# Patient Record
Sex: Female | Born: 1960 | Race: Black or African American | Hispanic: No | Marital: Single | Smoking: Former smoker
Health system: Southern US, Community
[De-identification: ages and names within clinical notes are randomized; demographics above are authoritative.]

## PROBLEM LIST (undated history)

## (undated) DIAGNOSIS — M545 Low back pain, unspecified: Secondary | ICD-10-CM

## (undated) DIAGNOSIS — J45909 Unspecified asthma, uncomplicated: Secondary | ICD-10-CM

## (undated) DIAGNOSIS — K759 Inflammatory liver disease, unspecified: Secondary | ICD-10-CM

## (undated) DIAGNOSIS — I428 Other cardiomyopathies: Secondary | ICD-10-CM

## (undated) DIAGNOSIS — G8929 Other chronic pain: Secondary | ICD-10-CM

## (undated) DIAGNOSIS — B1920 Unspecified viral hepatitis C without hepatic coma: Secondary | ICD-10-CM

## (undated) DIAGNOSIS — E538 Deficiency of other specified B group vitamins: Secondary | ICD-10-CM

## (undated) DIAGNOSIS — R7303 Prediabetes: Secondary | ICD-10-CM

## (undated) DIAGNOSIS — Z6835 Body mass index (BMI) 35.0-35.9, adult: Secondary | ICD-10-CM

## (undated) DIAGNOSIS — K219 Gastro-esophageal reflux disease without esophagitis: Secondary | ICD-10-CM

## (undated) DIAGNOSIS — R634 Abnormal weight loss: Secondary | ICD-10-CM

## (undated) DIAGNOSIS — E559 Vitamin D deficiency, unspecified: Secondary | ICD-10-CM

## (undated) DIAGNOSIS — S300XXA Contusion of lower back and pelvis, initial encounter: Secondary | ICD-10-CM

## (undated) DIAGNOSIS — F419 Anxiety disorder, unspecified: Secondary | ICD-10-CM

## (undated) DIAGNOSIS — I509 Heart failure, unspecified: Secondary | ICD-10-CM

## (undated) DIAGNOSIS — F1911 Other psychoactive substance abuse, in remission: Secondary | ICD-10-CM

## (undated) DIAGNOSIS — I10 Essential (primary) hypertension: Secondary | ICD-10-CM

## (undated) DIAGNOSIS — F99 Mental disorder, not otherwise specified: Secondary | ICD-10-CM

## (undated) DIAGNOSIS — R7989 Other specified abnormal findings of blood chemistry: Secondary | ICD-10-CM

## (undated) DIAGNOSIS — R5381 Other malaise: Secondary | ICD-10-CM

## (undated) DIAGNOSIS — R296 Repeated falls: Secondary | ICD-10-CM

## (undated) DIAGNOSIS — F209 Schizophrenia, unspecified: Secondary | ICD-10-CM

## (undated) DIAGNOSIS — J449 Chronic obstructive pulmonary disease, unspecified: Secondary | ICD-10-CM

## (undated) HISTORY — PX: TUBAL LIGATION: SHX77

## (undated) HISTORY — PX: OTHER SURGICAL HISTORY: SHX169

## (undated) HISTORY — PX: COLONOSCOPY: SHX174

---

## 2013-10-30 ENCOUNTER — Inpatient Hospital Stay: Payer: Self-pay | Admitting: Psychiatry

## 2013-10-30 LAB — URINALYSIS, COMPLETE
Bilirubin,UR: NEGATIVE
Glucose,UR: NEGATIVE mg/dL (ref 0–75)
Ketone: NEGATIVE
Nitrite: NEGATIVE
Ph: 7 (ref 4.5–8.0)
Protein: NEGATIVE
RBC,UR: 1 /HPF (ref 0–5)
Specific Gravity: 1.006 (ref 1.003–1.030)
Squamous Epithelial: 6
WBC UR: 7 /HPF (ref 0–5)

## 2013-10-30 LAB — COMPREHENSIVE METABOLIC PANEL
Albumin: 4.2 g/dL (ref 3.4–5.0)
Alkaline Phosphatase: 80 U/L
Anion Gap: 5 — ABNORMAL LOW (ref 7–16)
BUN: 10 mg/dL (ref 7–18)
Bilirubin,Total: 0.5 mg/dL (ref 0.2–1.0)
Calcium, Total: 9.9 mg/dL (ref 8.5–10.1)
Chloride: 106 mmol/L (ref 98–107)
Co2: 26 mmol/L (ref 21–32)
Creatinine: 0.66 mg/dL (ref 0.60–1.30)
EGFR (African American): >60
EGFR (Non-African Amer.): >60
Glucose: 105 mg/dL — ABNORMAL HIGH (ref 65–99)
Osmolality: 273 (ref 275–301)
Potassium: 3.9 mmol/L (ref 3.5–5.1)
SGOT(AST): 31 U/L (ref 15–37)
SGPT (ALT): 32 U/L (ref 12–78)
Sodium: 137 mmol/L (ref 136–145)
Total Protein: 8 g/dL (ref 6.4–8.2)

## 2013-10-30 LAB — DRUG SCREEN, URINE
Barbiturates, Ur Screen: NEGATIVE (ref ?–200)
MDMA (Ecstasy)Ur Screen: NEGATIVE (ref ?–500)
Methadone, Ur Screen: NEGATIVE (ref ?–300)
Opiate, Ur Screen: NEGATIVE (ref ?–300)
Phencyclidine (PCP) Ur S: NEGATIVE (ref ?–25)
Tricyclic, Ur Screen: NEGATIVE (ref ?–1000)

## 2013-10-30 LAB — CBC
HCT: 42.3 % (ref 35.0–47.0)
HGB: 13.8 g/dL (ref 12.0–16.0)
MCH: 28.7 pg (ref 26.0–34.0)
MCHC: 32.6 g/dL (ref 32.0–36.0)
MCV: 88 fL (ref 80–100)
Platelet: 274 10*3/uL (ref 150–440)
RBC: 4.82 10*6/uL (ref 3.80–5.20)
RDW: 14.4 % (ref 11.5–14.5)
WBC: 12.6 10*3/uL — ABNORMAL HIGH (ref 3.6–11.0)

## 2013-10-30 LAB — SALICYLATE LEVEL: Salicylates, Serum: 3.4 mg/dL — ABNORMAL HIGH

## 2013-10-30 LAB — ETHANOL: Ethanol: <3 mg/dL

## 2013-10-30 LAB — ACETAMINOPHEN LEVEL: Acetaminophen: <2 ug/mL

## 2015-02-28 NOTE — Consult Note (Signed)
Brief Consult Note: Diagnosis: schizophrenia.   Patient was seen by consultant.   Consult note dictated.   Orders entered.   Comments: Psychiatry: PAtient seen chart reviewed and full note dictated. Admitted to [facility] for symptom stabilization after recent stress produced SI and HI. Orders done.  Electronic Signatures: Octavia Velador, Madie Reno (MD)  (Signed 24-Dec-14 15:54)  Authored: Brief Consult Note   Last Updated: 24-Dec-14 15:54 by Gonzella Lex (MD)

## 2015-02-28 NOTE — Discharge Summary (Signed)
PATIENT NAME:  Gloria Perez, Gloria Perez MR#:  [id number] DATE OF BIRTH:  1961-10-30  DATE OF ADMISSION:  10/30/2013 DATE OF DISCHARGE:  11/05/2013  HOSPITAL COURSE: See dictated history and physical for details of admission. This 54 year old woman with a history of schizophrenia came into the hospital reporting depressed mood and suicidal ideation and worsening hallucinations, all of which seemed to be related to the stress of her boyfriend breaking up with her. In the hospital, she initially was very withdrawn, did not go to groups much, did not interact much, cried a lot, complained of hallucinations a lot. She has been continued on her outpatient psychiatric medicines. Supportive and educational therapy have been done. The patient has shown gradual improvement and, for the last couple of days now, she denies any suicidal ideation and reports that her hallucinations are better. She is agreeable to going back to her group home. We really did not have to change anything about her medication significantly. Blood pressure has been stable. The patient will be followed up by the [place] team still back in the community.   MENTAL STATUS EXAM AT DISCHARGE: Somewhat disheveled but better than when she came in woman who looks her stated age or younger. Eye contact normal. Psychomotor activity improved. Speech decreased in total amount but still better than it was a couple of days ago. Affect is smiling and upbeat. Mood is stated as okay. Thoughts are slow and slightly disorganized but not grossly bizarre. No obvious delusions. Denies suicidal or homicidal ideation. Shows improved insight and judgment. Intelligence probably chronically impaired. Alert and oriented x 4, however.   DISCHARGE MEDICATIONS: Lisinopril 20 mg once a day, promethazine 25 mg every 4 to 6 hours as needed for nausea, benztropine 1 mg twice a day, fluphenazine 10 mg once a day at night and Toprol 40 mg once a day.   LABORATORY RESULTS: Blood  glucoses have been monitored throughout her hospital stay and have largely been in the low to mid 100s. She had reported to me very clearly that she was no longer supposed to be taking her diabetes medicine. Salicylates elevated slightly at 3.4 on admission. Alcohol negative. Chemistry panel elevated glucose 105. CBC elevated white count at 12.6. Urinalysis likely urinary tract infection which was treated during her hospital stay. Drug screen negative.   DISPOSITION: Discharged back to her group home. Follow up with the [place] team.   DIAGNOSIS, PRINCIPAL AND PRIMARY:  AXIS I: Schizophrenia, undifferentiated.   SECONDARY DIAGNOSES: AXIS I: No further diagnosis.  AXIS II: Deferred.  AXIS III: High blood pressure, history of diabetes, currently off medication; obesity, urinary tract infection, improved; gastric reflux symptoms.  AXIS IV: Severe from chronic illness and recent breakup.  AXIS V: Functioning at time of discharge 83.    ____________________________ Gonzella Lex, MD jtc:cs D: 11/05/2013 14:35:59 ET T: 11/05/2013 15:25:05 ET JOB#: [id number]  cc: Gonzella Lex, MD, <Dictator> Gonzella Lex MD ELECTRONICALLY SIGNED 11/06/2013 14:12
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
## 2017-08-17 ENCOUNTER — Encounter (INDEPENDENT_AMBULATORY_CARE_PROVIDER_SITE_OTHER): Payer: Self-pay | Admitting: *Deleted

## 2017-08-17 ENCOUNTER — Other Ambulatory Visit (HOSPITAL_COMMUNITY)
Admission: RE | Admit: 2017-08-17 | Discharge: 2017-08-17 | Disposition: A | Discharge status: Home / Self Care | Payer: Medicare (Managed Care) | Source: Ambulatory Visit | Attending: Internal Medicine | Admitting: Internal Medicine

## 2017-08-17 DIAGNOSIS — F319 Bipolar disorder, unspecified: Secondary | ICD-10-CM | POA: Insufficient documentation

## 2017-08-17 DIAGNOSIS — E1121 Type 2 diabetes mellitus with diabetic nephropathy: Secondary | ICD-10-CM | POA: Insufficient documentation

## 2017-08-17 DIAGNOSIS — E782 Mixed hyperlipidemia: Secondary | ICD-10-CM | POA: Insufficient documentation

## 2017-08-17 LAB — CBC WITH DIFFERENTIAL/PLATELET
Basophils Absolute: 0 10*3/uL (ref 0.0–0.1)
Basophils Relative: 0 %
Eosinophils Absolute: 0.3 10*3/uL (ref 0.0–0.7)
Eosinophils Relative: 2 %
HEMATOCRIT: 42.7 % (ref 36.0–46.0)
HEMOGLOBIN: 14.7 g/dL (ref 12.0–15.0)
LYMPHS ABS: 3.3 10*3/uL (ref 0.7–4.0)
Lymphocytes Relative: 28 %
MCH: 30.9 pg (ref 26.0–34.0)
MCHC: 34.4 g/dL (ref 30.0–36.0)
MCV: 89.9 fL (ref 78.0–100.0)
MONOS PCT: 7 %
Monocytes Absolute: 0.9 10*3/uL (ref 0.1–1.0)
NEUTROS ABS: 7.4 10*3/uL (ref 1.7–7.7)
Neutrophils Relative %: 63 %
Platelets: 268 10*3/uL (ref 150–400)
RBC: 4.75 MIL/uL (ref 3.87–5.11)
RDW: 14.3 % (ref 11.5–15.5)
WBC: 11.8 10*3/uL — ABNORMAL HIGH (ref 4.0–10.5)

## 2017-08-17 LAB — LIPID PANEL
Cholesterol: 121 mg/dL (ref 0–200)
HDL: 48 mg/dL (ref >40)
LDL CALC: 58 mg/dL (ref 0–99)
Total CHOL/HDL Ratio: 2.5 RATIO
Triglycerides: 74 mg/dL (ref <150)
VLDL: 15 mg/dL (ref 0–40)

## 2017-08-17 LAB — COMPREHENSIVE METABOLIC PANEL
ALBUMIN: 4.7 g/dL (ref 3.5–5.0)
ALT: 39 U/L (ref 14–54)
ANION GAP: 15 (ref 5–15)
AST: 45 U/L — ABNORMAL HIGH (ref 15–41)
Alkaline Phosphatase: 76 U/L (ref 38–126)
BUN: 14 mg/dL (ref 6–20)
CHLORIDE: 95 mmol/L — AB (ref 101–111)
CO2: 26 mmol/L (ref 22–32)
Calcium: 9.6 mg/dL (ref 8.9–10.3)
Creatinine, Ser: 0.87 mg/dL (ref 0.44–1.00)
GFR calc non Af Amer: >60 mL/min (ref >60)
GLUCOSE: 125 mg/dL — AB (ref 65–99)
Potassium: 3.3 mmol/L — ABNORMAL LOW (ref 3.5–5.1)
SODIUM: 136 mmol/L (ref 135–145)
Total Bilirubin: 1 mg/dL (ref 0.3–1.2)
Total Protein: 8.7 g/dL — ABNORMAL HIGH (ref 6.5–8.1)

## 2017-08-18 LAB — VITAMIN D 25 HYDROXY (VIT D DEFICIENCY, FRACTURES): Vit D, 25-Hydroxy: 64.4 ng/mL (ref 30.0–100.0)

## 2017-09-01 ENCOUNTER — Ambulatory Visit (HOSPITAL_COMMUNITY): Payer: Medicare (Managed Care)

## 2017-09-01 ENCOUNTER — Other Ambulatory Visit (HOSPITAL_COMMUNITY): Payer: Self-pay | Admitting: Family Medicine

## 2017-09-01 DIAGNOSIS — Z1231 Encounter for screening mammogram for malignant neoplasm of breast: Secondary | ICD-10-CM

## 2017-09-19 ENCOUNTER — Ambulatory Visit (HOSPITAL_COMMUNITY): Payer: Medicare (Managed Care)

## 2017-09-22 ENCOUNTER — Ambulatory Visit (HOSPITAL_COMMUNITY): Payer: Medicare (Managed Care)

## 2017-12-29 ENCOUNTER — Other Ambulatory Visit (HOSPITAL_COMMUNITY): Payer: Self-pay | Admitting: Family Medicine

## 2017-12-29 DIAGNOSIS — Z1239 Encounter for other screening for malignant neoplasm of breast: Secondary | ICD-10-CM

## 2018-02-14 ENCOUNTER — Other Ambulatory Visit (HOSPITAL_COMMUNITY): Payer: Self-pay | Admitting: Family Medicine

## 2018-02-14 DIAGNOSIS — B1920 Unspecified viral hepatitis C without hepatic coma: Secondary | ICD-10-CM

## 2018-02-21 ENCOUNTER — Encounter: Payer: Self-pay | Admitting: Emergency Medicine

## 2018-02-22 ENCOUNTER — Ambulatory Visit
Admission: RE | Admit: 2018-02-22 | Discharge: 2018-02-22 | Disposition: A | Discharge status: Home / Self Care | Payer: Medicare (Managed Care) | Source: Ambulatory Visit | Attending: Internal Medicine | Admitting: Internal Medicine

## 2018-02-22 ENCOUNTER — Encounter: Payer: Self-pay | Admitting: *Deleted

## 2018-02-22 ENCOUNTER — Ambulatory Visit: Payer: Medicare (Managed Care) | Admitting: Anesthesiology

## 2018-02-22 ENCOUNTER — Encounter
Admission: RE | Disposition: A | Discharge status: Home / Self Care | Payer: Self-pay | Source: Ambulatory Visit | Attending: Internal Medicine

## 2018-02-22 DIAGNOSIS — Z7951 Long term (current) use of inhaled steroids: Secondary | ICD-10-CM | POA: Diagnosis not present

## 2018-02-22 DIAGNOSIS — I5030 Unspecified diastolic (congestive) heart failure: Secondary | ICD-10-CM | POA: Diagnosis not present

## 2018-02-22 DIAGNOSIS — Z791 Long term (current) use of non-steroidal anti-inflammatories (NSAID): Secondary | ICD-10-CM | POA: Diagnosis not present

## 2018-02-22 DIAGNOSIS — Z1211 Encounter for screening for malignant neoplasm of colon: Secondary | ICD-10-CM | POA: Insufficient documentation

## 2018-02-22 DIAGNOSIS — Z538 Procedure and treatment not carried out for other reasons: Secondary | ICD-10-CM | POA: Insufficient documentation

## 2018-02-22 DIAGNOSIS — J449 Chronic obstructive pulmonary disease, unspecified: Secondary | ICD-10-CM | POA: Diagnosis not present

## 2018-02-22 DIAGNOSIS — I110 Hypertensive heart disease with heart failure: Secondary | ICD-10-CM | POA: Insufficient documentation

## 2018-02-22 DIAGNOSIS — F17200 Nicotine dependence, unspecified, uncomplicated: Secondary | ICD-10-CM | POA: Insufficient documentation

## 2018-02-22 DIAGNOSIS — K219 Gastro-esophageal reflux disease without esophagitis: Secondary | ICD-10-CM | POA: Diagnosis not present

## 2018-02-22 DIAGNOSIS — Z79899 Other long term (current) drug therapy: Secondary | ICD-10-CM | POA: Diagnosis not present

## 2018-02-22 HISTORY — DX: Prediabetes: R73.03

## 2018-02-22 HISTORY — DX: Other cardiomyopathies: I42.8

## 2018-02-22 HISTORY — DX: Unspecified asthma, uncomplicated: J45.909

## 2018-02-22 HISTORY — DX: Schizophrenia, unspecified: F20.9

## 2018-02-22 HISTORY — DX: Vitamin D deficiency, unspecified: E55.9

## 2018-02-22 HISTORY — DX: Repeated falls: R29.6

## 2018-02-22 HISTORY — DX: Other malaise: R53.81

## 2018-02-22 HISTORY — DX: Morbid (severe) obesity due to excess calories: E66.01

## 2018-02-22 HISTORY — DX: Deficiency of other specified B group vitamins: E53.8

## 2018-02-22 HISTORY — DX: Contusion of lower back and pelvis, initial encounter: S30.0XXA

## 2018-02-22 HISTORY — DX: Gastro-esophageal reflux disease without esophagitis: K21.9

## 2018-02-22 HISTORY — DX: Low back pain, unspecified: M54.50

## 2018-02-22 HISTORY — DX: Other chronic pain: G89.29

## 2018-02-22 HISTORY — DX: Low back pain: M54.5

## 2018-02-22 HISTORY — DX: Heart failure, unspecified: I50.9

## 2018-02-22 HISTORY — PX: COLONOSCOPY WITH PROPOFOL: SHX5780

## 2018-02-22 HISTORY — DX: Other specified abnormal findings of blood chemistry: R79.89

## 2018-02-22 HISTORY — DX: Inflammatory liver disease, unspecified: K75.9

## 2018-02-22 HISTORY — DX: Chronic obstructive pulmonary disease, unspecified: J44.9

## 2018-02-22 HISTORY — DX: Essential (primary) hypertension: I10

## 2018-02-22 HISTORY — DX: Body mass index (BMI) 35.0-35.9, adult: Z68.35

## 2018-02-22 HISTORY — DX: Abnormal weight loss: R63.4

## 2018-02-22 SURGERY — COLONOSCOPY WITH PROPOFOL
Anesthesia: General

## 2018-02-22 MED ORDER — LIDOCAINE HCL (PF) 2 % IJ SOLN
INTRAMUSCULAR | Status: AC
  Filled 2018-02-22: qty 10

## 2018-02-22 MED ORDER — SODIUM CHLORIDE 0.9 % IV SOLN
INTRAVENOUS | Status: DC
  Administered 2018-02-22: 1000 mL via INTRAVENOUS

## 2018-02-22 MED ORDER — PROPOFOL 500 MG/50ML IV EMUL
INTRAVENOUS | Status: AC
  Filled 2018-02-22: qty 50

## 2018-02-22 NOTE — Anesthesia Postprocedure Evaluation (Signed)
Anesthesia Post Note  Patient: Gloria Perez  Procedure(s) Performed: COLONOSCOPY WITH PROPOFOL (N/A )  Patient location during evaluation: Endoscopy Anesthesia Type: General Level of consciousness: awake and alert and oriented Pain management: pain level controlled Vital Signs Assessment: post-procedure vital signs reviewed and stable Respiratory status: spontaneous breathing, nonlabored ventilation and respiratory function stable Cardiovascular status: blood pressure returned to baseline and stable Postop Assessment: no signs of nausea or vomiting Anesthetic complications: no     Last Vitals:  Vitals:   02/22/18 1236 02/22/18 1327  BP: 96/81 122/68  Pulse: 99 90  Resp: 18 16  Temp: (!) 36.2 C   SpO2: 100% 100%    Last Pain:  Vitals:   02/22/18 1236  TempSrc: Tympanic  PainSc: 0-No pain                 Shanora Christensen

## 2018-02-22 NOTE — Anesthesia Preprocedure Evaluation (Addendum)
Anesthesia Evaluation  Patient identified by MRN, date of birth, ID band Patient awake    Reviewed: Allergy & Precautions, NPO status , Patient's Chart, lab work & pertinent test results  History of Anesthesia Complications Negative for: history of anesthetic complications  Airway Mallampati: II  TM Distance: >3 FB Neck ROM: Full    Dental  (+) Edentulous Upper, Edentulous Lower   Pulmonary asthma , COPD, Current Smoker,    breath sounds clear to auscultation- rhonchi (-) wheezing      Cardiovascular hypertension, Pt. on medications +CHF  (-) CAD, (-) Past MI, (-) Cardiac Stents and (-) CABG  Rhythm:Regular Rate:Normal - Systolic murmurs and - Diastolic murmurs Echo 32/35/57: MILD LV DYSFUNCTION (EF 50%) WITH MILD LVH NORMAL LA PRESSURES WITH DIASTOLIC DYSFUNCTION NORMAL RIGHT VENTRICULAR SYSTOLIC FUNCTION VALVULAR REGURGITATION: MILD AR, MILD MR, MILD PR, TRIVIAL TR NO VALVULAR STENOSIS   Neuro/Psych PSYCHIATRIC DISORDERS Schizophrenia negative neurological ROS     GI/Hepatic Neg liver ROS, GERD  ,  Endo/Other  negative endocrine ROSneg diabetes  Renal/GU negative Renal ROS     Musculoskeletal negative musculoskeletal ROS (+)   Abdominal (+) - obese,   Peds  Hematology negative hematology ROS (+)   Anesthesia Other Findings Past Medical History: No date: Abnormal thyroid blood test No date: Acute left-sided low back pain without sciatica No date: Asthma No date: B12 deficiency No date: CHF (congestive heart failure) (HCC) No date: Chronic pain No date: Contusion of lower back No date: COPD (chronic obstructive pulmonary disease) (HCC) No date: Falling episodes No date: GERD (gastroesophageal reflux disease) No date: Hepatitis No date: Hypertension No date: Nonischemic cardiomyopathy (HCC) No date: Physical debility No date: Pre-diabetes No date: Schizophrenia ([place]) No date: Severe obesity  (BMI 35.0-35.9 with comorbidity) ([place]) No date: Vitamin D deficiency No date: Weight loss   Reproductive/Obstetrics                            Anesthesia Physical Anesthesia Plan  ASA: III  Anesthesia Plan: General   Post-op Pain Management:    Induction: Intravenous  PONV Risk Score and Plan: 1 and Propofol infusion  Airway Management Planned: Natural Airway  Additional Equipment:   Intra-op Plan:   Post-operative Plan:   Informed Consent: I have reviewed the patients History and Physical, chart, labs and discussed the procedure including the risks, benefits and alternatives for the proposed anesthesia with the patient or authorized representative who has indicated his/her understanding and acceptance.   Dental advisory given  Plan Discussed with: CRNA and Anesthesiologist  Anesthesia Plan Comments:         Anesthesia Quick Evaluation

## 2018-02-22 NOTE — Anesthesia Post-op Follow-up Note (Signed)
Anesthesia QCDR form completed.        

## 2018-02-22 NOTE — Interval H&P Note (Signed)
History and Physical Interval Note:  02/22/2018 12:59 PM  Gloria Perez  has presented today for surgery, with the diagnosis of CCA SCREEN  The various methods of treatment have been discussed with the patient and family. After consideration of risks, benefits and other options for treatment, the patient has consented to  Procedure(s): COLONOSCOPY WITH PROPOFOL (N/A) as a surgical intervention .  The patient's history has been reviewed, patient examined, no change in status, stable for surgery.  I have reviewed the patient's chart and labs.  Questions were answered to the patient's satisfaction.     [place], [place]

## 2018-02-22 NOTE — Progress Notes (Addendum)
No recovery needed for Colonoscopy due to no sedation or anesthesia. Patient not cleaned out for colonoscopy. Spoke with Clarene Critchley on phone and she will come to pick her up.

## 2018-02-22 NOTE — H&P (Signed)
Outpatient short stay form Pre-procedure 02/22/2018 12:58 PM Divit Stipp K. Alice Reichert, M.D.  Primary Physician: Curtis Sites, M.D.  Reason for visit:  Colon cancer screening.  History of present illness:  Patient presents for colonoscopy for screening. The patient denies complaints of abdominal pain, significant change in bowel habits, or rectal bleeding.     Current Facility-Administered Medications:  .  0.9 %  sodium chloride infusion, , Intravenous, Continuous, Charlissa Petros, Benay Pike, MD  Medications Prior to Admission  Medication Sig Dispense Refill Last Dose  . atorvastatin (LIPITOR) 40 MG tablet Take 40 mg by mouth daily.     . benztropine (COGENTIN) 0.5 MG tablet Take 0.5 mg by mouth 2 (two) times daily.   02/22/2018 at Unknown time  . buPROPion (ZYBAN) 150 MG 12 hr tablet Take 150 mg by mouth 2 (two) times daily.   02/22/2018 at Unknown time  . cholecalciferol (VITAMIN D) 400 units TABS tablet Take 1,000 Units by mouth daily.   02/22/2018 at Unknown time  . dicyclomine (BENTYL) 20 MG tablet Take 20 mg by mouth every 6 (six) hours.     . DULoxetine (CYMBALTA) 30 MG capsule Take 30 mg by mouth daily.     Marland Kitchen FLUoxetine (PROZAC) 10 MG capsule Take 10 mg by mouth daily.   02/22/2018 at Unknown time  . fluPHENAZine decanoate (PROLIXIN) 25 MG/ML injection Inject 25 mg into the muscle every 21 ( twenty-one) days.     . fluticasone (FLOVENT HFA) 110 MCG/ACT inhaler Inhale 1 puff into the lungs 2 (two) times daily.     . furosemide (LASIX) 40 MG tablet Take 40 mg by mouth daily.     Marland Kitchen gabapentin (NEURONTIN) 300 MG capsule Take 300 mg by mouth 3 (three) times daily.     . hydrOXYzine (ATARAX/VISTARIL) 25 MG tablet Take 25 mg by mouth 3 (three) times daily as needed.     . lamoTRIgine (LAMICTAL) 25 MG tablet Take 25 mg by mouth 2 (two) times daily.   02/22/2018 at Unknown time  . lisinopril (PRINIVIL,ZESTRIL) 5 MG tablet Take 5 mg by mouth daily.   02/22/2018 at Unknown time  . naproxen (NAPROSYN) 500  MG tablet Take 500 mg by mouth 2 (two) times daily with a meal.     . omeprazole (PRILOSEC) 20 MG capsule Take 20 mg by mouth daily.   02/22/2018 at Unknown time  . ondansetron (ZOFRAN) 4 MG tablet Take 4 mg by mouth every 8 (eight) hours as needed for nausea or vomiting.     . polyethylene glycol (MIRALAX / GLYCOLAX) packet Take 17 g by mouth daily.     Marland Kitchen spironolactone (ALDACTONE) 50 MG tablet Take 50 mg by mouth daily.     Marland Kitchen tiotropium (SPIRIVA) 18 MCG inhalation capsule Place 18 mcg into inhaler and inhale daily.   02/22/2018 at Unknown time  . traZODone (DESYREL) 50 MG tablet Take 50 mg by mouth at bedtime.   02/22/2018 at Unknown time     Allergies  Allergen Reactions  . Lisinopril   . Metformin And Related      Past Medical History:  Diagnosis Date  . Abnormal thyroid blood test   . Acute left-sided low back pain without sciatica   . Asthma   . B12 deficiency   . CHF (congestive heart failure) ([place])   . Chronic pain   . Contusion of lower back   . COPD (chronic obstructive pulmonary disease) ([place])   . Falling episodes   . GERD (gastroesophageal reflux  disease)   . Hepatitis   . Hypertension   . Nonischemic cardiomyopathy ([place])   . Physical debility   . Pre-diabetes   . Schizophrenia ([place])   . Severe obesity (BMI 35.0-35.9 with comorbidity) ([place])   . Vitamin D deficiency   . Weight loss     Review of systems:   Otherwise negative.    Physical Exam  Gen: Alert, oriented. Appears stated age.  HEENT: [place]/AT. PERRLA. Lungs: CTA, no wheezes. CV: RR nl S1, S2. Abd: soft, benign, no masses. BS+ Ext: No edema. Pulses 2+    Planned procedures: Colonoscopy. The patient understands the nature of the planned procedure, indications, risks, alternatives and potential complications including but not limited to bleeding, infection, perforation, damage to internal organs and possible oversedation/side effects from anesthesia. The patient agrees and gives consent to proceed.   Please refer to procedure notes for findings, recommendations and patient disposition/instructions.    Dora Clauss K. Alice Reichert, M.D. Gastroenterology 02/22/2018  12:58 PM

## 2018-02-22 NOTE — Transfer of Care (Signed)
Immediate Anesthesia Transfer of Care Note  Patient: Gloria Perez  Procedure(s) Performed: COLONOSCOPY WITH PROPOFOL (N/A )  Patient Location: PACU  Anesthesia Type:General  Level of Consciousness: awake, alert , oriented and patient cooperative  Airway & Oxygen Therapy: Patient Spontanous Breathing  Post-op Assessment: Report given to RN, Post -op Vital signs reviewed and stable and Patient moving all extremities  Post vital signs: Reviewed and stable  Last Vitals:  Vitals Value Taken Time  BP    Temp    Pulse    Resp    SpO2      Last Pain:  Vitals:   02/22/18 1236  TempSrc: Tympanic  PainSc: 0-No pain         Complications: No apparent anesthesia complications and Procedure cancelled d/t poor prep

## 2018-03-16 ENCOUNTER — Other Ambulatory Visit: Payer: Self-pay | Admitting: Family Medicine

## 2018-03-16 DIAGNOSIS — B1920 Unspecified viral hepatitis C without hepatic coma: Secondary | ICD-10-CM

## 2018-03-20 ENCOUNTER — Encounter: Payer: Self-pay | Admitting: Radiology

## 2018-03-20 ENCOUNTER — Ambulatory Visit
Admission: RE | Admit: 2018-03-20 | Discharge: 2018-03-20 | Disposition: A | Discharge status: Home / Self Care | Payer: PRIVATE HEALTH INSURANCE | Source: Ambulatory Visit | Attending: Family Medicine | Admitting: Family Medicine

## 2018-03-20 DIAGNOSIS — Z1231 Encounter for screening mammogram for malignant neoplasm of breast: Secondary | ICD-10-CM | POA: Diagnosis present

## 2018-03-20 DIAGNOSIS — Z1239 Encounter for other screening for malignant neoplasm of breast: Secondary | ICD-10-CM

## 2018-03-20 DIAGNOSIS — B1920 Unspecified viral hepatitis C without hepatic coma: Secondary | ICD-10-CM

## 2018-03-21 ENCOUNTER — Ambulatory Visit
Admission: RE | Admit: 2018-03-21 | Discharge: 2018-03-21 | Disposition: A | Discharge status: Home / Self Care | Payer: Medicare (Managed Care) | Source: Ambulatory Visit | Attending: Family Medicine | Admitting: Family Medicine

## 2018-03-21 DIAGNOSIS — N2889 Other specified disorders of kidney and ureter: Secondary | ICD-10-CM | POA: Insufficient documentation

## 2018-03-21 DIAGNOSIS — B1920 Unspecified viral hepatitis C without hepatic coma: Secondary | ICD-10-CM | POA: Diagnosis not present

## 2018-03-28 ENCOUNTER — Other Ambulatory Visit: Payer: Self-pay | Admitting: Family Medicine

## 2018-03-28 ENCOUNTER — Ambulatory Visit
Admission: RE | Admit: 2018-03-28 | Payer: Medicare (Managed Care) | Source: Ambulatory Visit | Admitting: Internal Medicine

## 2018-03-28 ENCOUNTER — Encounter: Admission: RE | Payer: Self-pay | Source: Ambulatory Visit

## 2018-03-28 DIAGNOSIS — N2889 Other specified disorders of kidney and ureter: Secondary | ICD-10-CM

## 2018-03-28 SURGERY — COLONOSCOPY WITH PROPOFOL
Anesthesia: General

## 2018-04-05 ENCOUNTER — Ambulatory Visit
Admission: RE | Admit: 2018-04-05 | Discharge: 2018-04-05 | Disposition: A | Discharge status: Home / Self Care | Payer: Medicare (Managed Care) | Source: Ambulatory Visit | Attending: Family Medicine | Admitting: Family Medicine

## 2018-04-05 ENCOUNTER — Other Ambulatory Visit: Payer: Self-pay

## 2018-04-05 ENCOUNTER — Emergency Department
Admission: EM | Admit: 2018-04-05 | Discharge: 2018-04-05 | Disposition: A | Discharge status: Home / Self Care | Payer: Medicare (Managed Care) | Attending: Emergency Medicine | Admitting: Emergency Medicine

## 2018-04-05 DIAGNOSIS — F203 Undifferentiated schizophrenia: Secondary | ICD-10-CM

## 2018-04-05 DIAGNOSIS — I509 Heart failure, unspecified: Secondary | ICD-10-CM | POA: Diagnosis not present

## 2018-04-05 DIAGNOSIS — Z7984 Long term (current) use of oral hypoglycemic drugs: Secondary | ICD-10-CM | POA: Diagnosis not present

## 2018-04-05 DIAGNOSIS — F209 Schizophrenia, unspecified: Secondary | ICD-10-CM

## 2018-04-05 DIAGNOSIS — N2889 Other specified disorders of kidney and ureter: Secondary | ICD-10-CM | POA: Insufficient documentation

## 2018-04-05 DIAGNOSIS — D259 Leiomyoma of uterus, unspecified: Secondary | ICD-10-CM | POA: Insufficient documentation

## 2018-04-05 DIAGNOSIS — I110 Hypertensive heart disease with heart failure: Secondary | ICD-10-CM | POA: Diagnosis not present

## 2018-04-05 DIAGNOSIS — Z79899 Other long term (current) drug therapy: Secondary | ICD-10-CM | POA: Insufficient documentation

## 2018-04-05 DIAGNOSIS — J449 Chronic obstructive pulmonary disease, unspecified: Secondary | ICD-10-CM | POA: Insufficient documentation

## 2018-04-05 DIAGNOSIS — F17210 Nicotine dependence, cigarettes, uncomplicated: Secondary | ICD-10-CM | POA: Insufficient documentation

## 2018-04-05 DIAGNOSIS — Z046 Encounter for general psychiatric examination, requested by authority: Secondary | ICD-10-CM | POA: Diagnosis present

## 2018-04-05 LAB — CBC
HCT: 35.7 % (ref 35.0–47.0)
Hemoglobin: 11.9 g/dL — ABNORMAL LOW (ref 12.0–16.0)
MCH: 29.6 pg (ref 26.0–34.0)
MCHC: 33.2 g/dL (ref 32.0–36.0)
MCV: 89.1 fL (ref 80.0–100.0)
PLATELETS: 310 10*3/uL (ref 150–440)
RBC: 4.01 MIL/uL (ref 3.80–5.20)
RDW: 15.3 % — AB (ref 11.5–14.5)
WBC: 7.5 10*3/uL (ref 3.6–11.0)

## 2018-04-05 LAB — COMPREHENSIVE METABOLIC PANEL
ALBUMIN: 4 g/dL (ref 3.5–5.0)
ALT: 15 U/L (ref 14–54)
AST: 26 U/L (ref 15–41)
Alkaline Phosphatase: 66 U/L (ref 38–126)
Anion gap: 9 (ref 5–15)
BILIRUBIN TOTAL: 0.7 mg/dL (ref 0.3–1.2)
BUN: 11 mg/dL (ref 6–20)
CO2: 26 mmol/L (ref 22–32)
Calcium: 9.3 mg/dL (ref 8.9–10.3)
Chloride: 104 mmol/L (ref 101–111)
Creatinine, Ser: 0.85 mg/dL (ref 0.44–1.00)
GFR calc Af Amer: >60 mL/min (ref >60)
GFR calc non Af Amer: >60 mL/min (ref >60)
GLUCOSE: 95 mg/dL (ref 65–99)
POTASSIUM: 3.4 mmol/L — AB (ref 3.5–5.1)
Sodium: 139 mmol/L (ref 135–145)
TOTAL PROTEIN: 7.3 g/dL (ref 6.5–8.1)

## 2018-04-05 LAB — URINE DRUG SCREEN, QUALITATIVE (ARMC ONLY)
AMPHETAMINES, UR SCREEN: NOT DETECTED
BARBITURATES, UR SCREEN: NOT DETECTED
BENZODIAZEPINE, UR SCRN: NOT DETECTED
Cannabinoid 50 Ng, Ur ~~LOC~~: NOT DETECTED
Cocaine Metabolite,Ur ~~LOC~~: NOT DETECTED
MDMA (Ecstasy)Ur Screen: NOT DETECTED
METHADONE SCREEN, URINE: NOT DETECTED
Opiate, Ur Screen: NOT DETECTED
Phencyclidine (PCP) Ur S: NOT DETECTED
TRICYCLIC, UR SCREEN: NOT DETECTED

## 2018-04-05 LAB — SALICYLATE LEVEL

## 2018-04-05 LAB — ETHANOL

## 2018-04-05 LAB — ACETAMINOPHEN LEVEL: Acetaminophen (Tylenol), Serum: <10 ug/mL — ABNORMAL LOW (ref 10–30)

## 2018-04-05 MED ORDER — GADOBENATE DIMEGLUMINE 529 MG/ML IV SOLN
15.0000 mL | Freq: Once | INTRAVENOUS | Status: AC | PRN
  Administered 2018-04-05: 15 mL via INTRAVENOUS
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
## 2018-04-05 NOTE — ED Notes (Signed)
BEHAVIORAL HEALTH ROUNDING Patient sleeping: No. Patient alert and oriented: yes Behavior appropriate: Yes.  ; If no, describe:  Nutrition and fluids offered: yes Toileting and hygiene offered: Yes  Sitter present: q15 minute observations and security monitoring Law enforcement present: Yes    

## 2018-04-05 NOTE — ED Notes (Signed)
Departure condition and pain entered in error at [place] correct dispo time

## 2018-04-05 NOTE — ED Triage Notes (Addendum)
Pt here with [facility] [phone]) home owner, Marcelo Baldy, [phone] (home), [phone] (cell). States ACT team told pt that they were going to take her to jail if she wasn't evaluated. Pt denies SI or HI. Denies legal guardian. Alert, ambulatory. Pt states she was in medical mall earlier today for a scan. Cooperative in triage. Not making eye contact.

## 2018-04-05 NOTE — ED Provider Notes (Signed)
[facility] Emergency Department Provider Note  ____________________________________________   First MD Initiated Contact with Patient 04/05/18 1745     (approximate)  I have reviewed the triage vital signs and the nursing notes.   HISTORY  Chief Complaint Psychiatric Evaluation  Level 5 caveat:  history/ROS limited by active psychosis / mental illness / altered mental status   HPI Gloria Perez is a 57 y.o. female with a known history of schizophrenia was brought to the emergency department for psychiatric evaluation.  The reasons are somewhat unclear, but apparently he was making some aggressive threatening comments to group home staff.  She is well-known by Dr. Weber Cooks.  She tells me that nothing is bothering her and nothing is wrong, but agrees that she had a disagreement at the group home.  She denies SI and HI.  She says that she has a mild headache, otherwise she has no complaints at this time including denying chest pain, shortness of breath, nausea, vomiting, and abdominal pain.  No additional history is  Obtainable from her due to her chronic psychiatric illness.  Past Medical History:  Diagnosis Date  . Abnormal thyroid blood test   . Acute left-sided low back pain without sciatica   . Asthma   . B12 deficiency   . CHF (congestive heart failure) ([place])   . Chronic pain   . Contusion of lower back   . COPD (chronic obstructive pulmonary disease) ([place])   . Falling episodes   . GERD (gastroesophageal reflux disease)   . Hepatitis   . Hypertension   . Nonischemic cardiomyopathy ([place])   . Physical debility   . Pre-diabetes   . Schizophrenia ([place])   . Severe obesity (BMI 35.0-35.9 with comorbidity) ([place])   . Vitamin D deficiency   . Weight loss     Patient Active Problem List   Diagnosis Date Noted  . Schizophrenia ([place]) 04/05/2018    Past Surgical History:  Procedure Laterality Date  . CESAREAN SECTION    . COLONOSCOPY    .  COLONOSCOPY WITH PROPOFOL N/A 02/22/2018   Procedure: COLONOSCOPY WITH PROPOFOL;  Surgeon: Toledo, Benay Pike, MD;  Location: [facility] ENDOSCOPY;  Service: Gastroenterology;  Laterality: N/A;  . Right arm surgery    . TUBAL LIGATION      Prior to Admission medications   Medication Sig Start Date End Date Taking? Authorizing Provider  atorvastatin (LIPITOR) 40 MG tablet Take 40 mg by mouth daily.    [provider]  benztropine (COGENTIN) 0.5 MG tablet Take 0.5 mg by mouth 2 (two) times daily.    [provider]  buPROPion (ZYBAN) 150 MG 12 hr tablet Take 150 mg by mouth 2 (two) times daily.    [provider]  cholecalciferol (VITAMIN D) 400 units TABS tablet Take 1,000 Units by mouth daily.    [provider]  clotrimazole-betamethasone (LOTRISONE) cream Apply 1 application topically 2 (two) times daily.    [provider]  dicyclomine (BENTYL) 20 MG tablet Take 20 mg by mouth every 6 (six) hours.    [provider]  DULoxetine (CYMBALTA) 30 MG capsule Take 30 mg by mouth daily.    [provider]  FLUoxetine (PROZAC) 10 MG capsule Take 10 mg by mouth daily.    [provider]  fluPHENAZine decanoate (PROLIXIN) 25 MG/ML injection Inject 25 mg into the muscle every 21 ( twenty-one) days.    [provider]  fluticasone (FLOVENT HFA) 110 MCG/ACT inhaler Inhale 1  puff into the lungs 2 (two) times daily.    [provider]  furosemide (LASIX) 40 MG tablet Take 40 mg by mouth daily.    [provider]  gabapentin (NEURONTIN) 300 MG capsule Take 300 mg by mouth 3 (three) times daily.    [provider]  hydrOXYzine (ATARAX/VISTARIL) 25 MG tablet Take 25 mg by mouth 3 (three) times daily as needed.    [provider]  lamoTRIgine (LAMICTAL) 25 MG tablet Take 25 mg by mouth 2 (two) times daily.    [provider]  lisinopril (PRINIVIL,ZESTRIL) 5 MG tablet Take 5 mg by mouth daily.     [provider]  metFORMIN (GLUCOPHAGE) 500 MG tablet Take by mouth 2 (two) times daily with a meal.    [provider]  naproxen (NAPROSYN) 500 MG tablet Take 500 mg by mouth 2 (two) times daily with a meal.    [provider]  omeprazole (PRILOSEC) 20 MG capsule Take 20 mg by mouth daily.    [provider]  ondansetron (ZOFRAN) 4 MG tablet Take 4 mg by mouth every 8 (eight) hours as needed for nausea or vomiting.    [provider]  polyethylene glycol (MIRALAX / GLYCOLAX) packet Take 17 g by mouth daily.    [provider]  spironolactone (ALDACTONE) 50 MG tablet Take 50 mg by mouth daily.    [provider]  tiotropium (SPIRIVA) 18 MCG inhalation capsule Place 18 mcg into inhaler and inhale daily.    [provider]  traZODone (DESYREL) 50 MG tablet Take 50 mg by mouth at bedtime.    [provider]  vitamin B-12 (CYANOCOBALAMIN) 1000 MCG tablet Take 1,000 mcg by mouth daily.    [provider]    Allergies Lisinopril and Metformin and related  Family History  Problem Relation Age of Onset  . Diabetes Mother     Social History Social History   Tobacco Use  . Smoking status: Current Every Day Smoker    Packs/day: 0.50    Years: 35.00    Pack years: 17.50    Types: Cigarettes  . Smokeless tobacco: Never Used  Substance Use Topics  . Alcohol use: Not Currently  . Drug use: Not Currently    Review of Systems Level 5 caveat:  history/ROS limited by active psychosis / mental illness / altered mental status  ____________________________________________   PHYSICAL EXAM:  VITAL SIGNS: ED Triage Vitals [04/05/18 1627]  Enc Vitals Group     BP 139/80     Pulse Rate (!) 103     Resp 18     Temp 98.6 F (37 C)     Temp Source Oral     SpO2 97 %     Weight 64.9 kg (143 lb)     Height 1.524 m (5')     Head Circumference      Peak Flow      Pain Score 0     Pain Loc      Pain Edu?       Excl. in [place]?     Constitutional: Alert and oriented. Well appearing and in no acute distress. Eyes: Conjunctivae are normal.  Head: Atraumatic. Nose: No congestion/rhinnorhea. Mouth/Throat: Mucous membranes are moist.  Edentulous. Neck: No stridor.  No meningeal signs.   Cardiovascular: Triage tachycardia resolved once she was in her room. Regular rhythm. Good peripheral circulation. Grossly normal heart sounds. Respiratory: Normal respiratory effort.  No retractions. Lungs CTAB.  Gastrointestinal: Soft and nontender. No distention.  Musculoskeletal: No lower extremity tenderness nor edema. No gross deformities of extremities. Neurologic:  Normal speech and language. No gross focal neurologic deficits are appreciated.  Skin:  Skin is warm, dry and intact. No rash noted. Psychiatric: Speech is somewhat scattered but she has no concerning thought content, no evidence of hallucinations, no aggressive behavior.  Denies SI/HI.  ____________________________________________   LABS (all labs ordered are listed, but only abnormal results are displayed)  Labs Reviewed  COMPREHENSIVE METABOLIC PANEL - Abnormal; Notable for the following components:      Result Value   Potassium 3.4 (*)    All other components within normal limits  ACETAMINOPHEN LEVEL - Abnormal; Notable for the following components:   Acetaminophen (Tylenol), Serum <10 (*)    All other components within normal limits  CBC - Abnormal; Notable for the following components:   Hemoglobin 11.9 (*)    RDW 15.3 (*)    All other components within normal limits  ETHANOL  SALICYLATE LEVEL  URINE DRUG SCREEN, QUALITATIVE ([facility] ONLY)  PREGNANCY, URINE   ____________________________________________  EKG  None - EKG not ordered by ED physician ____________________________________________  RADIOLOGY   ED MD interpretation: No imaging ordered in emergency department, MRI obtained as an outpatient earlier today was  reassuring with no acute or emergent abnormalities  Official radiology report(s):  (Obtained as an outpatient earlier today, not relevant to current complaint Mr Abdomen W Wo Contrast  Result Date: 04/05/2018 CLINICAL DATA:  Indeterminate renal lesions on abdominal ultrasound. History of hepatitis-C. EXAM: MRI ABDOMEN WITHOUT AND WITH CONTRAST TECHNIQUE: Multiplanar multisequence MR imaging of the abdomen was performed both before and after the administration of intravenous contrast. CONTRAST:  29mL MULTIHANCE GADOBENATE DIMEGLUMINE 529 MG/ML IV SOLN COMPARISON:  Ultrasound 03/21/2018. FINDINGS: Despite efforts by the technologist and patient, mild motion artifact is present on today's exam and could not be eliminated. This reduces exam sensitivity and specificity. Motion is greatest on the post-contrast images. Lower chest:  The visualized lower chest appears unremarkable. Hepatobiliary: The liver demonstrates normal signal and morphology. No focal lesion or abnormal enhancement seen. No evidence of gallstones, gallbladder wall thickening or biliary dilatation. Pancreas: Unremarkable. No pancreatic ductal dilatation or surrounding inflammatory changes. Spleen: Normal in size without focal abnormality. Adrenals/Urinary Tract: Both adrenal glands appear normal. There is a 10 mm cyst in the upper interpolar region of the right kidney which demonstrates low T1 and high T2 signal. Medially in the interpolar region of the left kidney is an 8 mm cyst with similar imaging characteristics. Additional tiny cysts are present bilaterally. No evidence of enhancing renal mass or hydronephrosis. Stomach/Bowel: No evidence of bowel wall thickening, distention or surrounding inflammatory change.Prominent stool noted throughout the colon. Vascular/Lymphatic: There are no enlarged abdominal lymph nodes. No significant vascular findings. Other: Uterine fibroids are partially imaged on the coronal images. Musculoskeletal: No  acute or significant osseous findings. IMPRESSION: 1. The renal lesions seen on ultrasound are cysts and required no specific follow-up. No evidence of enhancing renal mass. 2. No acute abdominal findings. 3. No morphologic changes of cirrhosis or focal hepatic lesions. 4. Uterine fibroids. Electronically Signed   By: Richardean Sale M.D.   On: 04/05/2018 10:31    ____________________________________________   PROCEDURES  Critical Care performed: No   Procedure(s) performed:   Procedures   ____________________________________________   INITIAL IMPRESSION / ASSESSMENT AND PLAN / ED COURSE  As part of my medical decision making, I reviewed the following  data within the [place] notes reviewed and incorporated, Labs reviewed , Old chart reviewed and A consult was requested and obtained from this/these consultant(s) Psychiatry    Differential diagnosis includes, but is not limited to, chronic schizophrenia, mood disorder, adjustment disorder.  The patient has no SI or HI.  She is calm and cooperative at this time and has been at her group home for an extended period time Dr. Weber Cooks knows the patient well and evaluated the patient in the emergency department.  He recommended no adjustment to medications and follow-up with her regular outpatient psychiatry team.  I will discharge her back to her group home.  As per both my assessment to Dr. Weber Cooks assessment, she does not meet any criteria for inpatient behavioral medicine treatment or involuntary commitment.     ____________________________________________  FINAL CLINICAL IMPRESSION(S) / ED DIAGNOSES  Final diagnoses:  Schizophrenia, unspecified type ([place])     MEDICATIONS GIVEN DURING THIS VISIT:  Medications - No data to display   ED Discharge Orders    None       Note:  This document was prepared using Dragon voice recognition software and may include unintentional dictation errors.      Hinda Kehr, MD 04/05/18 [phone]

## 2018-04-05 NOTE — ED Notes (Signed)

## 2018-04-05 NOTE — ED Notes (Signed)
Pt ambulatory to treatment room 20 with [place] EDT

## 2018-04-05 NOTE — ED Notes (Signed)
Gloria Perez, owner of [facility] states pt was threatening to kill others and self yesterday but pt denies SI or HI in triage.

## 2018-04-05 NOTE — ED Notes (Addendum)
Pt changed into behavorial clothing by this tech and RN Anda Kraft), belongings labeled with pt label and put at quad [employer].    Black pair of boots, black wig, pair of white socks, grey and black stretchy pants, black long sleeve shirt, white bra, straw hat, white pair of underwear, black pocket book.

## 2018-04-05 NOTE — Discharge Instructions (Signed)
You have been seen in the Emergency Department (ED) today for a psychiatric complaint.  You have been evaluated by psychiatry (Dr. Weber Cooks) and we believe you are safe to be discharged from the hospital.    Please return to the ED immediately if you have ANY thoughts of hurting yourself or anyone else, so that we may help you.  Please avoid alcohol and drug use.  Follow up with your doctor and/or therapist as soon as possible regarding today's ED visit.   Please follow up any other recommendations and clinic appointments provided by the psychiatry team that saw you in the Emergency Department.

## 2018-04-05 NOTE — Consult Note (Signed)
[facility] Face-to-Face Psychiatry Consult   Reason for Consult: Consult for this 56 year old woman with schizophrenia who came voluntarily to the emergency room Referring Physician: [name] Patient Identification: Gloria Perez MRN:  [id number] Principal Diagnosis: Schizophrenia [facility]) Diagnosis:   Patient Active Problem List   Diagnosis Date Noted  . Schizophrenia ([place]) [F20.9] 04/05/2018    Total Time spent with patient: 1 hour  Subjective:   Gloria Perez is a 57 y.o. female patient admitted with "[employer] set I would go to jail if I did not come here".  HPI: Patient seen chart reviewed.  57 year old woman with a long history of schizophrenia.  Brought here from her group home today.  Notes from triage indicates that there was some talk of her making threatening statements.  To my interview the patient denies any knowledge at all of why she was brought here.  She tells me that [employer] told her that she would go to jail if she did not come to the hospital.  She cannot tell me any reason why they might of threatened her with jail.  Patient says she was having some disagreements with people at her group home today and feels like they talk behind her back but denies that she hit anyone or threatened anyone.  Denies that she talked about doing anything to hurt anyone and denies any thoughts of hurting herself.  Patient says her mood is been fine.  Denies sleep problems.  Denies health problems.  Denies any hallucinations.  Says she is compliant with her medicine.  Does not report any recent stresses.  Medical history: History of thyroid disease B12 deficiency CHF chronic pain  Social history: Has a guardian.  Lives in a group home.  She has been at the same group home for many years.  Substance abuse history: Patient has said in the past that she used to have a substance abuse problem but has not been using any drugs or alcohol any time recently probably not in many years.  Past Psychiatric  History: Patient has a history of schizophrenia.  The last time we saw her in the hospital for psychiatric admission was in 2014.  It looks like she has been through the emergency room at [facility] for a psych complaint a couple years ago but otherwise seems to have been stable outpatient.  There is a very distant history of suicide attempts.  When she gets psychotic she has occasionally gotten hospital but I do not know of any major violence.  Usually maintained on typical antipsychotics.  Risk to Self:   Risk to Others:   Prior Inpatient Therapy:   Prior Outpatient Therapy:    Past Medical History:  Past Medical History:  Diagnosis Date  . Abnormal thyroid blood test   . Acute left-sided low back pain without sciatica   . Asthma   . B12 deficiency   . CHF (congestive heart failure) ([place])   . Chronic pain   . Contusion of lower back   . COPD (chronic obstructive pulmonary disease) ([place])   . Falling episodes   . GERD (gastroesophageal reflux disease)   . Hepatitis   . Hypertension   . Nonischemic cardiomyopathy ([place])   . Physical debility   . Pre-diabetes   . Schizophrenia ([place])   . Severe obesity (BMI 35.0-35.9 with comorbidity) ([place])   . Vitamin D deficiency   . Weight loss     Past Surgical History:  Procedure Laterality Date  . CESAREAN SECTION    .  COLONOSCOPY    . COLONOSCOPY WITH PROPOFOL N/A 02/22/2018   Procedure: COLONOSCOPY WITH PROPOFOL;  Surgeon: Toledo, Benay Pike, MD;  Location: [facility] ENDOSCOPY;  Service: Gastroenterology;  Laterality: N/A;  . Right arm surgery    . TUBAL LIGATION     Family History:  Family History  Problem Relation Age of Onset  . Diabetes Mother    Family Psychiatric  History: None known Social History:  Social History   Substance and Sexual Activity  Alcohol Use Not Currently     Social History   Substance and Sexual Activity  Drug Use Not Currently    Social History   Socioeconomic History  . Marital status: Widowed     Spouse name: Not on file  . Number of children: Not on file  . Years of education: Not on file  . Highest education level: Not on file  Occupational History  . Not on file  Social Needs  . Financial resource strain: Not on file  . Food insecurity:    Worry: Not on file    Inability: Not on file  . Transportation needs:    Medical: Not on file    Non-medical: Not on file  Tobacco Use  . Smoking status: Current Every Day Smoker    Packs/day: 0.50    Years: 35.00    Pack years: 17.50    Types: Cigarettes  . Smokeless tobacco: Never Used  Substance and Sexual Activity  . Alcohol use: Not Currently  . Drug use: Not Currently  . Sexual activity: Not on file  Lifestyle  . Physical activity:    Days per week: Not on file    Minutes per session: Not on file  . Stress: Not on file  Relationships  . Social connections:    Talks on phone: Not on file    Gets together: Not on file    Attends religious service: Not on file    Active member of club or organization: Not on file    Attends meetings of clubs or organizations: Not on file    Relationship status: Not on file  Other Topics Concern  . Not on file  Social History Narrative  . Not on file   Additional Social History:    Allergies:   Allergies  Allergen Reactions  . Lisinopril   . Metformin And Related     Labs:  Results for orders placed or performed during the hospital encounter of 04/05/18 (from the past 48 hour(s))  Comprehensive metabolic panel     Status: Abnormal   Collection Time: 04/05/18  4:29 PM  Result Value Ref Range   Sodium 139 135 - 145 mmol/L   Potassium 3.4 (L) 3.5 - 5.1 mmol/L   Chloride 104 101 - 111 mmol/L   CO2 26 22 - 32 mmol/L   Glucose, Bld 95 65 - 99 mg/dL   BUN 11 6 - 20 mg/dL   Creatinine, Ser 0.85 0.44 - 1.00 mg/dL   Calcium 9.3 8.9 - 10.3 mg/dL   Total Protein 7.3 6.5 - 8.1 g/dL   Albumin 4.0 3.5 - 5.0 g/dL   AST 26 15 - 41 U/L   ALT 15 14 - 54 U/L   Alkaline Phosphatase 66  38 - 126 U/L   Total Bilirubin 0.7 0.3 - 1.2 mg/dL   GFR calc non Af Amer >60 >60 mL/min   GFR calc Af Amer >60 >60 mL/min    Comment: (NOTE) The eGFR has been calculated using the  CKD EPI equation. This calculation has not been validated in all clinical situations. eGFR's persistently <60 mL/min signify possible Chronic Kidney Disease.    Anion gap 9 5 - 15    Comment: Performed at [facility], [address] [place], [place] [address]  Ethanol     Status: None   Collection Time: 04/05/18  4:29 PM  Result Value Ref Range   Alcohol, Ethyl (B) <10 <10 mg/dL    Comment: (NOTE) Lowest detectable limit for serum alcohol is 10 mg/dL. For medical purposes only. Performed at [facility], [address] [place], [place] [address]   Salicylate level     Status: None   Collection Time: 04/05/18  4:29 PM  Result Value Ref Range   Salicylate Lvl <2.1 2.8 - 30.0 mg/dL    Comment: Performed at [facility], [address] [place], [place] [address]  Acetaminophen level     Status: Abnormal   Collection Time: 04/05/18  4:29 PM  Result Value Ref Range   Acetaminophen (Tylenol), Serum <10 (L) 10 - 30 ug/mL    Comment: (NOTE) Therapeutic concentrations vary significantly. A range of 10-30 ug/mL  may be an effective concentration for many patients. However, some  are best treated at concentrations outside of this range. Acetaminophen concentrations >150 ug/mL at 4 hours after ingestion  and >50 ug/mL at 12 hours after ingestion are often associated with  toxic reactions. Performed at [facility], [address] [place], [place] [address]   cbc     Status: Abnormal   Collection Time: 04/05/18  4:29 PM  Result Value Ref Range   WBC 7.5 3.6 - 11.0 K/uL   RBC 4.01 3.80 - 5.20 MIL/uL   Hemoglobin 11.9 (L) 12.0 - 16.0 g/dL   HCT 35.7 35.0 - 47.0 %   MCV 89.1 80.0 - 100.0 fL   MCH 29.6 26.0 - 34.0 pg   MCHC 33.2 32.0 - 36.0 g/dL   RDW 15.3 (H)  11.5 - 14.5 %   Platelets 310 150 - 440 K/uL    Comment: Performed at [facility], [address], [place], [place] [address]  Urine Drug Screen, Qualitative     Status: None   Collection Time: 04/05/18  4:29 PM  Result Value Ref Range   Tricyclic, Ur Screen NONE DETECTED NONE DETECTED   Amphetamines, Ur Screen NONE DETECTED NONE DETECTED   MDMA (Ecstasy)Ur Screen NONE DETECTED NONE DETECTED   Cocaine Metabolite,Ur [place] NONE DETECTED NONE DETECTED   Opiate, Ur Screen NONE DETECTED NONE DETECTED   Phencyclidine (PCP) Ur S NONE DETECTED NONE DETECTED   Cannabinoid 50 Ng, Ur [place] NONE DETECTED NONE DETECTED   Barbiturates, Ur Screen NONE DETECTED NONE DETECTED   Benzodiazepine, Ur Scrn NONE DETECTED NONE DETECTED   Methadone Scn, Ur NONE DETECTED NONE DETECTED    Comment: (NOTE) Tricyclics + metabolites, urine    Cutoff 1000 ng/mL Amphetamines + metabolites, urine  Cutoff 1000 ng/mL MDMA (Ecstasy), urine              Cutoff 500 ng/mL Cocaine Metabolite, urine          Cutoff 300 ng/mL Opiate + metabolites, urine        Cutoff 300 ng/mL Phencyclidine (PCP), urine         Cutoff 25 ng/mL Cannabinoid, urine                 Cutoff 50 ng/mL Barbiturates + metabolites, urine  Cutoff 200 ng/mL Benzodiazepine, urine              Cutoff 200 ng/mL Methadone, urine                   Cutoff 300 ng/mL The urine drug screen provides only a preliminary, unconfirmed analytical test result and should not be used for non-medical purposes. Clinical consideration and professional judgment should be applied to any positive drug screen result due to possible interfering substances. A more specific alternate chemical method must be used in order to obtain a confirmed analytical result. Gas chromatography / mass spectrometry (GC/MS) is the preferred confirmat ory method. Performed at [facility], [address] [place], [place] [address]     No current facility-administered  medications for this encounter.    Current Outpatient Medications  Medication Sig Dispense Refill  . atorvastatin (LIPITOR) 40 MG tablet Take 40 mg by mouth daily.    . benztropine (COGENTIN) 0.5 MG tablet Take 0.5 mg by mouth 2 (two) times daily.    Marland Kitchen buPROPion (ZYBAN) 150 MG 12 hr tablet Take 150 mg by mouth 2 (two) times daily.    . cholecalciferol (VITAMIN D) 400 units TABS tablet Take 1,000 Units by mouth daily.    . clotrimazole-betamethasone (LOTRISONE) cream Apply 1 application topically 2 (two) times daily.    Marland Kitchen dicyclomine (BENTYL) 20 MG tablet Take 20 mg by mouth every 6 (six) hours.    . DULoxetine (CYMBALTA) 30 MG capsule Take 30 mg by mouth daily.    Marland Kitchen FLUoxetine (PROZAC) 10 MG capsule Take 10 mg by mouth daily.    . fluPHENAZine decanoate (PROLIXIN) 25 MG/ML injection Inject 25 mg into the muscle every 21 ( twenty-one) days.    . fluticasone (FLOVENT HFA) 110 MCG/ACT inhaler Inhale 1 puff into the lungs 2 (two) times daily.    . furosemide (LASIX) 40 MG tablet Take 40 mg by mouth daily.    Marland Kitchen gabapentin (NEURONTIN) 300 MG capsule Take 300 mg by mouth 3 (three) times daily.    . hydrOXYzine (ATARAX/VISTARIL) 25 MG tablet Take 25 mg by mouth 3 (three) times daily as needed.    . lamoTRIgine (LAMICTAL) 25 MG tablet Take 25 mg by mouth 2 (two) times daily.    Marland Kitchen lisinopril (PRINIVIL,ZESTRIL) 5 MG tablet Take 5 mg by mouth daily.    . metFORMIN (GLUCOPHAGE) 500 MG tablet Take by mouth 2 (two) times daily with a meal.    . naproxen (NAPROSYN) 500 MG tablet Take 500 mg by mouth 2 (two) times daily with a meal.    . omeprazole (PRILOSEC) 20 MG capsule Take 20 mg by mouth daily.    . ondansetron (ZOFRAN) 4 MG tablet Take 4 mg by mouth every 8 (eight) hours as needed for nausea or vomiting.    . polyethylene glycol (MIRALAX / GLYCOLAX) packet Take 17 g by mouth daily.    Marland Kitchen spironolactone (ALDACTONE) 50 MG tablet Take 50 mg by mouth daily.    Marland Kitchen tiotropium (SPIRIVA) 18 MCG inhalation  capsule Place 18 mcg into inhaler and inhale daily.    . traZODone (DESYREL) 50 MG tablet Take 50 mg by mouth at bedtime.    . vitamin B-12 (CYANOCOBALAMIN) 1000 MCG tablet Take 1,000 mcg by mouth daily.      Musculoskeletal: Strength & Muscle Tone: within normal limits Gait & Station: normal Patient leans: N/A  Psychiatric Specialty Exam: Physical Exam  Nursing note and vitals reviewed. Constitutional: She  appears well-developed and well-nourished.  HENT:  Head: Normocephalic and atraumatic.  Eyes: Pupils are equal, round, and reactive to light. Conjunctivae are normal.  Neck: Normal range of motion.  Cardiovascular: Regular rhythm and normal heart sounds.  Respiratory: Effort normal. No respiratory distress.  GI: Soft.  Musculoskeletal: Normal range of motion.  Neurological: She is alert.  Skin: Skin is warm and dry.  Psychiatric: Her speech is normal. Her affect is blunt. She is agitated. She is not aggressive. Thought content is not paranoid. Cognition and memory are impaired. She expresses impulsivity. She expresses no homicidal and no suicidal ideation.    Review of Systems  Constitutional: Negative.   HENT: Negative.   Eyes: Negative.   Respiratory: Negative.   Cardiovascular: Negative.   Gastrointestinal: Negative.   Musculoskeletal: Negative.   Skin: Negative.   Neurological: Negative.   Psychiatric/Behavioral: Negative for depression, hallucinations, memory loss, substance abuse and suicidal ideas. The patient is not nervous/anxious and does not have insomnia.     Blood pressure 139/80, pulse (!) 103, temperature 98.6 F (37 C), temperature source Oral, resp. rate 18, height 5' (1.524 m), weight 64.9 kg (143 lb), SpO2 97 %.Body mass index is 27.93 kg/m.  General Appearance: Casual  Eye Contact:  Minimal  Speech:  Clear and Coherent  Volume:  Decreased  Mood:  Anxious  Affect:  Congruent  Thought Process:  Goal Directed  Orientation:  Full (Time, Place, and  Person)  Thought Content:  Rumination and Tangential  Suicidal Thoughts:  No  Homicidal Thoughts:  No  Memory:  Immediate;   Fair Recent;   Fair Remote;   Fair  Judgement:  Fair  Insight:  Fair  Psychomotor Activity:  Decreased  Concentration:  Concentration: Poor  Recall:  [employer] of Knowledge:  Fair  Language:  Fair  Akathisia:  No  Handed:  Right  AIMS (if indicated):     Assets:  Financial Resources/Insurance Housing  ADL's:  Intact  Cognition:  Impaired,  Mild  Sleep:        Treatment Plan Summary: Plan 57 year old woman with schizophrenia.  Currently presents as being completely asymptomatic.  Patient has been calm and cooperative throughout her time in the emergency room.  Not aggressive not threatening.  Denies suicidal thoughts.  States that she just wants to go back to the group home and go to her room and take a nap.  Patient's thoughts are a little bit loose but she did not make any frankly bizarre statements or obviously paranoid statements.  There is no clear indication for any change to medication and certainly not for psychiatric hospitalization or involuntary commitment.  Case reviewed with emergency room physician.  Recommend that the patient can be discharged back to her group home and continue usual outpatient care with her act team.  Disposition: No evidence of imminent risk to self or others at present.   Patient does not meet criteria for psychiatric inpatient admission. Supportive therapy provided about ongoing stressors.  Alethia Berthold, MD 04/05/2018 6:02 PM

## 2018-05-02 ENCOUNTER — Inpatient Hospital Stay (HOSPITAL_COMMUNITY)
Admission: AD | Admit: 2018-05-02 | Discharge: 2018-05-14 | DRG: 885 | Disposition: A | Discharge status: Home / Self Care | Payer: Medicare (Managed Care) | Attending: Psychiatry | Admitting: Psychiatry

## 2018-05-02 ENCOUNTER — Other Ambulatory Visit: Payer: Self-pay

## 2018-05-02 ENCOUNTER — Emergency Department
Admission: EM | Admit: 2018-05-02 | Discharge: 2018-05-02 | Disposition: A | Discharge status: Other Acute Inpatient Hospital | Payer: Medicare (Managed Care) | Attending: Emergency Medicine | Admitting: Emergency Medicine

## 2018-05-02 DIAGNOSIS — F17210 Nicotine dependence, cigarettes, uncomplicated: Secondary | ICD-10-CM | POA: Insufficient documentation

## 2018-05-02 DIAGNOSIS — I509 Heart failure, unspecified: Secondary | ICD-10-CM | POA: Insufficient documentation

## 2018-05-02 DIAGNOSIS — R4182 Altered mental status, unspecified: Secondary | ICD-10-CM | POA: Diagnosis present

## 2018-05-02 DIAGNOSIS — Z79899 Other long term (current) drug therapy: Secondary | ICD-10-CM | POA: Diagnosis not present

## 2018-05-02 DIAGNOSIS — J45909 Unspecified asthma, uncomplicated: Secondary | ICD-10-CM | POA: Insufficient documentation

## 2018-05-02 DIAGNOSIS — R42 Dizziness and giddiness: Secondary | ICD-10-CM | POA: Diagnosis present

## 2018-05-02 DIAGNOSIS — F203 Undifferentiated schizophrenia: Secondary | ICD-10-CM

## 2018-05-02 DIAGNOSIS — F99 Mental disorder, not otherwise specified: Secondary | ICD-10-CM | POA: Diagnosis present

## 2018-05-02 DIAGNOSIS — F329 Major depressive disorder, single episode, unspecified: Secondary | ICD-10-CM | POA: Diagnosis present

## 2018-05-02 DIAGNOSIS — Z915 Personal history of self-harm: Secondary | ICD-10-CM

## 2018-05-02 DIAGNOSIS — I110 Hypertensive heart disease with heart failure: Secondary | ICD-10-CM | POA: Insufficient documentation

## 2018-05-02 DIAGNOSIS — R7303 Prediabetes: Secondary | ICD-10-CM | POA: Diagnosis present

## 2018-05-02 DIAGNOSIS — Z833 Family history of diabetes mellitus: Secondary | ICD-10-CM | POA: Diagnosis not present

## 2018-05-02 DIAGNOSIS — R45851 Suicidal ideations: Secondary | ICD-10-CM | POA: Diagnosis present

## 2018-05-02 DIAGNOSIS — J449 Chronic obstructive pulmonary disease, unspecified: Secondary | ICD-10-CM | POA: Diagnosis present

## 2018-05-02 DIAGNOSIS — K219 Gastro-esophageal reflux disease without esophagitis: Secondary | ICD-10-CM | POA: Diagnosis present

## 2018-05-02 DIAGNOSIS — R296 Repeated falls: Secondary | ICD-10-CM | POA: Diagnosis present

## 2018-05-02 DIAGNOSIS — Z7951 Long term (current) use of inhaled steroids: Secondary | ICD-10-CM

## 2018-05-02 DIAGNOSIS — G8929 Other chronic pain: Secondary | ICD-10-CM | POA: Diagnosis present

## 2018-05-02 DIAGNOSIS — E559 Vitamin D deficiency, unspecified: Secondary | ICD-10-CM | POA: Diagnosis present

## 2018-05-02 DIAGNOSIS — Z6835 Body mass index (BMI) 35.0-35.9, adult: Secondary | ICD-10-CM

## 2018-05-02 DIAGNOSIS — F209 Schizophrenia, unspecified: Secondary | ICD-10-CM | POA: Diagnosis not present

## 2018-05-02 DIAGNOSIS — R5383 Other fatigue: Secondary | ICD-10-CM

## 2018-05-02 DIAGNOSIS — I10 Essential (primary) hypertension: Secondary | ICD-10-CM | POA: Diagnosis not present

## 2018-05-02 DIAGNOSIS — E162 Hypoglycemia, unspecified: Secondary | ICD-10-CM | POA: Diagnosis not present

## 2018-05-02 DIAGNOSIS — F17200 Nicotine dependence, unspecified, uncomplicated: Secondary | ICD-10-CM | POA: Diagnosis present

## 2018-05-02 LAB — SALICYLATE LEVEL: Salicylate Lvl: <7 mg/dL (ref 2.8–30.0)

## 2018-05-02 LAB — URINE DRUG SCREEN, QUALITATIVE (ARMC ONLY)
AMPHETAMINES, UR SCREEN: NOT DETECTED
BENZODIAZEPINE, UR SCRN: NOT DETECTED
Cannabinoid 50 Ng, Ur ~~LOC~~: NOT DETECTED
Cocaine Metabolite,Ur ~~LOC~~: NOT DETECTED
MDMA (ECSTASY) UR SCREEN: NOT DETECTED
METHADONE SCREEN, URINE: NOT DETECTED
Opiate, Ur Screen: NOT DETECTED
Phencyclidine (PCP) Ur S: NOT DETECTED
Tricyclic, Ur Screen: NOT DETECTED

## 2018-05-02 LAB — COMPREHENSIVE METABOLIC PANEL
ALT: 15 U/L (ref 0–44)
AST: 25 U/L (ref 15–41)
Albumin: 4.1 g/dL (ref 3.5–5.0)
Alkaline Phosphatase: 73 U/L (ref 38–126)
Anion gap: 8 (ref 5–15)
BUN: 13 mg/dL (ref 6–20)
CHLORIDE: 104 mmol/L (ref 98–111)
CO2: 24 mmol/L (ref 22–32)
Calcium: 9.5 mg/dL (ref 8.9–10.3)
Creatinine, Ser: 0.77 mg/dL (ref 0.44–1.00)
Glucose, Bld: 104 mg/dL — ABNORMAL HIGH (ref 70–99)
POTASSIUM: 4.3 mmol/L (ref 3.5–5.1)
SODIUM: 136 mmol/L (ref 135–145)
Total Bilirubin: 0.5 mg/dL (ref 0.3–1.2)
Total Protein: 7.2 g/dL (ref 6.5–8.1)

## 2018-05-02 LAB — CBC
HCT: 35.9 % (ref 35.0–47.0)
Hemoglobin: 12 g/dL (ref 12.0–16.0)
MCH: 30.3 pg (ref 26.0–34.0)
MCHC: 33.5 g/dL (ref 32.0–36.0)
MCV: 90.5 fL (ref 80.0–100.0)
PLATELETS: 312 10*3/uL (ref 150–440)
RBC: 3.97 MIL/uL (ref 3.80–5.20)
RDW: 15.2 % — AB (ref 11.5–14.5)
WBC: 6.7 10*3/uL (ref 3.6–11.0)

## 2018-05-02 LAB — ACETAMINOPHEN LEVEL

## 2018-05-02 LAB — ETHANOL: Alcohol, Ethyl (B): <10 mg/dL (ref <10)

## 2018-05-02 MED ORDER — VITAMIN D 1000 UNITS PO TABS
1000.0000 [IU] | ORAL_TABLET | Freq: Every day | ORAL | Status: DC
  Administered 2018-05-02: 1000 [IU] via ORAL
  Filled 2018-05-02: qty 1

## 2018-05-02 MED ORDER — BUPROPION HCL ER (SR) 150 MG PO TB12
150.0000 mg | ORAL_TABLET | Freq: Two times a day (BID) | ORAL | Status: DC
  Administered 2018-05-02: 150 mg via ORAL
  Filled 2018-05-02: qty 1

## 2018-05-02 MED ORDER — FLUPHENAZINE DECANOATE 25 MG/ML IJ SOLN
18.7500 mg | Freq: Once | INTRAMUSCULAR | Status: DC
  Administered 2018-05-02: 20 mg via INTRAMUSCULAR
  Filled 2018-05-02: qty 0.8

## 2018-05-02 MED ORDER — LAMOTRIGINE 25 MG PO TABS
25.0000 mg | ORAL_TABLET | Freq: Two times a day (BID) | ORAL | Status: DC
  Administered 2018-05-02: 25 mg via ORAL
  Filled 2018-05-02 (×2): qty 1

## 2018-05-02 MED ORDER — TIOTROPIUM BROMIDE MONOHYDRATE 18 MCG IN CAPS: 18.0000 ug | ORAL_CAPSULE | Freq: Every day | RESPIRATORY_TRACT | Status: DC

## 2018-05-02 MED ORDER — TRAZODONE HCL 50 MG PO TABS
50.0000 mg | ORAL_TABLET | Freq: Every day | ORAL | Status: DC
  Administered 2018-05-02: 50 mg via ORAL
  Filled 2018-05-02: qty 1

## 2018-05-02 MED ORDER — DULOXETINE HCL 30 MG PO CPEP
30.0000 mg | ORAL_CAPSULE | Freq: Two times a day (BID) | ORAL | Status: DC
  Administered 2018-05-02: 30 mg via ORAL
  Filled 2018-05-02: qty 1

## 2018-05-02 MED ORDER — FLUPHENAZINE HCL 5 MG PO TABS
10.0000 mg | ORAL_TABLET | Freq: Every day | ORAL | Status: DC
  Administered 2018-05-02: 10 mg via ORAL
  Filled 2018-05-02: qty 2

## 2018-05-02 MED ORDER — LISINOPRIL 5 MG PO TABS
5.0000 mg | ORAL_TABLET | Freq: Every day | ORAL | Status: DC
  Administered 2018-05-02: 5 mg via ORAL
  Filled 2018-05-02: qty 1

## 2018-05-02 MED ORDER — HYDROXYZINE HCL 25 MG PO TABS
25.0000 mg | ORAL_TABLET | Freq: Every day | ORAL | Status: DC
  Administered 2018-05-02: 25 mg via ORAL
  Filled 2018-05-02: qty 1

## 2018-05-02 MED ORDER — BENZTROPINE MESYLATE 1 MG PO TABS
0.5000 mg | ORAL_TABLET | Freq: Two times a day (BID) | ORAL | Status: DC
  Administered 2018-05-02: 0.5 mg via ORAL
  Filled 2018-05-02: qty 1

## 2018-05-02 NOTE — ED Notes (Signed)
DR CLAPACS INITIATED IVC PAPERS/MD Jimmye Norman, RN LOUANN, AND ODS SECURITY MADE AWARE. PT PENDING PSYCH CONSULT.

## 2018-05-02 NOTE — ED Notes (Signed)
Pt reports that she was brought to ED due to "fighting because people were stealing from me".  Pt will not state what was bieng stolen from her, just started crying when asked for clarification. Denies SI/HI/AVH on assessment but reports hx of AVH talking of "billy joe".  She states "I think I'm having a nervous breakdown".

## 2018-05-02 NOTE — ED Notes (Addendum)
Stuck X 1 by this RN, unsuccessful.  This RN in room while patient changes into policy behavioral clothing. Purse sent with staff member home.   [place].  Belongings taken home by Helene Kelp.

## 2018-05-02 NOTE — ED Notes (Signed)
Walked back to [employer] by this Therapist, sports. EDP and dorothy, RN aware that patient has order for 1:1 sitter.

## 2018-05-02 NOTE — ED Notes (Signed)
PT IVC/SEEN BY DR CLAPACS/PT PENDING PLACEMENT.

## 2018-05-02 NOTE — ED Notes (Signed)
Report given to [facility] in BMU.

## 2018-05-02 NOTE — BH Assessment (Signed)
Patient is to be admitted to [facility] by Dr. Weber Cooks.  Attending Physician will be Dr. Weber Cooks.   Patient has been assigned to room 320, by [place]   Intake Paper Work has been signed and placed on patient chart.  ER staff is aware of the admission:  Emilie, ER Sectary   Dr. Alfred Levins, ER MD   Earlie Server Patient's Nurse   Neil Crouch, Patient Access.

## 2018-05-02 NOTE — BH Assessment (Signed)
Assessment Note  Gloria Perez is an 57 y.o. female who presents to the ER due to voicing SI. Per the report of the patient, she was brought to the ER due to "passing out in the street and some man helped me..."  Per the patient's [facility] ACT Team [phone]), the last two weeks the patient's behaviors have changed. She's easily agitated and irritable. She believe people are stealing from her and an increase in paranoia. Patient have been in the same [facility] for approximately two years and her current behaviors are new for her. Prior to her recent stay with them, she was in the same [place] for approximately ten years. She moved in with her mother, in [place], for two years. Then moved back into [place] and been there now for the two years.  During the interview, the patient was difficult to engage. She was having thought blocking and was confused. It took her several attempts to give her date of birth. She was able to share the month and day but was having difficult time giving the year. She initially said she was born in 2018. When asked about treatment history and recent behaviors, she would stare at the writer and answers were not appropriate to the question. At times she would start sharing things and when asked about it for clarity, she would tell the writer, "Huh, what you talking about?... I didn't say that..."    Diagnosis: Schizophrenia  Past Medical History:  Past Medical History:  Diagnosis Date  . Abnormal thyroid blood test   . Acute left-sided low back pain without sciatica   . Asthma   . B12 deficiency   . CHF (congestive heart failure) ([place])   . Chronic pain   . Contusion of lower back   . COPD (chronic obstructive pulmonary disease) ([place])   . Falling episodes   . GERD (gastroesophageal reflux disease)   . Hepatitis   . Hypertension   . Nonischemic cardiomyopathy ([place])   . Physical debility   . Pre-diabetes   . Schizophrenia ([place])   . Severe  obesity (BMI 35.0-35.9 with comorbidity) ([place])   . Vitamin D deficiency   . Weight loss     Past Surgical History:  Procedure Laterality Date  . CESAREAN SECTION    . COLONOSCOPY    . COLONOSCOPY WITH PROPOFOL N/A 02/22/2018   Procedure: COLONOSCOPY WITH PROPOFOL;  Surgeon: Toledo, Benay Pike, MD;  Location: [facility] ENDOSCOPY;  Service: Gastroenterology;  Laterality: N/A;  . Right arm surgery    . TUBAL LIGATION      Family History:  Family History  Problem Relation Age of Onset  . Diabetes Mother     Social History:  reports that she has been smoking cigarettes.  She has a 17.50 pack-year smoking history. She has never used smokeless tobacco. She reports that she drank alcohol. She reports that she has current or past drug history.  Additional Social History:  Alcohol / Drug Use Pain Medications: See PTA Prescriptions: See PTA Over the Counter: See PTA History of alcohol / drug use?: No history of alcohol / drug abuse Longest period of sobriety (when/how long): Unknown Negative Consequences of Use: (n/a) Withdrawal Symptoms: (n/a)  CIWA: CIWA-Ar BP: (!) 128/93 Pulse Rate: (!) 107 COWS:    Allergies:  Allergies  Allergen Reactions  . Lisinopril   . Metformin And Related     Home Medications:  (Not in a hospital admission)  OB/GYN Status:  No  LMP recorded. Patient is postmenopausal.  General Assessment Data Location of Assessment: [facility] ED TTS Assessment: In system Is this a Tele or Face-to-Face Assessment?: Face-to-Face Is this an Initial Assessment or a Re-assessment for this encounter?: Initial Assessment Marital status: Single Maiden name: n/a Is patient pregnant?: Unknown Pregnancy Status: No Living Arrangements: Group Home([facility] [phone].)) Can pt return to current living arrangement?: Yes Admission Status: Involuntary Is patient capable of signing voluntary admission?: No(Under IVC) Referral Source: Self/Family/Friend Insurance type:  Medicaid  Medical Screening Exam ([place]) Medical Exam completed: Yes  Crisis Care Plan Living Arrangements: Group Home([facility] [phone].)) Legal Guardian: Other:(Self) Name of Psychiatrist: [place] Name of Therapist: [facility]  Education Status Is patient currently in school?: No Is the patient employed, unemployed or receiving disability?: Unemployed, Receiving disability income  Risk to self with the past 6 months Suicidal Ideation: Yes-Currently Present Has patient been a risk to self within the past 6 months prior to admission? : Yes Suicidal Intent: No Has patient had any suicidal intent within the past 6 months prior to admission? : No Is patient at risk for suicide?: No Suicidal Plan?: No Has patient had any suicidal plan within the past 6 months prior to admission? : No Access to Means: No What has been your use of drugs/alcohol within the last 12 months?: Reports of none Previous Attempts/Gestures: No How many times?: 0 Other Self Harm Risks: Reports of none Triggers for Past Attempts: Unknown Intentional Self Injurious Behavior: None Family Suicide History: Unknown Recent stressful life event(s): Other (Comment), Conflict (Comment), Turmoil (Comment) Persecutory voices/beliefs?: Yes Depression: Yes Depression Symptoms: Isolating, Feeling angry/irritable Substance abuse history and/or treatment for substance abuse?: No Suicide prevention information given to non-admitted patients: Not applicable  Risk to Others within the past 6 months Homicidal Ideation: No Does patient have any lifetime risk of violence toward others beyond the six months prior to admission? : No Thoughts of Harm to Others: No Current Homicidal Intent: No Current Homicidal Plan: No Access to Homicidal Means: No Identified Victim: Reports of none History of harm to others?: No Assessment of Violence: None Noted Violent Behavior Description: Reports of  none Does patient have access to weapons?: No Criminal Charges Pending?: No Does patient have a court date: No Is patient on probation?: No  Psychosis Hallucinations: Auditory, Visual Delusions: Unspecified, Persecutory  Mental Status Report Appearance/Hygiene: Unremarkable, In scrubs Eye Contact: Fair Motor Activity: Freedom of movement, Unremarkable Speech: Soft, Incoherent Level of Consciousness: Drowsy Mood: Anxious, Suspicious, Preoccupied, Sad, Pleasant Affect: Inconsistent with thought content, Anxious Anxiety Level: Minimal Thought Processes: Irrelevant, Flight of Ideas Judgement: Partial Orientation: Person, Place Obsessive Compulsive Thoughts/Behaviors: Moderate  Cognitive Functioning Concentration: Decreased Memory: Recent Impaired, Remote Impaired Is patient IDD: No Is patient DD?: No Insight: Fair Impulse Control: Poor Appetite: Fair Have you had any weight changes? : No Change Sleep: Unable to Assess Total Hours of Sleep: (Unknown) Vegetative Symptoms: None  ADLScreening [facility] Assessment Services) Patient's cognitive ability adequate to safely complete daily activities?: Yes Patient able to express need for assistance with ADLs?: Yes Independently performs ADLs?: Yes (appropriate for developmental age)  Prior Inpatient Therapy Prior Inpatient Therapy: Yes Prior Therapy Dates: Unknown, patient was unable to answer Prior Therapy Facilty/Provider(s): Unknown, patient was unable to remember Reason for Treatment: Unknown, patient was unable to remember  Prior Outpatient Therapy Prior Outpatient Therapy: Yes Prior Therapy Dates: Currently Prior Therapy Facilty/Provider(s): [facility]T Reason for Treatment: Schizophrenia Does patient  have an ACCT team?: Yes Does patient have Intensive In-House Services?  : No Does patient have [facility] services? : No Does patient have P4CC services?: No  ADL Screening (condition at time of admission) Patient's  cognitive ability adequate to safely complete daily activities?: Yes Is the patient deaf or have difficulty hearing?: No Does the patient have difficulty seeing, even when wearing glasses/contacts?: No Does the patient have difficulty concentrating, remembering, or making decisions?: No Patient able to express need for assistance with ADLs?: Yes Does the patient have difficulty dressing or bathing?: No Independently performs ADLs?: Yes (appropriate for developmental age) Does the patient have difficulty walking or climbing stairs?: No Weakness of Legs: None Weakness of Arms/Hands: None  Home Assistive Devices/Equipment Home Assistive Devices/Equipment: None  Therapy Consults (therapy consults require a physician order) PT Evaluation Needed: No OT Evalulation Needed: No SLP Evaluation Needed: No Abuse/Neglect Assessment (Assessment to be complete while patient is alone) Abuse/Neglect Assessment Can Be Completed: Yes Physical Abuse: Denies Verbal Abuse: Denies Sexual Abuse: Denies Exploitation of patient/patient's resources: Denies Self-Neglect: Denies Values / Beliefs Cultural Requests During Hospitalization: None Spiritual Requests During Hospitalization: None Consults Spiritual Care Consult Needed: No Social Work Consult Needed: No         Child/Adolescent Assessment Running Away Risk: Denies(Patient is an adult)  Disposition:  Disposition Initial Assessment Completed for this Encounter: Yes  On Site Evaluation by:   Reviewed with Physician:    Gunnar Fusi MS, LCAS, [place], [place], CCSI Therapeutic Triage Specialist 05/02/2018 3:07 PM

## 2018-05-02 NOTE — Consult Note (Signed)
Gloria Perez Face-to-Face Psychiatry Consult   Reason for Consult: Consult for 57 year old woman with a history of schizophrenia or schizoaffective disorder who came to the hospital with suicidal ideation Referring Physician: Rip Harbour Patient Identification: Gloria Perez MRN:  [id number] Principal Diagnosis: <principal problem not specified> Diagnosis:   Patient Active Problem List   Diagnosis Date Noted  . Schizophrenia ([place]) [F20.9] 04/05/2018    Total Time spent with patient: 1 hour  Subjective:   Gloria Perez is a 57 y.o. female patient admitted with "I am suicidal".  HPI: Patient seen.  Chart reviewed.  57 year old woman with a known history of schizophrenia.  Brought here from her group home with reports that she has been acting strangely for the last 2 weeks.  Patient was very withdrawn and engaged only partially with the history.  She told me that she was suicidal and wanted to kill herself by running out into traffic.  She could not specify how long this is been going on.  Also said she was hearing voices.  Denies any recent drug use.  Claims to be compliant with all of her normal medicine.  Did not report any particular stressor.  Patient's group home gave information that for the past 2 weeks her behavior had been abnormal for her.  They did not know of any reason for this.  Patient was seen here in the emergency room a few weeks ago and at that time was reported to have been angry at her group home but was denying symptoms when she got to the hospital.  Social history: Lives in a group home.  Has a legal guardian.  Has been in the group home for a few years.  Medical history: High blood pressure.  COPD.  Substance abuse history: No reported past substance abuse history no evidence of current substance use.  Past Psychiatric History: Patient has a long history of schizophrenia and had several hospitalizations in the past.  Is currently followed by an act team.  I believe she is still  getting Prolixin Decanoate.  Unknown when she had her last injection.  Patient does have a past history of aggression and suicidal behavior.  Past episodes of decompensation off and the result of some kind of heart break.  Risk to Self: Suicidal Ideation: Yes-Currently Present Suicidal Intent: No Is patient at risk for suicide?: No Suicidal Plan?: No Access to Means: No What has been your use of drugs/alcohol within the last 12 months?: Reports of none Risk to Others:   Prior Inpatient Therapy:   Prior Outpatient Therapy:    Past Medical History:  Past Medical History:  Diagnosis Date  . Abnormal thyroid blood test   . Acute left-sided low back pain without sciatica   . Asthma   . B12 deficiency   . CHF (congestive heart failure) ([place])   . Chronic pain   . Contusion of lower back   . COPD (chronic obstructive pulmonary disease) ([place])   . Falling episodes   . GERD (gastroesophageal reflux disease)   . Hepatitis   . Hypertension   . Nonischemic cardiomyopathy ([place])   . Physical debility   . Pre-diabetes   . Schizophrenia ([place])   . Severe obesity (BMI 35.0-35.9 with comorbidity) ([place])   . Vitamin D deficiency   . Weight loss     Past Surgical History:  Procedure Laterality Date  . CESAREAN SECTION    . COLONOSCOPY    . COLONOSCOPY WITH PROPOFOL N/A 02/22/2018   Procedure: COLONOSCOPY WITH PROPOFOL;  Surgeon: Toledo, Benay Pike, MD;  Location: [facility] ENDOSCOPY;  Service: Gastroenterology;  Laterality: N/A;  . Right arm surgery    . TUBAL LIGATION     Family History:  Family History  Problem Relation Age of Onset  . Diabetes Mother    Family Psychiatric  History: None identified Social History:  Social History   Substance and Sexual Activity  Alcohol Use Not Currently     Social History   Substance and Sexual Activity  Drug Use Not Currently    Social History   Socioeconomic History  . Marital status: Widowed    Spouse name: Not on file  . Number of children:  Not on file  . Years of education: Not on file  . Highest education level: Not on file  Occupational History  . Not on file  Social Needs  . Financial resource strain: Not on file  . Food insecurity:    Worry: Not on file    Inability: Not on file  . Transportation needs:    Medical: Not on file    Non-medical: Not on file  Tobacco Use  . Smoking status: Current Every Day Smoker    Packs/day: 0.50    Years: 35.00    Pack years: 17.50    Types: Cigarettes  . Smokeless tobacco: Never Used  Substance and Sexual Activity  . Alcohol use: Not Currently  . Drug use: Not Currently  . Sexual activity: Not on file  Lifestyle  . Physical activity:    Days per week: Not on file    Minutes per session: Not on file  . Stress: Not on file  Relationships  . Social connections:    Talks on phone: Not on file    Gets together: Not on file    Attends religious service: Not on file    Active member of club or organization: Not on file    Attends meetings of clubs or organizations: Not on file    Relationship status: Not on file  Other Topics Concern  . Not on file  Social History Narrative  . Not on file   Additional Social History:    Allergies:   Allergies  Allergen Reactions  . Lisinopril   . Metformin And Related     Labs:  Results for orders placed or performed during the hospital encounter of 05/02/18 (from the past 48 hour(s))  Comprehensive metabolic panel     Status: Abnormal   Collection Time: 05/02/18 11:30 AM  Result Value Ref Range   Sodium 136 135 - 145 mmol/L   Potassium 4.3 3.5 - 5.1 mmol/L   Chloride 104 98 - 111 mmol/L    Comment: Please note change in reference range.   CO2 24 22 - 32 mmol/L   Glucose, Bld 104 (H) 70 - 99 mg/dL    Comment: Please note change in reference range.   BUN 13 6 - 20 mg/dL    Comment: Please note change in reference range.   Creatinine, Ser 0.77 0.44 - 1.00 mg/dL   Calcium 9.5 8.9 - 10.3 mg/dL   Total Protein 7.2 6.5 - 8.1  g/dL   Albumin 4.1 3.5 - 5.0 g/dL   AST 25 15 - 41 U/L   ALT 15 0 - 44 U/L    Comment: Please note change in reference range.   Alkaline Phosphatase 73 38 - 126 U/L   Total Bilirubin 0.5 0.3 - 1.2 mg/dL   GFR calc non Af Amer >60 >60  mL/min   GFR calc Af Amer >60 >60 mL/min    Comment: (NOTE) The eGFR has been calculated using the CKD EPI equation. This calculation has not been validated in all clinical situations. eGFR's persistently <60 mL/min signify possible Chronic Kidney Disease.    Anion gap 8 5 - 15    Comment: Performed at [facility], [address] [place], [place] [address]  Ethanol     Status: None   Collection Time: 05/02/18 11:30 AM  Result Value Ref Range   Alcohol, Ethyl (B) <10 <10 mg/dL    Comment: (NOTE) Lowest detectable limit for serum alcohol is 10 mg/dL. For medical purposes only. Performed at [facility], [address] [place], [place] [address]   Salicylate level     Status: None   Collection Time: 05/02/18 11:30 AM  Result Value Ref Range   Salicylate Lvl <3.8 2.8 - 30.0 mg/dL    Comment: Performed at [facility], [address] [place], [place] [address]  Acetaminophen level     Status: Abnormal   Collection Time: 05/02/18 11:30 AM  Result Value Ref Range   Acetaminophen (Tylenol), Serum <10 (L) 10 - 30 ug/mL    Comment: (NOTE) Therapeutic concentrations vary significantly. A range of 10-30 ug/mL  may be an effective concentration for many patients. However, some  are best treated at concentrations outside of this range. Acetaminophen concentrations >150 ug/mL at 4 hours after ingestion  and >50 ug/mL at 12 hours after ingestion are often associated with  toxic reactions. Performed at [facility], [address] [place], [place] [address]   cbc     Status: Abnormal   Collection Time: 05/02/18 11:30 AM  Result Value Ref Range   WBC 6.7 3.6 - 11.0 K/uL   RBC 3.97 3.80 - 5.20 MIL/uL    Hemoglobin 12.0 12.0 - 16.0 g/dL   HCT 35.9 35.0 - 47.0 %   MCV 90.5 80.0 - 100.0 fL   MCH 30.3 26.0 - 34.0 pg   MCHC 33.5 32.0 - 36.0 g/dL   RDW 15.2 (H) 11.5 - 14.5 %   Platelets 312 150 - 440 K/uL    Comment: Performed at [facility], [address] [place], [place] [address]  Urine Drug Screen, Qualitative     Status: Abnormal   Collection Time: 05/02/18 11:30 AM  Result Value Ref Range   Tricyclic, Ur Screen NONE DETECTED NONE DETECTED   Amphetamines, Ur Screen NONE DETECTED NONE DETECTED   MDMA (Ecstasy)Ur Screen NONE DETECTED NONE DETECTED   Cocaine Metabolite,Ur [place] NONE DETECTED NONE DETECTED   Opiate, Ur Screen NONE DETECTED NONE DETECTED   Phencyclidine (PCP) Ur S NONE DETECTED NONE DETECTED   Cannabinoid 50 Ng, Ur [place] NONE DETECTED NONE DETECTED   Barbiturates, Ur Screen (A) NONE DETECTED    Result not available. Reagent lot number recalled by manufacturer.   Benzodiazepine, Ur Scrn NONE DETECTED NONE DETECTED   Methadone Scn, Ur NONE DETECTED NONE DETECTED    Comment: (NOTE) Tricyclics + metabolites, urine    Cutoff 1000 ng/mL Amphetamines + metabolites, urine  Cutoff 1000 ng/mL MDMA (Ecstasy), urine              Cutoff 500 ng/mL Cocaine Metabolite, urine          Cutoff 300 ng/mL Opiate + metabolites, urine        Cutoff 300 ng/mL Phencyclidine (PCP), urine         Cutoff 25  ng/mL Cannabinoid, urine                 Cutoff 50 ng/mL Barbiturates + metabolites, urine  Cutoff 200 ng/mL Benzodiazepine, urine              Cutoff 200 ng/mL Methadone, urine                   Cutoff 300 ng/mL The urine drug screen provides only a preliminary, unconfirmed analytical test result and should not be used for non-medical purposes. Clinical consideration and professional judgment should be applied to any positive drug screen result due to possible interfering substances. A more specific alternate chemical method must be used in order to obtain a confirmed analytical  result. Gas chromatography / mass spectrometry (GC/MS) is the preferred confirmat ory method. Performed at [facility], [address] [place], [place] [address]     Current Facility-Administered Medications  Medication Dose Route Frequency Provider Last Rate Last Dose  . benztropine (COGENTIN) tablet 0.5 mg  0.5 mg Oral BID Dhairya Corales T, MD      . buPROPion [facility] SR) 12 hr tablet 150 mg  150 mg Oral BID Aryam Zhan T, MD      . cholecalciferol (VITAMIN D) tablet 1,000 Units  1,000 Units Oral Daily Frimet Durfee T, MD      . DULoxetine (CYMBALTA) DR capsule 30 mg  30 mg Oral BID Rushie Brazel T, MD      . fluPHENAZine (PROLIXIN) tablet 10 mg  10 mg Oral QHS Geron Mulford T, MD      . hydrOXYzine (ATARAX/VISTARIL) tablet 25 mg  25 mg Oral QHS Zuly Belkin T, MD      . lamoTRIgine (LAMICTAL) tablet 25 mg  25 mg Oral BID Diella Gillingham T, MD      . lisinopril (PRINIVIL,ZESTRIL) tablet 5 mg  5 mg Oral Daily Juliet Vasbinder T, MD      . tiotropium (SPIRIVA) inhalation capsule 18 mcg  18 mcg Inhalation Daily Tamiyah Moulin T, MD      . traZODone (DESYREL) tablet 50 mg  50 mg Oral QHS Keatyn Luck, Madie Reno, MD       Current Outpatient Medications  Medication Sig Dispense Refill  . atorvastatin (LIPITOR) 40 MG tablet Take 40 mg by mouth daily.    . benztropine (COGENTIN) 0.5 MG tablet Take 0.5 mg by mouth 2 (two) times daily.    Mar[place] Kitchen buPROPion (ZYBAN) 150 MG 12 hr tablet Take 150 mg by mouth 2 (two) times daily.    . cholecalciferol (VITAMIN D) 400 units TABS tablet Take 1,000 Units by mouth daily.    . clotrimazole-betamethasone (LOTRISONE) cream Apply 1 application topically 2 (two) times daily.    Mar[place] Kitchen dicyclomine (BENTYL) 20 MG tablet Take 20 mg by mouth every 6 (six) hours.    . DULoxetine (CYMBALTA) 30 MG capsule Take 30 mg by mouth daily.    Mar[place] Kitchen FLUoxetine (PROZAC) 10 MG capsule Take 10 mg by mouth daily.    . fluPHENAZine decanoate (PROLIXIN) 25 MG/ML injection Inject 25 mg into  the muscle every 21 ( twenty-one) days.    . fluticasone (FLOVENT HFA) 110 MCG/ACT inhaler Inhale 1 puff into the lungs 2 (two) times daily.    . furosemide (LASIX) 40 MG tablet Take 40 mg by mouth daily.    Mar[place] Kitchen gabapentin (NEURONTIN) 300 MG capsule Take 300 mg by mouth 3 (three) times daily.    . hydrOXYzine (ATARAX/VISTARIL) 25 MG tablet  Take 25 mg by mouth 3 (three) times daily as needed.    . lamoTRIgine (LAMICTAL) 25 MG tablet Take 25 mg by mouth 2 (two) times daily.    Mar[place] Kitchen lisinopril (PRINIVIL,ZESTRIL) 5 MG tablet Take 5 mg by mouth daily.    . metFORMIN (GLUCOPHAGE) 500 MG tablet Take by mouth 2 (two) times daily with a meal.    . naproxen (NAPROSYN) 500 MG tablet Take 500 mg by mouth 2 (two) times daily with a meal.    . omeprazole (PRILOSEC) 20 MG capsule Take 20 mg by mouth daily.    . ondansetron (ZOFRAN) 4 MG tablet Take 4 mg by mouth every 8 (eight) hours as needed for nausea or vomiting.    . polyethylene glycol (MIRALAX / GLYCOLAX) packet Take 17 g by mouth daily.    Mar[place] Kitchen spironolactone (ALDACTONE) 50 MG tablet Take 50 mg by mouth daily.    Mar[place] Kitchen tiotropium (SPIRIVA) 18 MCG inhalation capsule Place 18 mcg into inhaler and inhale daily.    . traZODone (DESYREL) 50 MG tablet Take 50 mg by mouth at bedtime.    . vitamin B-12 (CYANOCOBALAMIN) 1000 MCG tablet Take 1,000 mcg by mouth daily.      Musculoskeletal: Strength & Muscle Tone: within normal limits Gait & Station: normal Patient leans: N/A  Psychiatric Specialty Exam: Physical Exam  Nursing note and vitals reviewed. Constitutional: She appears well-developed.  HENT:  Head: Normocephalic and atraumatic.  Eyes: Pupils are equal, round, and reactive to light. Conjunctivae are normal.  Neck: Normal range of motion.  Cardiovascular: Regular rhythm and normal heart sounds.  Respiratory: Effort normal. No respiratory distress.  GI: Soft.  Musculoskeletal: Normal range of motion.  Neurological: She is alert.  Skin: Skin is warm and  dry.  Psychiatric: Her affect is blunt. Her speech is delayed. She is withdrawn. Cognition and memory are impaired. She expresses inappropriate judgment. She exhibits a depressed mood. She expresses suicidal ideation. She expresses suicidal plans. She is noncommunicative.    Review of Systems  Constitutional: Negative.   HENT: Negative.   Eyes: Negative.   Respiratory: Negative.   Cardiovascular: Negative.   Gastrointestinal: Negative.   Musculoskeletal: Negative.   Skin: Negative.   Neurological: Negative.   Psychiatric/Behavioral: Positive for depression, hallucinations and suicidal ideas. Negative for memory loss and substance abuse. The patient is nervous/anxious and has insomnia.     Blood pressure (!) 128/93, pulse (!) 107, temperature 98.2 F (36.8 C), temperature source Oral, resp. rate 16, height 5' (1.524 m), weight 65.8 kg (145 lb), SpO2 100 %.Body mass index is 28.32 kg/m.  General Appearance: Casual  Eye Contact:  None  Speech:  Slow  Volume:  Decreased  Mood:  Depressed  Affect:  Congruent, Depressed and Tearful  Thought Process:  Disorganized  Orientation:  Full (Time, Place, and Person)  Thought Content:  Hallucinations: Auditory  Suicidal Thoughts:  Yes.  with intent/plan  Homicidal Thoughts:  No  Memory:  Immediate;   Fair Recent;   Poor Remote;   Fair  Judgement:  Impaired  Insight:  Shallow  Psychomotor Activity:  Decreased  Concentration:  Concentration: Fair  Recall:  [employer] of Knowledge:  Fair  Language:  Fair  Akathisia:  No  Handed:  Right  AIMS (if indicated):     Assets:  Desire for Improvement Resilience Social Support  ADL's:  Impaired  Cognition:  Impaired,  Mild  Sleep:        Treatment Plan Summary: Daily contact  with patient to assess and evaluate symptoms and progress in treatment, Medication management and Plan Patient with schizophrenia appears to have decompensated.  Reporting hallucinations and suicidal thoughts.  Running  away.  Agitated behavior.  Currently tearful and disorganized in her thinking.  Unclear stressor.  No clear medical problem.  Patient will be admitted to the psychiatric ward.  Commitment papers initiated.  Orders continued for usual outpatient medicine with the addition of oral Prolixin.  Case reviewed with emergency room doctor and TTS.  Admission orders done.  Full set of labs will be done.  Disposition: Recommend psychiatric Inpatient admission when medically cleared. Supportive therapy provided about ongoing stressors.  Alethia Berthold, MD 05/02/2018 2:57 PM

## 2018-05-02 NOTE — ED Notes (Signed)
IVC/ Consult completed/ Plan to admit when medically clear

## 2018-05-02 NOTE — ED Notes (Signed)
Spoke with TTS. TTS states possibility of patient being admitted to BMU after 11pm.

## 2018-05-02 NOTE — ED Notes (Signed)
Called BMU to see what time patient could be admitted. This Probation officer spoke to [employer]. RN states because of staffing and acuity,  patient might not be able to be admitted until AM.

## 2018-05-02 NOTE — ED Triage Notes (Signed)
Pt to ER via POV from [place] group home with Maia Breslow. Pt wants to hurt herself, no plan or attempt. Also auditory hallucinations for the last few weeks.

## 2018-05-02 NOTE — ED Provider Notes (Signed)
[facility] Emergency Department Provider Note       Time seen: ----------------------------------------- 11:52 AM on 05/02/2018 -----------------------------------------   I have reviewed the triage vital signs and the nursing notes.  HISTORY   Chief Complaint Psychiatric Evaluation and Hallucinations    HPI Gloria Perez is a 57 y.o. female with a history of CHF, COPD, GERD, hypertension, cardiomyopathy who presents to the ED for "fighting because people were stealing from her".  Patient presents here crying and upset but does not want to talk about it upon my interaction with her.  She denies suicidal homicidal ideation.  Patient states "I think him having a nervous breakdown".  She denies other complaints.  Past Medical History:  Diagnosis Date  . Abnormal thyroid blood test   . Acute left-sided low back pain without sciatica   . Asthma   . B12 deficiency   . CHF (congestive heart failure) ([place])   . Chronic pain   . Contusion of lower back   . COPD (chronic obstructive pulmonary disease) ([place])   . Falling episodes   . GERD (gastroesophageal reflux disease)   . Hepatitis   . Hypertension   . Nonischemic cardiomyopathy ([place])   . Physical debility   . Pre-diabetes   . Schizophrenia ([place])   . Severe obesity (BMI 35.0-35.9 with comorbidity) ([place])   . Vitamin D deficiency   . Weight loss     Patient Active Problem List   Diagnosis Date Noted  . Schizophrenia ([place]) 04/05/2018    Past Surgical History:  Procedure Laterality Date  . CESAREAN SECTION    . COLONOSCOPY    . COLONOSCOPY WITH PROPOFOL N/A 02/22/2018   Procedure: COLONOSCOPY WITH PROPOFOL;  Surgeon: Toledo, Benay Pike, MD;  Location: [facility] ENDOSCOPY;  Service: Gastroenterology;  Laterality: N/A;  . Right arm surgery    . TUBAL LIGATION      Allergies Lisinopril and Metformin and related  Social History Social History   Tobacco Use  . Smoking status: Current Every Day Smoker     Packs/day: 0.50    Years: 35.00    Pack years: 17.50    Types: Cigarettes  . Smokeless tobacco: Never Used  Substance Use Topics  . Alcohol use: Not Currently  . Drug use: Not Currently   Review of Systems Constitutional: Negative for fever. Cardiovascular: Negative for chest pain. Respiratory: Negative for shortness of breath. Gastrointestinal: Negative for abdominal pain, positive for nausea and vomiting Musculoskeletal: Negative for back pain. Skin: Negative for rash. Neurological: Negative for headaches, focal weakness or numbness.  All systems negative/normal/unremarkable except as stated in the HPI  ____________________________________________   PHYSICAL EXAM:  VITAL SIGNS: ED Triage Vitals [05/02/18 1127]  Enc Vitals Group     BP (!) 128/93     Pulse Rate (!) 107     Resp 16     Temp 98.2 F (36.8 C)     Temp Source Oral     SpO2 100 %     Weight 145 lb (65.8 kg)     Height 5' (1.524 m)     Head Circumference      Peak Flow      Pain Score 0     Pain Loc      Pain Edu?      Excl. in [place]?    Constitutional: Alert and oriented.  No distress Eyes: Conjunctivae are normal. Normal extraocular movements. ENT   Head: Normocephalic and atraumatic.   Nose: No congestion/rhinnorhea.  Mouth/Throat: Mucous membranes are moist.   Neck: No stridor. Cardiovascular: Normal rate, regular rhythm. No murmurs, rubs, or gallops. Respiratory: Normal respiratory effort without tachypnea nor retractions. Breath sounds are clear and equal bilaterally. No wheezes/rales/rhonchi. Gastrointestinal: Soft and nontender. Normal bowel sounds Musculoskeletal: Nontender with normal range of motion in extremities. No lower extremity tenderness nor edema. Neurologic:  Normal speech and language. No gross focal neurologic deficits are appreciated.  Skin:  Skin is warm, dry and intact. No rash noted. Psychiatric: Depressed mood and  affect ____________________________________________  ED COURSE:  As part of my medical decision making, I reviewed the following data within the [place] History obtained from family if available, nursing notes, old chart and ekg, as well as notes from prior ED visits. Patient presented for possible nervous breakdown, we will assess with labs and consult psychiatry.   Procedures ____________________________________________   LABS (pertinent positives/negatives)  Labs Reviewed  COMPREHENSIVE METABOLIC PANEL - Abnormal; Notable for the following components:      Result Value   Glucose, Bld 104 (*)    All other components within normal limits  ACETAMINOPHEN LEVEL - Abnormal; Notable for the following components:   Acetaminophen (Tylenol), Serum <10 (*)    All other components within normal limits  CBC - Abnormal; Notable for the following components:   RDW 15.2 (*)    All other components within normal limits  ETHANOL  SALICYLATE LEVEL  URINE DRUG SCREEN, QUALITATIVE ([facility] ONLY)   ____________________________________________  DIFFERENTIAL DIAGNOSIS    Schizophrenia, psychosis, depression, medication noncompliance  FINAL ASSESSMENT AND PLAN  Schizophrenia   Plan: The patient had presented for agitation hospice secondary to schizophrenia. Patient's labs are unremarkable.  She appears medically stable for psychiatric disposition.   Laurence Aly, MD   Note: This note was generated in part or whole with voice recognition software. Voice recognition is usually quite accurate but there are transcription errors that can and very often do occur. I apologize for any typographical errors that were not detected and corrected.     Earleen Newport, MD 05/02/18 1251

## 2018-05-03 ENCOUNTER — Inpatient Hospital Stay: Payer: Medicare (Managed Care) | Attending: Psychiatry

## 2018-05-03 ENCOUNTER — Other Ambulatory Visit: Payer: Self-pay

## 2018-05-03 ENCOUNTER — Inpatient Hospital Stay: Payer: Medicare (Managed Care)

## 2018-05-03 DIAGNOSIS — F203 Undifferentiated schizophrenia: Principal | ICD-10-CM

## 2018-05-03 LAB — HEMOGLOBIN A1C
HEMOGLOBIN A1C: 4.7 % — AB (ref 4.8–5.6)
MEAN PLASMA GLUCOSE: 88.19 mg/dL

## 2018-05-03 LAB — GLUCOSE, CAPILLARY: Glucose-Capillary: 109 mg/dL — ABNORMAL HIGH (ref 70–99)

## 2018-05-03 MED ORDER — LAMOTRIGINE 25 MG PO TABS: 25.0000 mg | ORAL_TABLET | Freq: Two times a day (BID) | ORAL | Status: DC

## 2018-05-03 MED ORDER — BENZTROPINE MESYLATE 0.5 MG PO TABS
0.5000 mg | ORAL_TABLET | Freq: Two times a day (BID) | ORAL | Status: DC
Start: 2018-05-03 — End: 2018-05-09
  Administered 2018-05-03 – 2018-05-08 (×11): 0.5 mg via ORAL
  Filled 2018-05-03 (×12): qty 1

## 2018-05-03 MED ORDER — ALUM & MAG HYDROXIDE-SIMETH 200-200-20 MG/5ML PO SUSP: 30.0000 mL | ORAL | Status: DC | PRN

## 2018-05-03 MED ORDER — LISINOPRIL 5 MG PO TABS
5.0000 mg | ORAL_TABLET | Freq: Every day | ORAL | Status: DC
  Filled 2018-05-03: qty 1

## 2018-05-03 MED ORDER — DULOXETINE HCL 30 MG PO CPEP
30.0000 mg | ORAL_CAPSULE | Freq: Two times a day (BID) | ORAL | Status: DC
  Administered 2018-05-03 – 2018-05-07 (×9): 30 mg via ORAL
  Filled 2018-05-03 (×9): qty 1

## 2018-05-03 MED ORDER — FLUPHENAZINE HCL 5 MG PO TABS
10.0000 mg | ORAL_TABLET | Freq: Every day | ORAL | Status: DC
  Administered 2018-05-03 – 2018-05-08 (×6): 10 mg via ORAL
  Filled 2018-05-03 (×6): qty 2

## 2018-05-03 MED ORDER — MAGNESIUM HYDROXIDE 400 MG/5ML PO SUSP: 30.0000 mL | Freq: Every day | ORAL | Status: DC | PRN

## 2018-05-03 MED ORDER — TIOTROPIUM BROMIDE MONOHYDRATE 18 MCG IN CAPS
18.0000 ug | ORAL_CAPSULE | Freq: Every day | RESPIRATORY_TRACT | Status: DC
  Administered 2018-05-03 – 2018-05-13 (×10): 18 ug via RESPIRATORY_TRACT
  Filled 2018-05-03 (×3): qty 5

## 2018-05-03 MED ORDER — TRAZODONE HCL 50 MG PO TABS
50.0000 mg | ORAL_TABLET | Freq: Every day | ORAL | Status: DC
  Administered 2018-05-03 – 2018-05-05 (×3): 50 mg via ORAL
  Filled 2018-05-03 (×3): qty 1

## 2018-05-03 MED ORDER — BUPROPION HCL ER (SR) 150 MG PO TB12
150.0000 mg | ORAL_TABLET | Freq: Two times a day (BID) | ORAL | Status: DC
  Administered 2018-05-03 – 2018-05-08 (×10): 150 mg via ORAL
  Filled 2018-05-03 (×11): qty 1

## 2018-05-03 MED ORDER — HYDROXYZINE HCL 25 MG PO TABS
25.0000 mg | ORAL_TABLET | Freq: Three times a day (TID) | ORAL | Status: DC | PRN
  Filled 2018-05-03: qty 1

## 2018-05-03 MED ORDER — HYDROXYZINE HCL 25 MG PO TABS
25.0000 mg | ORAL_TABLET | Freq: Every day | ORAL | Status: DC
  Administered 2018-05-03 – 2018-05-13 (×11): 25 mg via ORAL
  Filled 2018-05-03 (×12): qty 1

## 2018-05-03 MED ORDER — ACETAMINOPHEN 325 MG PO TABS
650.0000 mg | ORAL_TABLET | Freq: Four times a day (QID) | ORAL | Status: DC | PRN
  Administered 2018-05-03 – 2018-05-11 (×3): 650 mg via ORAL
  Filled 2018-05-03 (×3): qty 2

## 2018-05-03 MED ORDER — TRAZODONE HCL 100 MG PO TABS
100.0000 mg | ORAL_TABLET | Freq: Every evening | ORAL | Status: DC | PRN
  Administered 2018-05-07 – 2018-05-13 (×6): 100 mg via ORAL
  Filled 2018-05-03 (×7): qty 1

## 2018-05-03 MED ORDER — VITAMIN D 1000 UNITS PO TABS
1000.0000 [IU] | ORAL_TABLET | Freq: Every day | ORAL | Status: DC
  Administered 2018-05-03 – 2018-05-14 (×12): 1000 [IU] via ORAL
  Filled 2018-05-03 (×12): qty 1

## 2018-05-03 MED ORDER — INSULIN ASPART 100 UNIT/ML ~~LOC~~ SOLN
0.0000 [IU] | Freq: Three times a day (TID) | SUBCUTANEOUS | Status: DC
  Administered 2018-05-05: 2 [IU] via SUBCUTANEOUS
  Filled 2018-05-03 (×2): qty 1

## 2018-05-03 NOTE — BHH Suicide Risk Assessment (Signed)
[facility] Admission Suicide Risk Assessment   Nursing information obtained from:  Patient Demographic factors:  Unemployed, Low socioeconomic status Current Mental Status:  Suicidal ideation indicated by patient Loss Factors:  Decline in physical health, Financial problems / change in socioeconomic status Historical Factors:  Prior suicide attempts Risk Reduction Factors:  NA  Total Time spent with patient: 1 hour Principal Problem: <principal problem not specified> Diagnosis:   Patient Active Problem List   Diagnosis Date Noted  . Schizophrenia ([place]) [F20.9] 04/05/2018   Subjective Data: 57 year old woman came in through the emergency room yesterday.  Patient was reporting suicidal ideation and psychotic symptoms.  On interview today the patient is withdrawn and not very communicative.  Continues to say she is having some hallucinations.  Chief complaint today however is being dizzy and weak.  Does not have any intention of acting on hurting herself.  Continued Clinical Symptoms:    The "Alcohol Use Disorders Identification Test", Guidelines for Use in Primary Care, Second Edition.  World Pharmacologist [facility]). Score between 0-7:  no or low risk or alcohol related problems. Score between 8-15:  moderate risk of alcohol related problems. Score between 16-19:  high risk of alcohol related problems. Score 20 or above:  warrants further diagnostic evaluation for alcohol dependence and treatment.   CLINICAL FACTORS:   Schizophrenia:   Depressive state   Musculoskeletal: Strength & Muscle Tone: decreased Gait & Station: unsteady Patient leans: N/A  Psychiatric Specialty Exam: Physical Exam  Nursing note and vitals reviewed. Constitutional: She appears well-developed.  HENT:  Head: Normocephalic and atraumatic.  Eyes: Pupils are equal, round, and reactive to light. Conjunctivae are normal.  Neck: Normal range of motion.  Cardiovascular: Normal heart sounds.  Respiratory: Effort  normal. No respiratory distress.  GI: Soft.  Musculoskeletal: Normal range of motion.  Neurological: She is alert.  Skin: Skin is warm and dry.  Psychiatric: Her affect is blunt. Her speech is delayed. She is slowed. She is not agitated. Thought content is not paranoid. Cognition and memory are impaired. She expresses inappropriate judgment. She expresses no homicidal and no suicidal ideation. She is noncommunicative.    Review of Systems  Constitutional: Positive for malaise/fatigue and weight loss.  HENT: Negative.   Eyes: Negative.   Respiratory: Negative.   Cardiovascular: Negative.   Gastrointestinal: Negative.   Musculoskeletal: Negative.   Skin: Negative.   Neurological: Positive for dizziness.  Psychiatric/Behavioral: Positive for depression and memory loss. Negative for hallucinations, substance abuse and suicidal ideas. The patient is nervous/anxious and has insomnia.     Blood pressure 99/68, pulse (!) 102, temperature 98.2 F (36.8 C), temperature source Oral, resp. rate 18, height 5' (1.524 m), weight 66.7 kg (147 lb), SpO2 100 %.Body mass index is 28.71 kg/m.  General Appearance: Casual  Eye Contact:  Minimal  Speech:  Slow and Slurred  Volume:  Decreased  Mood:  Depressed and Dysphoric  Affect:  Flat  Thought Process:  Disorganized  Orientation:  Negative  Thought Content:  Illogical  Suicidal Thoughts:  No  Homicidal Thoughts:  No  Memory:  Immediate;   Fair Recent;   Poor Remote;   Poor  Judgement:  Impaired  Insight:  Shallow  Psychomotor Activity:  Decreased  Concentration:  Concentration: Poor  Recall:  Poor  Fund of Knowledge:  Fair  Language:  Fair  Akathisia:  No  Handed:  Right  AIMS (if indicated):     Assets:  Desire for Improvement Housing Social Support  ADL's:  Impaired  Cognition:  Impaired,  Mild  Sleep:         COGNITIVE FEATURES THAT CONTRIBUTE TO RISK:  Loss of executive function    SUICIDE RISK:   Mild:  Suicidal ideation  of limited frequency, intensity, duration, and specificity.  There are no identifiable plans, no associated intent, mild dysphoria and related symptoms, good self-control (both objective and subjective assessment), few other risk factors, and identifiable protective factors, including available and accessible social support.  PLAN OF CARE: Patient is being watched regularly on 15-minute checks.  Close monitoring of vitals.  Further medical workup.  Continue usual outpatient antipsychotic medicines while working on finding why she is more physically sick.  Full reevaluation including suicidal ideation before discharge  I certify that inpatient services furnished can reasonably be expected to improve the patient's condition.   Alethia Berthold, MD 05/03/2018, 3:58 PM

## 2018-05-03 NOTE — Tx Team (Signed)
Initial Treatment Plan 05/03/2018 1:52 AM Collie Siad ZVJ:[id number]    PATIENT STRESSORS: Financial difficulties Health problems   PATIENT STRENGTHS: Average or above average intelligence Supportive family/friends   PATIENT IDENTIFIED PROBLEMS: SI  "Im tired of being here"  Depression "I dont like where Im at"                   DISCHARGE CRITERIA:  Ability to meet basic life and health needs Adequate post-discharge living arrangements Motivation to continue treatment in a less acute level of care  PRELIMINARY DISCHARGE PLAN: Outpatient therapy Return to previous living arrangement  PATIENT/FAMILY INVOLVEMENT: This treatment plan has been presented to and reviewed with the patient, Gloria Perez.  The patient has been given the opportunity to ask questions and make suggestions.  Ronnie Doss, RN 05/03/2018, 1:52 AM

## 2018-05-03 NOTE — H&P (Signed)
Psychiatric Admission Assessment Adult  Patient Identification: Gloria Perez MRN:  [id number] Date of Evaluation:  05/03/2018 Chief Complaint:  Schizophrenia Principal Diagnosis: <principal problem not specified> Diagnosis:   Patient Active Problem List   Diagnosis Date Noted  . Schizophrenia ([place]) [F20.9] 04/05/2018   History of Present Illness: 57 year old woman came into the emergency room yesterday with complaints of suicidal ideation and possible worsening psychosis.  Her group home reports that her behavior has been different for the last couple weeks more paranoid and withdrawn.  Patient was not able to give much history yesterday but was endorsing suicidal ideation.  On evaluation today she appears more physically unwell.  Her blood pressure is low and she is complaining of feeling dizzy.  Denies acute suicidal intent.  Vague about hallucinations. Associated Signs/Symptoms: Depression Symptoms:  psychomotor retardation, fatigue, suicidal thoughts without plan, (Hypo) Manic Symptoms:  Distractibility, Hallucinations, Anxiety Symptoms:  Excessive Worry, Psychotic Symptoms:  Hallucinations: Auditory PTSD Symptoms: Negative Total Time spent with patient: 1 hour  Past Psychiatric History: Patient has a long history of schizophrenia and has had prior hospitalizations.  Past history of suicide attempts distant.  Chronically managed on Prolixin Decanoate usually pretty stable.  Is the patient at risk to self? Yes.    Has the patient been a risk to self in the past 6 months? No.  Has the patient been a risk to self within the distant past? Yes.    Is the patient a risk to others? No.  Has the patient been a risk to others in the past 6 months? No.  Has the patient been a risk to others within the distant past? No.   Prior Inpatient Therapy:   Prior Outpatient Therapy:    Alcohol Screening: Patient refused Alcohol Screening Tool: Yes 1. How often do you have a drink containing  alcohol?: Never 3. How often do you have six or more drinks on one occasion?: Never Intervention/Follow-up: Patient Refused Substance Abuse History in the last 12 months:  No. Consequences of Substance Abuse: Negative Previous Psychotropic Medications: Yes  Psychological Evaluations: Yes  Past Medical History:  Past Medical History:  Diagnosis Date  . Abnormal thyroid blood test   . Acute left-sided low back pain without sciatica   . Asthma   . B12 deficiency   . CHF (congestive heart failure) ([place])   . Chronic pain   . Contusion of lower back   . COPD (chronic obstructive pulmonary disease) ([place])   . Falling episodes   . GERD (gastroesophageal reflux disease)   . Hepatitis   . Hypertension   . Nonischemic cardiomyopathy ([place])   . Physical debility   . Pre-diabetes   . Schizophrenia ([place])   . Severe obesity (BMI 35.0-35.9 with comorbidity) ([place])   . Vitamin D deficiency   . Weight loss     Past Surgical History:  Procedure Laterality Date  . CESAREAN SECTION    . COLONOSCOPY    . COLONOSCOPY WITH PROPOFOL N/A 02/22/2018   Procedure: COLONOSCOPY WITH PROPOFOL;  Surgeon: Toledo, Benay Pike, MD;  Location: [facility] ENDOSCOPY;  Service: Gastroenterology;  Laterality: N/A;  . Right arm surgery    . TUBAL LIGATION     Family History:  Family History  Problem Relation Age of Onset  . Diabetes Mother    Family Psychiatric  History: Patient denies knowing of any Tobacco Screening: Have you used any form of tobacco in the last 30 days? (Cigarettes, Smokeless Tobacco, Cigars, and/or Pipes): No Social History:  Social History   Substance and Sexual Activity  Alcohol Use Not Currently     Social History   Substance and Sexual Activity  Drug Use Not Currently    Additional Social History:      Pain Medications: See PTA Prescriptions: See PTA Over the Counter: See PTA History of alcohol / drug use?: No history of alcohol / drug abuse Longest period of sobriety (when/how  long): Unknown Negative Consequences of Use: (na) Withdrawal Symptoms: Other (Comment)                    Allergies:   Allergies  Allergen Reactions  . Lisinopril   . Metformin And Related    Lab Results:  Results for orders placed or performed during the hospital encounter of 05/02/18 (from the past 48 hour(s))  Comprehensive metabolic panel     Status: Abnormal   Collection Time: 05/02/18 11:30 AM  Result Value Ref Range   Sodium 136 135 - 145 mmol/L   Potassium 4.3 3.5 - 5.1 mmol/L   Chloride 104 98 - 111 mmol/L    Comment: Please note change in reference range.   CO2 24 22 - 32 mmol/L   Glucose, Bld 104 (H) 70 - 99 mg/dL    Comment: Please note change in reference range.   BUN 13 6 - 20 mg/dL    Comment: Please note change in reference range.   Creatinine, Ser 0.77 0.44 - 1.00 mg/dL   Calcium 9.5 8.9 - 10.3 mg/dL   Total Protein 7.2 6.5 - 8.1 g/dL   Albumin 4.1 3.5 - 5.0 g/dL   AST 25 15 - 41 U/L   ALT 15 0 - 44 U/L    Comment: Please note change in reference range.   Alkaline Phosphatase 73 38 - 126 U/L   Total Bilirubin 0.5 0.3 - 1.2 mg/dL   GFR calc non Af Amer >60 >60 mL/min   GFR calc Af Amer >60 >60 mL/min    Comment: (NOTE) The eGFR has been calculated using the CKD EPI equation. This calculation has not been validated in all clinical situations. eGFR's persistently <60 mL/min signify possible Chronic Kidney Disease.    Anion gap 8 5 - 15    Comment: Performed at [facility], [address] [place], [place] [address]  Ethanol     Status: None   Collection Time: 05/02/18 11:30 AM  Result Value Ref Range   Alcohol, Ethyl (B) <10 <10 mg/dL    Comment: (NOTE) Lowest detectable limit for serum alcohol is 10 mg/dL. For medical purposes only. Performed at [facility], [address] [place], [place] [address]   Salicylate level     Status: None   Collection Time: 05/02/18 11:30 AM  Result Value Ref Range   Salicylate Lvl  <8.2 2.8 - 30.0 mg/dL    Comment: Performed at [facility], [address] [place], [place] [address]  Acetaminophen level     Status: Abnormal   Collection Time: 05/02/18 11:30 AM  Result Value Ref Range   Acetaminophen (Tylenol), Serum <10 (L) 10 - 30 ug/mL    Comment: (NOTE) Therapeutic concentrations vary significantly. A range of 10-30 ug/mL  may be an effective concentration for many patients. However, some  are best treated at concentrations outside of this range. Acetaminophen concentrations >150 ug/mL at 4 hours after ingestion  and >50 ug/mL at 12 hours after ingestion are often associated with  toxic reactions. Performed at [facility], [address] [place], [place] [address]   cbc     Status: Abnormal   Collection Time: 05/02/18 11:30 AM  Result Value Ref Range   WBC 6.7 3.6 - 11.0 K/uL   RBC 3.97 3.80 - 5.20 MIL/uL   Hemoglobin 12.0 12.0 - 16.0 g/dL   HCT 35.9 35.0 - 47.0 %   MCV 90.5 80.0 - 100.0 fL   MCH 30.3 26.0 - 34.0 pg   MCHC 33.5 32.0 - 36.0 g/dL   RDW 15.2 (H) 11.5 - 14.5 %   Platelets 312 150 - 440 K/uL    Comment: Performed at [facility], [address] [place], [place] [address]  Urine Drug Screen, Qualitative     Status: Abnormal   Collection Time: 05/02/18 11:30 AM  Result Value Ref Range   Tricyclic, Ur Screen NONE DETECTED NONE DETECTED   Amphetamines, Ur Screen NONE DETECTED NONE DETECTED   MDMA (Ecstasy)Ur Screen NONE DETECTED NONE DETECTED   Cocaine Metabolite,Ur [place] NONE DETECTED NONE DETECTED   Opiate, Ur Screen NONE DETECTED NONE DETECTED   Phencyclidine (PCP) Ur S NONE DETECTED NONE DETECTED   Cannabinoid 50 Ng, Ur [place] NONE DETECTED NONE DETECTED   Barbiturates, Ur Screen (A) NONE DETECTED    Result not available. Reagent lot number recalled by manufacturer.   Benzodiazepine, Ur Scrn NONE DETECTED NONE DETECTED   Methadone Scn, Ur NONE DETECTED NONE DETECTED    Comment: (NOTE) Tricyclics +  metabolites, urine    Cutoff 1000 ng/mL Amphetamines + metabolites, urine  Cutoff 1000 ng/mL MDMA (Ecstasy), urine              Cutoff 500 ng/mL Cocaine Metabolite, urine          Cutoff 300 ng/mL Opiate + metabolites, urine        Cutoff 300 ng/mL Phencyclidine (PCP), urine         Cutoff 25 ng/mL Cannabinoid, urine                 Cutoff 50 ng/mL Barbiturates + metabolites, urine  Cutoff 200 ng/mL Benzodiazepine, urine              Cutoff 200 ng/mL Methadone, urine                   Cutoff 300 ng/mL The urine drug screen provides only a preliminary, unconfirmed analytical test result and should not be used for non-medical purposes. Clinical consideration and professional judgment should be applied to any positive drug screen result due to possible interfering substances. A more specific alternate chemical method must be used in order to obtain a confirmed analytical result. Gas chromatography / mass spectrometry (GC/MS) is the preferred confirmat ory method. Performed at [facility], [address] [place], [place] [address]     Blood Alcohol level:  Lab Results  Component Value Date   [facility] <10 05/02/2018   ETH <10 11/94/1740    Metabolic Disorder Labs:  No results found for: HGBA1C, MPG No results found for: PROLACTIN Lab Results  Component Value Date   CHOL 121 08/17/2017   TRIG 74 08/17/2017   HDL 48 08/17/2017   CHOLHDL 2.5 08/17/2017   VLDL 15 08/17/2017   LDLCALC 58 08/17/2017    Current Medications: Current Facility-Administered Medications  Medication Dose Route Frequency Provider Last Rate Last Dose  . acetaminophen (TYLENOL) tablet 650 mg  650 mg Oral Q6H PRN Clapacs, Madie Reno, MD   650 mg at  05/03/18 1350  . alum & mag hydroxide-simeth (MAALOX/MYLANTA) 200-200-20 MG/5ML suspension 30 mL  30 mL Oral Q4H PRN Clapacs, John T, MD      . benztropine (COGENTIN) tablet 0.5 mg  0.5 mg Oral BID Clapacs, John T, MD      . buPROPion [facility] SR) 12 hr  tablet 150 mg  150 mg Oral BID Clapacs, John T, MD      . cholecalciferol (VITAMIN D) tablet 1,000 Units  1,000 Units Oral Daily Clapacs, Madie Reno, MD   1,000 Units at 05/03/18 1348  . DULoxetine (CYMBALTA) DR capsule 30 mg  30 mg Oral BID Clapacs, Madie Reno, MD      . fluPHENAZine (PROLIXIN) tablet 10 mg  10 mg Oral QHS Clapacs, John T, MD      . hydrOXYzine (ATARAX/VISTARIL) tablet 25 mg  25 mg Oral TID PRN Clapacs, Madie Reno, MD      . hydrOXYzine (ATARAX/VISTARIL) tablet 25 mg  25 mg Oral QHS Clapacs, John T, MD      . insulin aspart (novoLOG) injection 0-9 Units  0-9 Units Subcutaneous TID WC Clapacs, John T, MD      . magnesium hydroxide (MILK OF MAGNESIA) suspension 30 mL  30 mL Oral Daily PRN Clapacs, John T, MD      . tiotropium (SPIRIVA) inhalation capsule 18 mcg  18 mcg Inhalation Daily Clapacs, Madie Reno, MD   18 mcg at 05/03/18 1349  . traZODone (DESYREL) tablet 100 mg  100 mg Oral QHS PRN Clapacs, John T, MD      . traZODone (DESYREL) tablet 50 mg  50 mg Oral QHS Clapacs, John T, MD       PTA Medications: Medications Prior to Admission  Medication Sig Dispense Refill Last Dose  . benztropine (COGENTIN) 0.5 MG tablet Take 0.5 mg by mouth 2 (two) times daily.   05/02/2018 at 0800  . buPROPion (WELLBUTRIN SR) 150 MG 12 hr tablet Take 150 mg by mouth 2 (two) times daily.   05/02/2018 at 0800  . Cholecalciferol 1000 units tablet Take 1,000 Units by mouth daily.    05/02/2018 at 0800  . DULoxetine (CYMBALTA) 30 MG capsule Take 30 mg by mouth 2 (two) times daily.    05/02/2018 at 0800  . hydrOXYzine (ATARAX/VISTARIL) 25 MG tablet Take 25 mg by mouth every evening.    05/01/2018 at 2000  . lamoTRIgine (LAMICTAL) 25 MG tablet Take 25 mg by mouth 2 (two) times daily.   05/02/2018 at 0800  . lisinopril (PRINIVIL,ZESTRIL) 5 MG tablet Take 5 mg by mouth daily.   05/02/2018 at 0800  . omeprazole (PRILOSEC) 20 MG capsule Take 20 mg by mouth daily.   05/02/2018 at 0800  . tiotropium (SPIRIVA) 18 MCG inhalation  capsule Place 18 mcg into inhaler and inhale daily.   02/22/2018 at Unknown time  . traZODone (DESYREL) 50 MG tablet Take 50 mg by mouth at bedtime.   05/01/2018 at 2000    Musculoskeletal: Strength & Muscle Tone: within normal limits Gait & Station: unsteady Patient leans: N/A  Psychiatric Specialty Exam: Physical Exam  Nursing note and vitals reviewed. Constitutional: She appears well-developed and well-nourished.  Patient is hypotensive currently.  Feeling orthostatic and dizzy when she tries to stand up.  HENT:  Head: Normocephalic and atraumatic.  Eyes: Pupils are equal, round, and reactive to light. Conjunctivae are normal.  Neck: Normal range of motion.  Cardiovascular: Normal heart sounds.  Respiratory: She is in respiratory distress.  GI:  Soft.  Musculoskeletal: Normal range of motion.  Neurological: She is alert.  Skin: Skin is warm and dry.  Psychiatric: Her affect is blunt. Her speech is delayed. She is slowed. She expresses inappropriate judgment. She expresses no homicidal and no suicidal ideation. She exhibits abnormal recent memory.    Review of Systems  Constitutional: Negative.   HENT: Negative.   Eyes: Negative.   Respiratory: Negative.   Cardiovascular: Negative.   Gastrointestinal: Negative.   Musculoskeletal: Negative.   Skin: Negative.   Neurological: Negative.   Psychiatric/Behavioral: Positive for memory loss. Negative for depression, hallucinations, substance abuse and suicidal ideas. The patient has insomnia. The patient is not nervous/anxious.     Blood pressure 99/68, pulse (!) 102, temperature 98.2 F (36.8 C), temperature source Oral, resp. rate 18, height 5' (1.524 m), weight 66.7 kg (147 lb), SpO2 100 %.Body mass index is 28.71 kg/m.  General Appearance: Disheveled  Eye Contact:  Minimal  Speech:  Slow and Slurred  Volume:  Decreased  Mood:  Dysphoric  Affect:  Congruent  Thought Process:  Disorganized  Orientation:  Full (Time, Place,  and Person)  Thought Content:  Illogical  Suicidal Thoughts:  No  Homicidal Thoughts:  No  Memory:  Immediate;   Fair Recent;   Poor Remote;   Poor  Judgement:  Impaired  Insight:  Shallow  Psychomotor Activity:  Decreased  Concentration:  Concentration: Poor  Recall:  Poor  Fund of Knowledge:  Fair  Language:  Fair  Akathisia:  No  Handed:  Right  AIMS (if indicated):     Assets:  Desire for Improvement Housing  ADL's:  Impaired  Cognition:  Impaired,  Mild  Sleep:       Treatment Plan Summary: Daily contact with patient to assess and evaluate symptoms and progress in treatment, Medication management and Plan Patient will be managed on the psychiatric unit on 15-minute checks.  I am ordering some more labs and workup for her weakness.  I am holding off on her blood pressure medicine while we try to get a better handle on what is going on.  Patient encouraged to be eating and drinking fluid regularly.  Treatment team met with the patient today and are all aware of her current situation.  Observation Level/Precautions:  15 minute checks  Laboratory:  EKG  Psychotherapy:    Medications:    Consultations:    Discharge Concerns:    Estimated LOS:  Other:     Physician Treatment Plan for Primary Diagnosis: <principal problem not specified> Long Term Goal(s): Improvement in symptoms so as ready for discharge  Short Term Goals: Ability to verbalize feelings will improve and Ability to disclose and discuss suicidal ideas  Physician Treatment Plan for Secondary Diagnosis: Active Problems:   Schizophrenia ([place])  Long Term Goal(s): Improvement in symptoms so as ready for discharge  Short Term Goals: Ability to maintain clinical measurements within normal limits will improve and Compliance with prescribed medications will improve  I certify that inpatient services furnished can reasonably be expected to improve the patient's condition.    Alethia Berthold, MD 6/26/20194:01 PM

## 2018-05-03 NOTE — Progress Notes (Signed)
Patient ID: Gloria Perez, female   DOB: 1961/09/05, 57 y.o.   MRN: [id number] CSW met with pt and attempted to complete the PSA with her.  Pt was able to provide some information for the assessment, but was very disorganized in her thoughts so it was difficult to obtain much information from pt.   There isn't much clinical history in pt's chart as pt has not had an inpatient hospitalization at this hospital of note.  CSW attempted to contact pt's guardian, Marcelo Baldy, but had to leave a message.  CSW will try to reach pt's guardian on 05/04/18 to complete the PSA, complete SPE, and complete consents.

## 2018-05-03 NOTE — Progress Notes (Signed)
Patient did not receive her coverage for dinner because her CBG was 109.

## 2018-05-03 NOTE — Progress Notes (Signed)
D- Patient alert and oriented. Patient presents in a pleasant mood on assessment stating that she slept well last night stating tot his writer " I slept so good that I don't even remember coming here". Patient had no major complaints at this time. Patient did state to this Probation officer that she has a lot of health issues going on "my liver and kidneys, and I have Hepatitis C". Patient denies SI/HI/AVH and pain at this time. Patient also denies any signs/symptoms of depression/anxiety. Patient's goal for today is to "take it one day at a time".  A- Scheduled medications administered to patient, per MD orders. Support and encouragement provided.  Routine safety checks conducted every 15 minutes.  Patient informed to notify staff with problems or concerns.  R- No adverse drug reactions noted. Patient contracts for safety at this time. Patient compliant with medications and treatment plan. Patient receptive, calm, and cooperative. Patient interacts well with others on the unit.  Patient remains safe at this time.

## 2018-05-03 NOTE — Progress Notes (Signed)
Recreation Therapy Notes   Date: 05/03/2018  Time: 9:30 am  Location: Craft room  Behavioral response: Appropriate   Intervention Topic: Self-Care  Discussion/Intervention: Group content today was focused on Self-Care. The group defined self-care and some positive ways they care for themselves. Individuals expressed ways and reasons why they neglected any self-care in the past. Patients described ways to improve self-care in the future. The group explained what could happen if they did not do any self-care activities at all. The group participated in the intervention "self-care assessment" where they had a chance to discover some of their weaknesses and strengths in self- care. Patient came up with a self-care plan to improve themselves in the future.  Clinical Observations/Feedback:  Patient came to group late due to unknown reasons. She participated in the intervention and was social with staff. Individual had an unsteady gate coming to  and leaving group. Gloria Perez LRT/CTRS         Gloria Perez 05/03/2018 10:57 AM

## 2018-05-03 NOTE — Tx Team (Addendum)
Interdisciplinary Treatment and Diagnostic Plan Update  05/03/2018 Time of Session: 2:15 PM Aqsa Sensabaugh MRN: [id number]  Principal Diagnosis: <principal problem not specified>  Secondary Diagnoses: Active Problems:   Schizophrenia ([place])   Current Medications:  Current Facility-Administered Medications  Medication Dose Route Frequency Provider Last Rate Last Dose  . acetaminophen (TYLENOL) tablet 650 mg  650 mg Oral Q6H PRN Clapacs, Madie [place], MD   650 mg at 05/03/18 1350  . alum & mag hydroxide-simeth (MAALOX/MYLANTA) 200-200-20 MG/5ML suspension 30 mL  30 mL Oral Q4H PRN Clapacs, John T, MD      . benztropine (COGENTIN) tablet 0.5 mg  0.5 mg Oral BID Clapacs, John T, MD      . buPROPion [facility] SR) 12 hr tablet 150 mg  150 mg Oral BID Clapacs, John T, MD      . cholecalciferol (VITAMIN D) tablet 1,000 Units  1,000 Units Oral Daily Clapacs, Madie Reno, MD   1,000 Units at 05/03/18 1348  . DULoxetine (CYMBALTA) DR capsule 30 mg  30 mg Oral BID Clapacs, Madie Reno, MD      . fluPHENAZine (PROLIXIN) tablet 10 mg  10 mg Oral QHS Clapacs, John T, MD      . hydrOXYzine (ATARAX/VISTARIL) tablet 25 mg  25 mg Oral TID PRN Clapacs, Madie Reno, MD      . hydrOXYzine (ATARAX/VISTARIL) tablet 25 mg  25 mg Oral QHS Clapacs, John T, MD      . insulin aspart (novoLOG) injection 0-9 Units  0-9 Units Subcutaneous TID WC Clapacs, John T, MD      . magnesium hydroxide (MILK OF MAGNESIA) suspension 30 mL  30 mL Oral Daily PRN Clapacs, John T, MD      . tiotropium (SPIRIVA) inhalation capsule 18 mcg  18 mcg Inhalation Daily Clapacs, Madie Reno, MD   18 mcg at 05/03/18 1349  . traZODone (DESYREL) tablet 100 mg  100 mg Oral QHS PRN Clapacs, John T, MD      . traZODone (DESYREL) tablet 50 mg  50 mg Oral QHS Clapacs, John T, MD       PTA Medications: Medications Prior to Admission  Medication Sig Dispense Refill Last Dose  . benztropine (COGENTIN) 0.5 MG tablet Take 0.5 mg by mouth 2 (two) times daily.   05/02/2018 at 0800   . buPROPion (WELLBUTRIN SR) 150 MG 12 hr tablet Take 150 mg by mouth 2 (two) times daily.   05/02/2018 at 0800  . Cholecalciferol 1000 units tablet Take 1,000 Units by mouth daily.    05/02/2018 at 0800  . DULoxetine (CYMBALTA) 30 MG capsule Take 30 mg by mouth 2 (two) times daily.    05/02/2018 at 0800  . hydrOXYzine (ATARAX/VISTARIL) 25 MG tablet Take 25 mg by mouth every evening.    05/01/2018 at 2000  . lamoTRIgine (LAMICTAL) 25 MG tablet Take 25 mg by mouth 2 (two) times daily.   05/02/2018 at 0800  . lisinopril (PRINIVIL,ZESTRIL) 5 MG tablet Take 5 mg by mouth daily.   05/02/2018 at 0800  . omeprazole (PRILOSEC) 20 MG capsule Take 20 mg by mouth daily.   05/02/2018 at 0800  . tiotropium (SPIRIVA) 18 MCG inhalation capsule Place 18 mcg into inhaler and inhale daily.   02/22/2018 at Unknown time  . traZODone (DESYREL) 50 MG tablet Take 50 mg by mouth at bedtime.   05/01/2018 at 2000    Patient Stressors: Financial difficulties Health problems  Patient Strengths: Average or above average intelligence Supportive family/friends  Treatment Modalities: Medication Management, Group therapy, Case management,  1 to 1 session with clinician, Psychoeducation, Recreational therapy.   Physician Treatment Plan for Primary Diagnosis: <principal problem not specified> Long Term Goal(s): Improvement in symptoms so as ready for discharge Improvement in symptoms so as ready for discharge   Short Term Goals: Ability to verbalize feelings will improve Ability to disclose and discuss suicidal ideas Ability to maintain clinical measurements within normal limits will improve Compliance with prescribed medications will improve  Medication Management: Evaluate patient's response, side effects, and tolerance of medication regimen.  Therapeutic Interventions: 1 to 1 sessions, Unit Group sessions and Medication administration.  Evaluation of Outcomes: Progressing  Physician Treatment Plan for Secondary  Diagnosis: Active Problems:   Schizophrenia ([place])  Long Term Goal(s): Improvement in symptoms so as ready for discharge Improvement in symptoms so as ready for discharge   Short Term Goals: Ability to verbalize feelings will improve Ability to disclose and discuss suicidal ideas Ability to maintain clinical measurements within normal limits will improve Compliance with prescribed medications will improve     Medication Management: Evaluate patient's response, side effects, and tolerance of medication regimen.  Therapeutic Interventions: 1 to 1 sessions, Unit Group sessions and Medication administration.  Evaluation of Outcomes: Progressing   RN Treatment Plan for Primary Diagnosis: <principal problem not specified> Long Term Goal(s): Knowledge of disease and therapeutic regimen to maintain health will improve  Short Term Goals: Ability to remain free from injury will improve, Ability to disclose and discuss suicidal ideas and Compliance with prescribed medications will improve  Medication Management: RN will administer medications as ordered by provider, will assess and evaluate patient's response and provide education to patient for prescribed medication. RN will report any adverse and/or side effects to prescribing provider.  Therapeutic Interventions: 1 on 1 counseling sessions, Psychoeducation, Medication administration, Evaluate responses to treatment, Monitor vital signs and CBGs as ordered, Perform/monitor CIWA, COWS, AIMS and Fall Risk screenings as ordered, Perform wound care treatments as ordered.  Evaluation of Outcomes: Progressing   LCSW Treatment Plan for Primary Diagnosis: <principal problem not specified> Long Term Goal(s): Safe transition to appropriate next level of care at discharge, Engage patient in therapeutic group addressing interpersonal concerns.  Short Term Goals: Engage patient in aftercare planning with referrals and resources and Increase skills for  wellness and recovery  Therapeutic Interventions: Assess for all discharge needs, 1 to 1 time with Social worker, Explore available resources and support systems, Assess for adequacy in community support network, Educate family and significant other(s) on suicide prevention, Complete Psychosocial Assessment, Interpersonal group therapy.  Evaluation of Outcomes: Progressing   Progress in Treatment: Attending groups: No. Participating in groups: No. Taking medication as prescribed: Yes. Toleration medication: As evidenced by:  Unsure.  Pt has become very weak which may be triggered by her current medications.  This is being further assessed by her attending physician. Family/Significant other contact made: No, will contact:  CSW will contact pt's legal guardian, Marcelo Baldy. Patient understands diagnosis: Yes. Discussing patient identified problems/goals with staff: Yes. Medical problems stabilized or resolved: As evidenced by:  Pt may be having medical issues.  Further assessment is needed. Denies suicidal/homicidal ideation: No. Issues/concerns per patient self-inventory: No. Other: n/a  New problem(s) identified: Yes, Describe:  Pt has become very weak physically.  New Short Term/Long Term Goal(s):  Patient Goals:  "Try to go to groups and behave myself"  Discharge Plan or Barriers: Tentative plan for pt to return to group home and  resume services with her ACT Team.  Reason for Continuation of Hospitalization: Delusions  Medical Issues Medication stabilization Suicidal ideation  Estimated Length of Stay: 3-5 days  Recreational Therapy: Patient Stressors: N/A Patient Goal: Patient will engage in groups without prompting or encouragement from LRT x3 group sessions within 5 recreation therapy group sessions  Attendees: Patient: Tykia Mellone 05/03/2018 5:15 PM  Physician: Alethia Berthold, MD 05/03/2018 5:15 PM  Nursing:  05/03/2018 5:15 PM  RN Care Manager: 05/03/2018 5:15 PM   Social Worker: Derrek Gu, LCSW 05/03/2018 5:15 PM  Recreational Therapist: Roanna Epley, LRT 05/03/2018 5:15 PM  Other:  05/03/2018 5:15 PM  Other:  05/03/2018 5:15 PM  Other: 05/03/2018 5:15 PM    Scribe for Treatment Team: Devona Konig, LCSW 05/03/2018 5:15 PM

## 2018-05-03 NOTE — [facility] Group Notes (Signed)
LCSW Group Therapy Note  05/03/2018 1:00 pm  Type of Therapy/Topic:  Group Therapy:  Balance in Life  Participation Level:  Did Not Attend  Description of Group:    This group will address the concept of balance and how it feels and looks when one is unbalanced. Patients will be encouraged to process areas in their lives that are out of balance and identify reasons for remaining unbalanced. Facilitators will guide patients in utilizing problem-solving interventions to address and correct the stressor making their life unbalanced. Understanding and applying boundaries will be explored and addressed for obtaining and maintaining a balanced life. Patients will be encouraged to explore ways to assertively make their unbalanced needs known to significant others in their lives, using other group members and facilitator for support and feedback.  Therapeutic Goals: 1. Patient will identify two or more emotions or situations they have that consume much of in their lives. 2. Patient will identify signs/triggers that life has become out of balance:  3. Patient will identify two ways to set boundaries in order to achieve balance in their lives:  4. Patient will demonstrate ability to communicate their needs through discussion and/or role plays  Summary of Patient Progress: [place] was invited to today's group, but chose not to attend.     Therapeutic Modalities:   Cognitive Behavioral Therapy Solution-Focused Therapy Assertiveness Training  Devona Konig, [place] 05/03/2018 2:54 PM

## 2018-05-03 NOTE — Progress Notes (Signed)
ADMISSION NOTE: Rolla Servidio is an 57 y.o. female who presents to the ER due to voicing SI. Per the report of the patient, she was brought to the ER due to "passing out in the street and some man helped me..."  Per the patient's [facility] ACT Team [phone]), the last two weeks the patient's behaviors have changed. She's easily agitated and irritable. She believe people are stealing from her and an increase in paranoia. Patient have been in the same [place] for approximately two years and her current behaviors are new for her. Prior to her recent stay with them, she was in the same [place] for approximately ten years. She moved in with her mother, in [place], for two years. Then moved back into [place] and been there now for the two years. Pt is lethargic upon admission to unit.  Pt has had several medications, does not want to answer any questions and wants to sleep.  Pt advised that she needs to be careful getting up d/t hx of falls.  Pt has proper wrist band on for fall category. Pt denies any pain or discomfort, Pt verbally contracts for safety.  Pt oriented to room and bathroom.  Pt verbalizes understanding of fall risks and how to avoid them. Pt remains safe on unit at this time.

## 2018-05-03 NOTE — Progress Notes (Signed)
Patient's Lisinopril was not given because patient's BP was 99/68. MD was in the patient's room when medication was held.

## 2018-05-04 LAB — URINALYSIS, COMPLETE (UACMP) WITH MICROSCOPIC
Bacteria, UA: NONE SEEN
Bilirubin Urine: NEGATIVE
Glucose, UA: NEGATIVE mg/dL
Hgb urine dipstick: NEGATIVE
Ketones, ur: NEGATIVE mg/dL
Nitrite: NEGATIVE
Protein, ur: NEGATIVE mg/dL
SPECIFIC GRAVITY, URINE: 1.009 (ref 1.005–1.030)
pH: 6 (ref 5.0–8.0)

## 2018-05-04 LAB — LIPID PANEL
CHOL/HDL RATIO: 2.4 ratio
CHOLESTEROL: 189 mg/dL (ref 0–200)
HDL: 79 mg/dL (ref >40)
LDL Cholesterol: 83 mg/dL (ref 0–99)
Triglycerides: 136 mg/dL (ref <150)
VLDL: 27 mg/dL (ref 0–40)

## 2018-05-04 LAB — GLUCOSE, CAPILLARY
GLUCOSE-CAPILLARY: 77 mg/dL (ref 70–99)
Glucose-Capillary: 94 mg/dL (ref 70–99)

## 2018-05-04 LAB — TSH: TSH: 0.389 u[IU]/mL (ref 0.350–4.500)

## 2018-05-04 LAB — HEMOGLOBIN A1C
Hgb A1c MFr Bld: 4.8 % (ref 4.8–5.6)
MEAN PLASMA GLUCOSE: 91.06 mg/dL

## 2018-05-04 NOTE — [facility] Group Notes (Signed)
LCSW Group Therapy Note 05/04/2018 9:00 AM  Type of Therapy and Topic:  Group Therapy:  Setting Goals  Participation Level:  Did Not Attend  Description of Group: In this process group, patients discussed using strengths to work toward goals and address challenges.  Patients identified two positive things about themselves and one goal they were working on.  Patients were given the opportunity to share openly and support each other's plan for self-empowerment.  The group discussed the value of gratitude and were encouraged to have a daily reflection of positive characteristics or circumstances.  Patients were encouraged to identify a plan to utilize their strengths to work on current challenges and goals.  Therapeutic Goals 1. Patient will verbalize personal strengths/positive qualities and relate how these can assist with achieving desired personal goals 2. Patients will verbalize affirmation of peers plans for personal change and goal setting 3. Patients will explore the value of gratitude and positive focus as related to successful achievement of goals 4. Patients will verbalize a plan for regular reinforcement of personal positive qualities and circumstances.  Summary of Patient Progress:  Gloria Perez was invited to today's group, but chose not to attend.     Therapeutic Modalities Cognitive Behavioral Therapy Motivational Interviewing    Gloria Perez, [place] 05/04/2018 11:20 AM

## 2018-05-04 NOTE — Progress Notes (Signed)
[facility] MD Progress Note  05/04/2018 7:06 PM Gloria Perez  MRN:  [id number] Subjective: Follow-up for 57 year old woman with schizophrenia.  She is looking much better physically today.  Up out of bed eating normally.  Says that she is feeling sick and tired and achy but vitals are much more stable.  Denies any suicidal thoughts.  Still having some hallucinations. Principal Problem: <principal problem not specified> Diagnosis:   Patient Active Problem List   Diagnosis Date Noted  . Schizophrenia ([place]) [F20.9] 04/05/2018   Total Time spent with patient: 20 minutes  Past Psychiatric History: Long history of schizophrenia with multiple hospitalizations  Past Medical History:  Past Medical History:  Diagnosis Date  . Abnormal thyroid blood test   . Acute left-sided low back pain without sciatica   . Asthma   . B12 deficiency   . CHF (congestive heart failure) ([place])   . Chronic pain   . Contusion of lower back   . COPD (chronic obstructive pulmonary disease) ([place])   . Falling episodes   . GERD (gastroesophageal reflux disease)   . Hepatitis   . Hypertension   . Nonischemic cardiomyopathy ([place])   . Physical debility   . Pre-diabetes   . Schizophrenia ([place])   . Severe obesity (BMI 35.0-35.9 with comorbidity) ([place])   . Vitamin D deficiency   . Weight loss     Past Surgical History:  Procedure Laterality Date  . CESAREAN SECTION    . COLONOSCOPY    . COLONOSCOPY WITH PROPOFOL N/A 02/22/2018   Procedure: COLONOSCOPY WITH PROPOFOL;  Surgeon: Toledo, Benay Pike, MD;  Location: [facility] ENDOSCOPY;  Service: Gastroenterology;  Laterality: N/A;  . Right arm surgery    . TUBAL LIGATION     Family History:  Family History  Problem Relation Age of Onset  . Diabetes Mother    Family Psychiatric  History: None Social History:  Social History   Substance and Sexual Activity  Alcohol Use Not Currently     Social History   Substance and Sexual Activity  Drug Use Not Currently    Social  History   Socioeconomic History  . Marital status: Widowed    Spouse name: Not on file  . Number of children: Not on file  . Years of education: Not on file  . Highest education level: Not on file  Occupational History  . Not on file  Social Needs  . Financial resource strain: Not on file  . Food insecurity:    Worry: Not on file    Inability: Not on file  . Transportation needs:    Medical: Not on file    Non-medical: Not on file  Tobacco Use  . Smoking status: Current Every Day Smoker    Packs/day: 0.50    Years: 35.00    Pack years: 17.50    Types: Cigarettes  . Smokeless tobacco: Never Used  Substance and Sexual Activity  . Alcohol use: Not Currently  . Drug use: Not Currently  . Sexual activity: Not on file  Lifestyle  . Physical activity:    Days per week: Not on file    Minutes per session: Not on file  . Stress: Not on file  Relationships  . Social connections:    Talks on phone: Not on file    Gets together: Not on file    Attends religious service: Not on file    Active member of club or organization: Not on file    Attends meetings of clubs  or organizations: Not on file    Relationship status: Not on file  Other Topics Concern  . Not on file  Social History Narrative  . Not on file   Additional Social History:    Pain Medications: See PTA Prescriptions: See PTA Over the Counter: See PTA History of alcohol / drug use?: No history of alcohol / drug abuse Longest period of sobriety (when/how long): Unknown Negative Consequences of Use: (na) Withdrawal Symptoms: Other (Comment)                    Sleep: Fair  Appetite:  Fair  Current Medications: Current Facility-Administered Medications  Medication Dose Route Frequency Provider Last Rate Last Dose  . acetaminophen (TYLENOL) tablet 650 mg  650 mg Oral Q6H PRN Panagiotis Oelkers, Madie Reno, MD   650 mg at 05/03/18 1350  . alum & mag hydroxide-simeth (MAALOX/MYLANTA) 200-200-20 MG/5ML suspension 30 mL   30 mL Oral Q4H PRN Moyses Pavey T, MD      . benztropine (COGENTIN) tablet 0.5 mg  0.5 mg Oral BID Shaquela Weichert T, MD   0.5 mg at 05/04/18 1705  . buPROPion (WELLBUTRIN SR) 12 hr tablet 150 mg  150 mg Oral BID Eleuterio Dollar, Madie Reno, MD   150 mg at 05/04/18 1705  . cholecalciferol (VITAMIN D) tablet 1,000 Units  1,000 Units Oral Daily Mustaf Antonacci, Madie Reno, MD   1,000 Units at 05/04/18 0905  . DULoxetine (CYMBALTA) DR capsule 30 mg  30 mg Oral BID Tyshika Baldridge, Madie Reno, MD   30 mg at 05/04/18 1709  . fluPHENAZine (PROLIXIN) tablet 10 mg  10 mg Oral QHS Dhiren Azimi, Madie Reno, MD   10 mg at 05/03/18 2204  . hydrOXYzine (ATARAX/VISTARIL) tablet 25 mg  25 mg Oral TID PRN Arnold Depinto T, MD      . hydrOXYzine (ATARAX/VISTARIL) tablet 25 mg  25 mg Oral QHS Jerran Tappan, Madie Reno, MD   25 mg at 05/03/18 2204  . insulin aspart (novoLOG) injection 0-9 Units  0-9 Units Subcutaneous TID WC Chrishon Martino T, MD      . magnesium hydroxide (MILK OF MAGNESIA) suspension 30 mL  30 mL Oral Daily PRN Ausar Georgiou T, MD      . tiotropium (SPIRIVA) inhalation capsule 18 mcg  18 mcg Inhalation Daily Rexton Greulich, Madie Reno, MD   18 mcg at 05/04/18 0906  . traZODone (DESYREL) tablet 100 mg  100 mg Oral QHS PRN Xavious Sharrar, Madie Reno, MD      . traZODone (DESYREL) tablet 50 mg  50 mg Oral QHS Keera Altidor, Madie Reno, MD   50 mg at 05/03/18 2203    Lab Results:  Results for orders placed or performed during the hospital encounter of 05/02/18 (from the past 48 hour(s))  Glucose, capillary     Status: Abnormal   Collection Time: 05/03/18  5:57 PM  Result Value Ref Range   Glucose-Capillary 109 (H) 70 - 99 mg/dL  Urinalysis, Complete w Microscopic     Status: Abnormal   Collection Time: 05/04/18  7:00 AM  Result Value Ref Range   Color, Urine YELLOW (A) YELLOW   APPearance CLEAR (A) CLEAR   Specific Gravity, Urine 1.009 1.005 - 1.030   pH 6.0 5.0 - 8.0   Glucose, UA NEGATIVE NEGATIVE mg/dL   Hgb urine dipstick NEGATIVE NEGATIVE   Bilirubin Urine NEGATIVE NEGATIVE    Ketones, ur NEGATIVE NEGATIVE mg/dL   Protein, ur NEGATIVE NEGATIVE mg/dL   Nitrite NEGATIVE NEGATIVE   Leukocytes,  UA TRACE (A) NEGATIVE   RBC / HPF 0-5 0 - 5 RBC/hpf   WBC, UA 6-10 0 - 5 WBC/hpf   Bacteria, UA NONE SEEN NONE SEEN   Squamous Epithelial / LPF 0-5 0 - 5    Comment: Performed at [facility], [address] [place], [place] [address]  Hemoglobin A1c     Status: None   Collection Time: 05/04/18 10:23 AM  Result Value Ref Range   Hgb A1c MFr Bld 4.8 4.8 - 5.6 %    Comment: (NOTE) Pre diabetes:          5.7%-6.4% Diabetes:              >6.4% Glycemic control for   <7.0% adults with diabetes    Mean Plasma Glucose 91.06 mg/dL    Comment: Performed at [facility] [address] [place], [place] [address]  Lipid panel     Status: None   Collection Time: 05/04/18 10:23 AM  Result Value Ref Range   Cholesterol 189 0 - 200 mg/dL   Triglycerides 136 <150 mg/dL   HDL 79 >40 mg/dL   Total CHOL/HDL Ratio 2.4 RATIO   VLDL 27 0 - 40 mg/dL   LDL Cholesterol 83 0 - 99 mg/dL    Comment:        Total Cholesterol/HDL:CHD Risk Coronary Heart Disease Risk Table                     Men   Women  1/2 Average Risk   3.4   3.3  Average Risk       5.0   4.4  2 X Average Risk   9.6   7.1  3 X Average Risk  23.4   11.0        Use the calculated Patient Ratio above and the CHD Risk Table to determine the patient's CHD Risk.        ATP III CLASSIFICATION (LDL):  <100     mg/dL   Optimal  100-129  mg/dL   Near or Above                    Optimal  130-159  mg/dL   Borderline  160-189  mg/dL   High  >190     mg/dL   Very High Performed at [facility], [address] [place], [place] [address]   TSH     Status: None   Collection Time: 05/04/18 10:23 AM  Result Value Ref Range   TSH 0.389 0.350 - 4.500 uIU/mL    Comment: Performed by a 3rd Generation assay with a functional sensitivity of <=0.01 uIU/mL. Performed at [facility], [address] [place], [place] [address]   Glucose, capillary     Status: None   Collection Time: 05/04/18 11:02 AM  Result Value Ref Range   Glucose-Capillary 94 70 - 99 mg/dL  Glucose, capillary     Status: None   Collection Time: 05/04/18  4:18 PM  Result Value Ref Range   Glucose-Capillary 77 70 - 99 mg/dL    Blood Alcohol level:  Lab Results  Component Value Date   ETH <10 05/02/2018   ETH <10 95/28/4132    Metabolic Disorder Labs: Lab Results  Component Value Date   HGBA1C 4.8 05/04/2018   MPG 91.06 05/04/2018   MPG 88.19 05/02/2018   No results found for: PROLACTIN Lab Results  Component Value Date  CHOL 189 05/04/2018   TRIG 136 05/04/2018   HDL 79 05/04/2018   CHOLHDL 2.4 05/04/2018   VLDL 27 05/04/2018   LDLCALC 83 05/04/2018   LDLCALC 58 08/17/2017    Physical Findings: AIMS: Facial and Oral Movements Muscles of Facial Expression: None, normal Lips and Perioral Area: None, normal Jaw: None, normal Tongue: None, normal,Extremity Movements Upper (arms, wrists, hands, fingers): None, normal Lower (legs, knees, ankles, toes): None, normal, Trunk Movements Neck, shoulders, hips: None, normal, Overall Severity Severity of abnormal movements (highest score from questions above): None, normal Incapacitation due to abnormal movements: None, normal Patient's awareness of abnormal movements (rate only patient's report): No Awareness, Dental Status Current problems with teeth and/or dentures?: Yes Does patient usually wear dentures?: Yes(unable to assess)  CIWA:    COWS:     Musculoskeletal: Strength & Muscle Tone: within normal limits Gait & Station: normal Patient leans: N/A  Psychiatric Specialty Exam: Physical Exam  Nursing note and vitals reviewed. Constitutional: She appears well-developed and well-nourished.  HENT:  Head: Normocephalic and atraumatic.  Eyes: Pupils are equal, round, and reactive to light. Conjunctivae are normal.  Neck: Normal  range of motion.  Cardiovascular: Regular rhythm and normal heart sounds.  Respiratory: Effort normal. No respiratory distress.  GI: Soft.  Musculoskeletal: Normal range of motion.  Neurological: She is alert.  Skin: Skin is warm and dry.  Psychiatric: Her affect is blunt. Her speech is delayed. She is slowed. Thought content is paranoid. Cognition and memory are impaired. She expresses impulsivity. She expresses no homicidal and no suicidal ideation.    Review of Systems  Constitutional: Positive for malaise/fatigue.  HENT: Negative.   Eyes: Negative.   Respiratory: Negative.   Cardiovascular: Negative.   Gastrointestinal: Negative.   Musculoskeletal: Negative.   Skin: Negative.   Neurological: Negative.   Psychiatric/Behavioral: Positive for depression and memory loss. Negative for hallucinations, substance abuse and suicidal ideas. The patient is not nervous/anxious and does not have insomnia.     Blood pressure (!) 146/99, pulse 90, temperature 98.2 F (36.8 C), temperature source Oral, resp. rate 18, height 5' (1.524 m), weight 66.7 kg (147 lb), SpO2 100 %.Body mass index is 28.71 kg/m.  General Appearance: Casual  Eye Contact:  Fair  Speech:  Slow  Volume:  Decreased  Mood:  Dysphoric  Affect:  Congruent  Thought Process:  Goal Directed  Orientation:  Full (Time, Place, and Person)  Thought Content:  Paranoid Ideation  Suicidal Thoughts:  No  Homicidal Thoughts:  No  Memory:  Immediate;   Fair Recent;   Fair Remote;   Fair  Judgement:  Fair  Insight:  Fair  Psychomotor Activity:  Decreased  Concentration:  Concentration: Fair  Recall:  [employer] of Knowledge:  Fair  Language:  Fair  Akathisia:  No  Handed:  Right  AIMS (if indicated):     Assets:  Desire for Improvement Resilience  ADL's:  Impaired  Cognition:  Impaired,  Mild  Sleep:  Number of Hours: 7.75     Treatment Plan Summary: Daily contact with patient to assess and evaluate symptoms and  progress in treatment, Medication management and Plan Medical workup yesterday was not revealing.  Chest x-ray normal.  Today she looks physically better blood pressure more normal not orthostatic.  Continue current psychiatric medicines with possible plan for discharge within the next couple days if we can make arrangements to get her back to her group home  Alethia Berthold, MD 05/04/2018, 7:06 PM
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
## 2018-05-04 NOTE — Progress Notes (Signed)
Recreation Therapy Notes  INPATIENT RECREATION THERAPY ASSESSMENT  Patient Details Name: Anelisse Jacobson MRN: [id number] DOB: 1961/10/31 Today's Date: 05/04/2018       Information Obtained From: Patient  Able to Participate in Assessment/Interview: Yes  Patient Presentation: Responsive  Reason for Admission (Per Patient): Active Symptoms, Suicidal Ideation  Patient Stressors:    Coping Skills:   Isolation, Talk, Other (Comment)(Walk)  Leisure Interests (2+):  (Nothing)  Frequency of Recreation/Participation:    Awareness of Community Resources:  Yes  Community Resources:  Church  Current Use: Yes  If no, Barriers?:    Expressed Interest in [place] of Residence:  [employer]  Patient Main Form of Transportation: Walk  Patient Strengths:  The way I treat others  Patient Identified Areas of Improvement:  N/A  Patient Goal for Hospitalization:  Behave myself try and go to groups  Current SI (including self-harm):  No  Current HI:  No  Current AVH: No  Staff Intervention Plan: Group Attendance, Collaborate with Interdisciplinary Treatment Team  Consent to Intern Participation: N/A  Lansing Sigmon 05/04/2018, 12:59 PM

## 2018-05-04 NOTE — Progress Notes (Signed)
Recreation Therapy Notes  Date: 05/04/2018  Time: 9:30 am   Location: Craft room   Behavioral response: N/A   Intervention Topic: Animal Assisted Therapy  Discussion/Intervention: Patient did not attend group.   Clinical Observations/Feedback:  Patient did not attend group.   Tkai Serfass LRT/CTRS        Billye Pickerel 05/04/2018 11:29 AM

## 2018-05-04 NOTE — Plan of Care (Signed)
Pt calm and cooperative this evening. Pt denies SI/HI. Pt medication compliant. Pt isolative to her room most of the evening. Pt is receptive to treatment and safety maintained on unit. Will continue to monitor.  Problem: Education: Goal: Ability to make informed decisions regarding treatment will improve Outcome: Progressing   Problem: Coping: Goal: Coping ability will improve Outcome: Progressing   Problem: Health Behavior/Discharge Planning: Goal: Identification of resources available to assist in meeting health care needs will improve Outcome: Progressing   Problem: Medication: Goal: Compliance with prescribed medication regimen will improve Outcome: Progressing   Problem: Self-Concept: Goal: Ability to disclose and discuss suicidal ideas will improve Outcome: Progressing Goal: Will verbalize positive feelings about self Outcome: Progressing

## 2018-05-04 NOTE — [facility] Group Notes (Signed)
  05/04/2018  Time: 1PM  Type of Therapy/Topic:  Group Therapy:  Emotion Regulation  Participation Level:  Did Not Attend   Description of Group:    The purpose of this group is to assist patients in learning to regulate negative emotions and experience positive emotions. Patients will be guided to discuss ways in which they have been vulnerable to their negative emotions. These vulnerabilities will be juxtaposed with experiences of positive emotions or situations, and patients will be challenged to use positive emotions to combat negative ones. Special emphasis will be placed on coping with negative emotions in conflict situations, and patients will process healthy conflict resolution skills.  Therapeutic Goals: 1. Patient will identify two positive emotions or experiences to reflect on in order to balance out negative emotions 2. Patient will label two or more emotions that they find the most difficult to experience 3. Patient will demonstrate positive conflict resolution skills through discussion and/or role plays  Summary of Patient Progress: Pt was invited to attend group but chose not to attend. CSW will continue to encourage pt to attend group throughout their admission.    Therapeutic Modalities:   Cognitive Behavioral Therapy Feelings Identification Dialectical Behavioral Therapy  Alden Hipp, MSW, LCSW Clinical Social Worker 05/04/2018 2:02 PM

## 2018-05-05 LAB — GLUCOSE, CAPILLARY
GLUCOSE-CAPILLARY: 101 mg/dL — AB (ref 70–99)
Glucose-Capillary: 155 mg/dL — ABNORMAL HIGH (ref 70–99)
Glucose-Capillary: 80 mg/dL (ref 70–99)

## 2018-05-05 NOTE — Plan of Care (Signed)
Pt had a good evening and out with peers in the milieu. Pt denies SI/HI. Pt requested toiletries to take a shower. Pt was medication compliant. Will continue to monitor. Problem: Education: Goal: Ability to make informed decisions regarding treatment will improve Outcome: Progressing   Problem: Coping: Goal: Coping ability will improve Outcome: Progressing   Problem: Health Behavior/Discharge Planning: Goal: Identification of resources available to assist in meeting health care needs will improve Outcome: Progressing   Problem: Medication: Goal: Compliance with prescribed medication regimen will improve Outcome: Progressing   Problem: Self-Concept: Goal: Ability to disclose and discuss suicidal ideas will improve Outcome: Progressing   Problem: Education: Goal: Utilization of techniques to improve thought processes will improve Outcome: Progressing Goal: Knowledge of the prescribed therapeutic regimen will improve Outcome: Progressing   Problem: Activity: Goal: Interest or engagement in leisure activities will improve Outcome: Progressing   Problem: Health Behavior/Discharge Planning: Goal: Compliance with therapeutic regimen will improve Outcome: Progressing   Problem: Role Relationship: Goal: Will demonstrate positive changes in social behaviors and relationships Outcome: Progressing   Problem: Safety: Goal: Ability to disclose and discuss suicidal ideas will improve Outcome: Progressing Goal: Ability to identify and utilize support systems that promote safety will improve Outcome: Progressing   Problem: Self-Concept: Goal: Will verbalize positive feelings about self Outcome: Progressing Goal: Level of anxiety will decrease Outcome: Progressing

## 2018-05-05 NOTE — [facility] Group Notes (Signed)

## 2018-05-05 NOTE — Progress Notes (Signed)
Recreation Therapy Notes  Date: 05/05/2018  Time: 9:30 am   Location: Outside  Behavioral response: N/A   Intervention Topic: Leisure  Discussion/Intervention: Patient did not attend group.   Clinical Observations/Feedback:  Patient did not attend group.   Marleena Shubert LRT/CTRS        Gloria Perez 05/05/2018 10:14 AM 

## 2018-05-05 NOTE — Plan of Care (Signed)
Patient is alert and oriented denies SI, HI and AVH. Patient complains of being tired today. Patient is compliant with medications, and appropriate of peers and staff. Patient states, " Im not doing to well." When asked to elaborate patient just shook her head. Patient goes to the day room with other peers and is eating meals. Nurse will continue to monitor. Problem: Clinical Measurements: Goal: Ability to maintain clinical measurements within normal limits will improve Outcome: Progressing Goal: Will remain free from infection Outcome: Progressing Goal: Diagnostic test results will improve Outcome: Progressing Goal: Respiratory complications will improve Outcome: Progressing Goal: Cardiovascular complication will be avoided Outcome: Progressing   Problem: Pain Managment: Goal: General experience of comfort will improve Outcome: Progressing

## 2018-05-05 NOTE — Progress Notes (Signed)
[facility] MD Progress Note  05/05/2018 6:08 PM Gloria Perez  MRN:  [id number] Subjective: Follow-up for this 57 year old woman with chronic psychotic disorder.  Patient is up out of her room eating with the group interacting appropriately.  On direct examination she does not have any specific complaints.  She tells me she thinks she has lost a lot of weight in the short time she has been in the hospital but I think she is confused about that.  Patient denies having any suicidal thoughts or homicidal thoughts at this point.  She acknowledges occasional auditory hallucinations but does not appear to be frequently responding to internal stimuli.  She responded positively to the idea of discharge planning Principal Problem: Schizophrenia [facility]) Diagnosis:   Patient Active Problem List   Diagnosis Date Noted  . Schizophrenia ([place]) [F20.9] 04/05/2018   Total Time spent with patient: 20 minutes  Past Psychiatric History: Long-standing schizophrenia with chronic impaired function  Past Medical History:  Past Medical History:  Diagnosis Date  . Abnormal thyroid blood test   . Acute left-sided low back pain without sciatica   . Asthma   . B12 deficiency   . CHF (congestive heart failure) ([place])   . Chronic pain   . Contusion of lower back   . COPD (chronic obstructive pulmonary disease) ([place])   . Falling episodes   . GERD (gastroesophageal reflux disease)   . Hepatitis   . Hypertension   . Nonischemic cardiomyopathy ([place])   . Physical debility   . Pre-diabetes   . Schizophrenia ([place])   . Severe obesity (BMI 35.0-35.9 with comorbidity) ([place])   . Vitamin D deficiency   . Weight loss     Past Surgical History:  Procedure Laterality Date  . CESAREAN SECTION    . COLONOSCOPY    . COLONOSCOPY WITH PROPOFOL N/A 02/22/2018   Procedure: COLONOSCOPY WITH PROPOFOL;  Surgeon: Toledo, Benay Pike, MD;  Location: [facility] ENDOSCOPY;  Service: Gastroenterology;  Laterality: N/A;  . Right arm surgery    . TUBAL  LIGATION     Family History:  Family History  Problem Relation Age of Onset  . Diabetes Mother    Family Psychiatric  History: See previous note Social History:  Social History   Substance and Sexual Activity  Alcohol Use Not Currently     Social History   Substance and Sexual Activity  Drug Use Not Currently    Social History   Socioeconomic History  . Marital status: Widowed    Spouse name: Not on file  . Number of children: Not on file  . Years of education: Not on file  . Highest education level: Not on file  Occupational History  . Not on file  Social Needs  . Financial resource strain: Not on file  . Food insecurity:    Worry: Not on file    Inability: Not on file  . Transportation needs:    Medical: Not on file    Non-medical: Not on file  Tobacco Use  . Smoking status: Current Every Day Smoker    Packs/day: 0.50    Years: 35.00    Pack years: 17.50    Types: Cigarettes  . Smokeless tobacco: Never Used  Substance and Sexual Activity  . Alcohol use: Not Currently  . Drug use: Not Currently  . Sexual activity: Not on file  Lifestyle  . Physical activity:    Days per week: Not on file    Minutes per session: Not on file  .  Stress: Not on file  Relationships  . Social connections:    Talks on phone: Not on file    Gets together: Not on file    Attends religious service: Not on file    Active member of club or organization: Not on file    Attends meetings of clubs or organizations: Not on file    Relationship status: Not on file  Other Topics Concern  . Not on file  Social History Narrative  . Not on file   Additional Social History:    Pain Medications: See PTA Prescriptions: See PTA Over the Counter: See PTA History of alcohol / drug use?: No history of alcohol / drug abuse Longest period of sobriety (when/how long): Unknown Negative Consequences of Use: (na) Withdrawal Symptoms: Other (Comment)                    Sleep:  Fair  Appetite:  Fair  Current Medications: Current Facility-Administered Medications  Medication Dose Route Frequency Provider Last Rate Last Dose  . acetaminophen (TYLENOL) tablet 650 mg  650 mg Oral Q6H PRN Blue Winther, Madie Reno, MD   650 mg at 05/03/18 1350  . alum & mag hydroxide-simeth (MAALOX/MYLANTA) 200-200-20 MG/5ML suspension 30 mL  30 mL Oral Q4H PRN Rishabh Rinkenberger T, MD      . benztropine (COGENTIN) tablet 0.5 mg  0.5 mg Oral BID Nycole Kawahara T, MD   0.5 mg at 05/05/18 1633  . buPROPion (WELLBUTRIN SR) 12 hr tablet 150 mg  150 mg Oral BID Declin Rajan, Madie Reno, MD   150 mg at 05/05/18 1633  . cholecalciferol (VITAMIN D) tablet 1,000 Units  1,000 Units Oral Daily Olanrewaju Osborn, Madie Reno, MD   1,000 Units at 05/05/18 0847  . DULoxetine (CYMBALTA) DR capsule 30 mg  30 mg Oral BID Yaquelin Langelier, Madie Reno, MD   30 mg at 05/05/18 1633  . fluPHENAZine (PROLIXIN) tablet 10 mg  10 mg Oral QHS Bradey Luzier T, MD   10 mg at 05/04/18 2117  . hydrOXYzine (ATARAX/VISTARIL) tablet 25 mg  25 mg Oral TID PRN Lilburn Straw T, MD      . hydrOXYzine (ATARAX/VISTARIL) tablet 25 mg  25 mg Oral QHS Koren Plyler, Madie Reno, MD   25 mg at 05/04/18 2117  . insulin aspart (novoLOG) injection 0-9 Units  0-9 Units Subcutaneous TID WC Hien Cunliffe, Madie Reno, MD   2 Units at 05/05/18 0847  . magnesium hydroxide (MILK OF MAGNESIA) suspension 30 mL  30 mL Oral Daily PRN Dagny Fiorentino T, MD      . tiotropium (SPIRIVA) inhalation capsule 18 mcg  18 mcg Inhalation Daily Sulma Ruffino, Madie Reno, MD   18 mcg at 05/05/18 0854  . traZODone (DESYREL) tablet 100 mg  100 mg Oral QHS PRN Chidinma Clites T, MD      . traZODone (DESYREL) tablet 50 mg  50 mg Oral QHS Nahiem Dredge, Madie Reno, MD   50 mg at 05/04/18 2117    Lab Results:  Results for orders placed or performed during the hospital encounter of 05/02/18 (from the past 48 hour(s))  Urinalysis, Complete w Microscopic     Status: Abnormal   Collection Time: 05/04/18  7:00 AM  Result Value Ref Range   Color, Urine YELLOW  (A) YELLOW   APPearance CLEAR (A) CLEAR   Specific Gravity, Urine 1.009 1.005 - 1.030   pH 6.0 5.0 - 8.0   Glucose, UA NEGATIVE NEGATIVE mg/dL   Hgb urine dipstick NEGATIVE NEGATIVE  Bilirubin Urine NEGATIVE NEGATIVE   Ketones, ur NEGATIVE NEGATIVE mg/dL   Protein, ur NEGATIVE NEGATIVE mg/dL   Nitrite NEGATIVE NEGATIVE   Leukocytes, UA TRACE (A) NEGATIVE   RBC / HPF 0-5 0 - 5 RBC/hpf   WBC, UA 6-10 0 - 5 WBC/hpf   Bacteria, UA NONE SEEN NONE SEEN   Squamous Epithelial / LPF 0-5 0 - 5    Comment: Performed at [facility], [address] [place], [place] [address]  Hemoglobin A1c     Status: None   Collection Time: 05/04/18 10:23 AM  Result Value Ref Range   Hgb A1c MFr Bld 4.8 4.8 - 5.6 %    Comment: (NOTE) Pre diabetes:          5.7%-6.4% Diabetes:              >6.4% Glycemic control for   <7.0% adults with diabetes    Mean Plasma Glucose 91.06 mg/dL    Comment: Performed at [facility] [address] [place], [place] [address]  Lipid panel     Status: None   Collection Time: 05/04/18 10:23 AM  Result Value Ref Range   Cholesterol 189 0 - 200 mg/dL   Triglycerides 136 <150 mg/dL   HDL 79 >40 mg/dL   Total CHOL/HDL Ratio 2.4 RATIO   VLDL 27 0 - 40 mg/dL   LDL Cholesterol 83 0 - 99 mg/dL    Comment:        Total Cholesterol/HDL:CHD Risk Coronary Heart Disease Risk Table                     Men   Women  1/2 Average Risk   3.4   3.3  Average Risk       5.0   4.4  2 X Average Risk   9.6   7.1  3 X Average Risk  23.4   11.0        Use the calculated Patient Ratio above and the CHD Risk Table to determine the patient's CHD Risk.        ATP III CLASSIFICATION (LDL):  <100     mg/dL   Optimal  100-129  mg/dL   Near or Above                    Optimal  130-159  mg/dL   Borderline  160-189  mg/dL   High  >190     mg/dL   Very High Performed at [facility], [address] [place], [place] [address]   TSH     Status: None   Collection  Time: 05/04/18 10:23 AM  Result Value Ref Range   TSH 0.389 0.350 - 4.500 uIU/mL    Comment: Performed by a 3rd Generation assay with a functional sensitivity of <=0.01 uIU/mL. Performed at [facility], [address] [place], [place] [address]   Glucose, capillary     Status: None   Collection Time: 05/04/18 11:02 AM  Result Value Ref Range   Glucose-Capillary 94 70 - 99 mg/dL  Glucose, capillary     Status: None   Collection Time: 05/04/18  4:18 PM  Result Value Ref Range   Glucose-Capillary 77 70 - 99 mg/dL  Glucose, capillary     Status: Abnormal   Collection Time: 05/05/18  8:52 AM  Result Value Ref Range   Glucose-Capillary 155 (H) 70 - 99 mg/dL  Glucose, capillary  Status: Abnormal   Collection Time: 05/05/18 11:41 AM  Result Value Ref Range   Glucose-Capillary 101 (H) 70 - 99 mg/dL  Glucose, capillary     Status: None   Collection Time: 05/05/18  4:37 PM  Result Value Ref Range   Glucose-Capillary 80 70 - 99 mg/dL    Blood Alcohol level:  Lab Results  Component Value Date   ETH <10 05/02/2018   ETH <10 67/61/9509    Metabolic Disorder Labs: Lab Results  Component Value Date   HGBA1C 4.8 05/04/2018   MPG 91.06 05/04/2018   MPG 88.19 05/02/2018   No results found for: PROLACTIN Lab Results  Component Value Date   CHOL 189 05/04/2018   TRIG 136 05/04/2018   HDL 79 05/04/2018   CHOLHDL 2.4 05/04/2018   VLDL 27 05/04/2018   LDLCALC 83 05/04/2018   LDLCALC 58 08/17/2017    Physical Findings: AIMS: Facial and Oral Movements Muscles of Facial Expression: None, normal Lips and Perioral Area: None, normal Jaw: None, normal Tongue: None, normal,Extremity Movements Upper (arms, wrists, hands, fingers): None, normal Lower (legs, knees, ankles, toes): None, normal, Trunk Movements Neck, shoulders, hips: None, normal, Overall Severity Severity of abnormal movements (highest score from questions above): None, normal Incapacitation due to abnormal  movements: None, normal Patient's awareness of abnormal movements (rate only patient's report): No Awareness, Dental Status Current problems with teeth and/or dentures?: Yes Does patient usually wear dentures?: Yes(unable to assess)  CIWA:    COWS:     Musculoskeletal: Strength & Muscle Tone: within normal limits Gait & Station: unsteady Patient leans: N/A  Psychiatric Specialty Exam: Physical Exam  Nursing note and vitals reviewed. Constitutional: She appears well-developed and well-nourished.  HENT:  Head: Normocephalic and atraumatic.  Eyes: Pupils are equal, round, and reactive to light. Conjunctivae are normal.  Neck: Normal range of motion.  Cardiovascular: Normal heart sounds.  Respiratory: Effort normal.  GI: Soft.  Musculoskeletal: Normal range of motion.  Neurological: She is alert.  Skin: Skin is warm and dry.  Psychiatric: Judgment normal. Her affect is blunt. Her speech is delayed. She is slowed. Thought content is not paranoid. Cognition and memory are impaired. She expresses no homicidal and no suicidal ideation.    Review of Systems  Constitutional: Negative.   HENT: Negative.   Eyes: Negative.   Respiratory: Negative.   Cardiovascular: Negative.   Gastrointestinal: Negative.   Musculoskeletal: Negative.   Skin: Negative.   Neurological: Negative.   Psychiatric/Behavioral: Positive for hallucinations and memory loss. Negative for depression, substance abuse and suicidal ideas. The patient is not nervous/anxious and does not have insomnia.     Blood pressure (!) 133/94, pulse (!) 108, temperature 98 F (36.7 C), temperature source Oral, resp. rate 18, height 5' (1.524 m), weight 66.7 kg (147 lb), SpO2 100 %.Body mass index is 28.71 kg/m.  General Appearance: Casual  Eye Contact:  Fair  Speech:  Slow  Volume:  Decreased  Mood:  Dysphoric  Affect:  Constricted  Thought Process:  Disorganized  Orientation:  Full (Time, Place, and Person)  Thought  Content:  Logical  Suicidal Thoughts:  No  Homicidal Thoughts:  No  Memory:  Immediate;   Fair Recent;   Fair Remote;   Fair  Judgement:  Fair  Insight:  Fair  Psychomotor Activity:  Decreased  Concentration:  Concentration: Fair  Recall:  [employer] of Knowledge:  Fair  Language:  Fair  Akathisia:  No  Handed:  Right  AIMS (  if indicated):     Assets:  Desire for Improvement Housing Social Support  ADL's:  Intact  Cognition:  Impaired,  Mild and Moderate  Sleep:  Number of Hours: 7.75     Treatment Plan Summary: Daily contact with patient to assess and evaluate symptoms and progress in treatment, Medication management and Plan Patient seems to have returned to her baseline.  What ever physical problems she had had a few days ago seems to have essentially resolved.  Physically she appears to be doing much better blood pressure stable blood sugars are adequate.  Tolerating her medicine without problems.  Chronic disorganized thinking and negative symptoms.  No evident dangerousness.  I told her we would try planning for discharge by after the weekend and she was agreeable to this.  Social work is engaged in making sure the group home will take her back.  Alethia Berthold, MD 05/05/2018, 6:08 PM

## 2018-05-06 LAB — GLUCOSE, CAPILLARY
GLUCOSE-CAPILLARY: 99 mg/dL (ref 70–99)
Glucose-Capillary: 112 mg/dL — ABNORMAL HIGH (ref 70–99)

## 2018-05-06 NOTE — Plan of Care (Signed)
  Problem: Education: Goal: Ability to make informed decisions regarding treatment will improve Outcome: Progressing  Patient has improved knowledge of treatment options.

## 2018-05-06 NOTE — Plan of Care (Signed)
  Problem: Health Behavior/Discharge Planning: Goal: Ability to manage health-related needs will improve Outcome: Progressing   Problem: Clinical Measurements: Goal: Ability to maintain clinical measurements within normal limits will improve Outcome: Progressing Goal: Will remain free from infection Outcome: Progressing Goal: Diagnostic test results will improve Outcome: Progressing Goal: Respiratory complications will improve Outcome: Progressing Goal: Cardiovascular complication will be avoided Outcome: Progressing   Problem: Activity: Goal: Risk for activity intolerance will decrease Outcome: Progressing   Problem: Nutrition: Goal: Adequate nutrition will be maintained Outcome: Progressing   Problem: Coping: Goal: Level of anxiety will decrease Outcome: Progressing   Problem: Elimination: Goal: Will not experience complications related to bowel motility Outcome: Progressing Goal: Will not experience complications related to urinary retention Outcome: Progressing   Problem: Pain Managment: Goal: General experience of comfort will improve Outcome: Progressing   Problem: Safety: Goal: Ability to remain free from injury will improve Outcome: Progressing   Problem: Skin Integrity: Goal: Risk for impaired skin integrity will decrease Outcome: Progressing   Problem: Education: Goal: Ability to make informed decisions regarding treatment will improve Outcome: Progressing   Problem: Coping: Goal: Coping ability will improve Outcome: Progressing   Problem: Health Behavior/Discharge Planning: Goal: Identification of resources available to assist in meeting health care needs will improve Outcome: Progressing   Problem: Medication: Goal: Compliance with prescribed medication regimen will improve Outcome: Progressing   Problem: Self-Concept: Goal: Ability to disclose and discuss suicidal ideas will improve Outcome: Progressing Goal: Will verbalize positive  feelings about self Outcome: Progressing   Problem: Education: Goal: Utilization of techniques to improve thought processes will improve Outcome: Progressing Goal: Knowledge of the prescribed therapeutic regimen will improve Outcome: Progressing   Problem: Activity: Goal: Interest or engagement in leisure activities will improve Outcome: Progressing Goal: Imbalance in normal sleep/wake cycle will improve Outcome: Progressing   Problem: Coping: Goal: Coping ability will improve Outcome: Progressing Goal: Will verbalize feelings Outcome: Progressing   Problem: Health Behavior/Discharge Planning: Goal: Ability to make decisions will improve Outcome: Progressing Goal: Compliance with therapeutic regimen will improve Outcome: Progressing   Problem: Role Relationship: Goal: Will demonstrate positive changes in social behaviors and relationships Outcome: Progressing   Problem: Safety: Goal: Ability to disclose and discuss suicidal ideas will improve Outcome: Progressing Goal: Ability to identify and utilize support systems that promote safety will improve Outcome: Progressing   Problem: Self-Concept: Goal: Will verbalize positive feelings about self Outcome: Progressing Goal: Level of anxiety will decrease Outcome: Progressing   Problem: Education: Goal: Knowledge of General Education information will improve Outcome: Not Progressing

## 2018-05-06 NOTE — Progress Notes (Signed)
[facility] MD Progress Note  05/06/2018 6:43 PM Gloria Perez  MRN:  [id number]    Subjective:  The patient was calm and withdrawn today. She has been isolative to her room and did not attend any he says her depression is better but is still present. She will not verbalize any specific triggers for anxiety or depression. The patient denies any current active or passive suicidal thoughts and is denying any auditory or visual hallucinations. Affect is sad and withdrawn. She does not want to interact much with staff or peersand wants to stay in the room today. Somatic complaints. She slept over 7 hours last night. Appetite is fair but she missed dinner.  Past Psychiatric History: Patient has a long history of schizophrenia and has had prior hospitalizations.  Past history of suicide attempts distant.  Chronically managed on Prolixin Decanoate usually pretty stable.    Principal Problem: Undifferentiated schizophrenia ([place]) Diagnosis:   Patient Active Problem List   Diagnosis Date Noted  . Undifferentiated schizophrenia ([place]) [F20.3] 04/05/2018   Total Time spent with patient: 20 minutes  Past Psychiatric History: Long-standing schizophrenia with chronic impaired function  Past Medical History:  Past Medical History:  Diagnosis Date  . Abnormal thyroid blood test   . Acute left-sided low back pain without sciatica   . Asthma   . B12 deficiency   . CHF (congestive heart failure) ([place])   . Chronic pain   . Contusion of lower back   . COPD (chronic obstructive pulmonary disease) ([place])   . Falling episodes   . GERD (gastroesophageal reflux disease)   . Hepatitis   . Hypertension   . Nonischemic cardiomyopathy ([place])   . Physical debility   . Pre-diabetes   . Schizophrenia ([place])   . Severe obesity (BMI 35.0-35.9 with comorbidity) ([place])   . Vitamin D deficiency   . Weight loss     Past Surgical History:  Procedure Laterality Date  . CESAREAN SECTION    . COLONOSCOPY    . COLONOSCOPY  WITH PROPOFOL N/A 02/22/2018   Procedure: COLONOSCOPY WITH PROPOFOL;  Surgeon: Toledo, Benay Pike, MD;  Location: [facility] ENDOSCOPY;  Service: Gastroenterology;  Laterality: N/A;  . Right arm surgery    . TUBAL LIGATION     Family History:  Family History  Problem Relation Age of Onset  . Diabetes Mother    Family Psychiatric  History: See previous note Social History:  Social History   Substance and Sexual Activity  Alcohol Use Not Currently     Social History   Substance and Sexual Activity  Drug Use Not Currently    Social History   Socioeconomic History  . Marital status: Widowed    Spouse name: Not on file  . Number of children: Not on file  . Years of education: Not on file  . Highest education level: Not on file  Occupational History  . Not on file  Social Needs  . Financial resource strain: Not on file  . Food insecurity:    Worry: Not on file    Inability: Not on file  . Transportation needs:    Medical: Not on file    Non-medical: Not on file  Tobacco Use  . Smoking status: Current Every Day Smoker    Packs/day: 0.50    Years: 35.00    Pack years: 17.50    Types: Cigarettes  . Smokeless tobacco: Never Used  Substance and Sexual Activity  . Alcohol use: Not Currently  . Drug use:  Not Currently  . Sexual activity: Not on file  Lifestyle  . Physical activity:    Days per week: Not on file    Minutes per session: Not on file  . Stress: Not on file  Relationships  . Social connections:    Talks on phone: Not on file    Gets together: Not on file    Attends religious service: Not on file    Active member of club or organization: Not on file    Attends meetings of clubs or organizations: Not on file    Relationship status: Not on file  Other Topics Concern  . Not on file  Social History Narrative  . Not on file   Additional Social History:    Pain Medications: See PTA Prescriptions: See PTA Over the Counter: See PTA History of alcohol / drug  use?: No history of alcohol / drug abuse Longest period of sobriety (when/how long): Unknown Negative Consequences of Use: (na) Withdrawal Symptoms: Other (Comment)                    Sleep: Fair  Appetite:  Fair  Current Medications: Current Facility-Administered Medications  Medication Dose Route Frequency Provider Last Rate Last Dose  . acetaminophen (TYLENOL) tablet 650 mg  650 mg Oral Q6H PRN Clapacs, Madie Reno, MD   650 mg at 05/06/18 8841  . alum & mag hydroxide-simeth (MAALOX/MYLANTA) 200-200-20 MG/5ML suspension 30 mL  30 mL Oral Q4H PRN Clapacs, John T, MD      . benztropine (COGENTIN) tablet 0.5 mg  0.5 mg Oral BID Clapacs, John T, MD   0.5 mg at 05/06/18 1713  . buPROPion (WELLBUTRIN SR) 12 hr tablet 150 mg  150 mg Oral BID Clapacs, Madie Reno, MD   150 mg at 05/06/18 1713  . cholecalciferol (VITAMIN D) tablet 1,000 Units  1,000 Units Oral Daily Clapacs, Madie Reno, MD   1,000 Units at 05/06/18 [phone]  . DULoxetine (CYMBALTA) DR capsule 30 mg  30 mg Oral BID Clapacs, Madie Reno, MD   30 mg at 05/06/18 1713  . fluPHENAZine (PROLIXIN) tablet 10 mg  10 mg Oral QHS Clapacs, John T, MD   10 mg at 05/05/18 2117  . hydrOXYzine (ATARAX/VISTARIL) tablet 25 mg  25 mg Oral TID PRN Clapacs, John T, MD      . hydrOXYzine (ATARAX/VISTARIL) tablet 25 mg  25 mg Oral QHS Clapacs, Madie Reno, MD   25 mg at 05/05/18 2117  . magnesium hydroxide (MILK OF MAGNESIA) suspension 30 mL  30 mL Oral Daily PRN Clapacs, John T, MD      . tiotropium (SPIRIVA) inhalation capsule 18 mcg  18 mcg Inhalation Daily Clapacs, Madie Reno, MD   18 mcg at 05/06/18 3016  . traZODone (DESYREL) tablet 100 mg  100 mg Oral QHS PRN Clapacs, Madie Reno, MD        Lab Results:  Results for orders placed or performed during the hospital encounter of 05/02/18 (from the past 48 hour(s))  Glucose, capillary     Status: Abnormal   Collection Time: 05/05/18  8:52 AM  Result Value Ref Range   Glucose-Capillary 155 (H) 70 - 99 mg/dL  Glucose,  capillary     Status: Abnormal   Collection Time: 05/05/18 11:41 AM  Result Value Ref Range   Glucose-Capillary 101 (H) 70 - 99 mg/dL  Glucose, capillary     Status: None   Collection Time: 05/05/18  4:37 PM  Result Value  Ref Range   Glucose-Capillary 80 70 - 99 mg/dL  Glucose, capillary     Status: None   Collection Time: 05/06/18  7:17 AM  Result Value Ref Range   Glucose-Capillary 99 70 - 99 mg/dL   Comment 1 Notify RN   Glucose, capillary     Status: Abnormal   Collection Time: 05/06/18 12:55 PM  Result Value Ref Range   Glucose-Capillary 112 (H) 70 - 99 mg/dL    Blood Alcohol level:  Lab Results  Component Value Date   ETH <10 05/02/2018   ETH <10 40/34/7425    Metabolic Disorder Labs: Lab Results  Component Value Date   HGBA1C 4.8 05/04/2018   MPG 91.06 05/04/2018   MPG 88.19 05/02/2018   No results found for: PROLACTIN Lab Results  Component Value Date   CHOL 189 05/04/2018   TRIG 136 05/04/2018   HDL 79 05/04/2018   CHOLHDL 2.4 05/04/2018   VLDL 27 05/04/2018   LDLCALC 83 05/04/2018   LDLCALC 58 08/17/2017    Physical Findings: AIMS: Facial and Oral Movements Muscles of Facial Expression: None, normal Lips and Perioral Area: None, normal Jaw: None, normal Tongue: None, normal,Extremity Movements Upper (arms, wrists, hands, fingers): None, normal Lower (legs, knees, ankles, toes): None, normal, Trunk Movements Neck, shoulders, hips: None, normal, Overall Severity Severity of abnormal movements (highest score from questions above): None, normal Incapacitation due to abnormal movements: None, normal Patient's awareness of abnormal movements (rate only patient's report): No Awareness, Dental Status Current problems with teeth and/or dentures?: Yes Does patient usually wear dentures?: Yes(unable to assess)  CIWA:    COWS:     Musculoskeletal: Strength & Muscle Tone: within normal limits Gait & Station: normal Patient leans: N/A  Psychiatric  Specialty Exam: Physical Exam  Nursing note and vitals reviewed. Psychiatric: Her speech is delayed. She is slowed. Thought content is paranoid. Cognition and memory are impaired. She exhibits a depressed mood. She expresses no suicidal ideation.    Review of Systems  Constitutional: Negative.   HENT: Negative.   Eyes: Negative.   Respiratory: Negative.   Cardiovascular: Negative.   Gastrointestinal: Negative.   Musculoskeletal: Negative.   Skin: Negative.   Neurological: Negative.   Psychiatric/Behavioral: Negative for depression, substance abuse and suicidal ideas. The patient is not nervous/anxious and does not have insomnia.     Blood pressure (!) 121/97, pulse (!) 103, temperature 97.7 F (36.5 C), temperature source Oral, resp. rate 18, height 5' (1.524 m), weight 66.7 kg (147 lb), SpO2 100 %.Body mass index is 28.71 kg/m.  General Appearance: Casual  Eye Contact:  Poor  Speech:  Slow  Volume:  Decreased  Mood:  Depressed  Affect:  Withdrawn  Thought Process:  Disorganized  Orientation:  Full (Time, Place, and Person)  Thought Content:  Logical; brief responses  Suicidal Thoughts:  No  Homicidal Thoughts:  No  Memory:  Immediate;   Fair Recent;   Fair Remote;   Fair  Judgement:  Fair  Insight:  Fair  Psychomotor Activity:  Decreased  Concentration:  Concentration: Fair  Recall:  [employer] of Knowledge:  Fair  Language:  Fair  Akathisia:  No  Handed:  Right  AIMS (if indicated):     Assets:  Desire for Improvement Housing Social Support  ADL's:  Intact  Cognition:  Impaired,  Mild and Moderate  Sleep:  Number of Hours: 7.5     Treatment Plan Summary:   Schizophrenia:  The patient will continue on  current psychotropic medication regimen of Wellbutrin 150 mg by mouth twice a day, Cymbalta will be changed to 60 mg by mouth daily in the morning versus 30 mg by mouth twice a day and Prolixin 10 mg by mouth nightly. The patient also has Cogentin 0.5 mg by  mouth twice a day. Total cholesterol was 189 and hemoglobin A1c was 4.8. EKG showed a QTC of 442.  Asthma:Continue Spirivia  Disposition: The patient will be discharged back to a group home living situation and psychotropic medication management follow-up appointment will be with [facility] ACT team    Chauncey Mann, MD 05/06/2018, 6:43 PM

## 2018-05-06 NOTE — Plan of Care (Signed)
Medication compliant.  Denies SI/HI/AVH. Up for meals.  Tends to isolate to room. Support offered.  Safety rounds maintained.

## 2018-05-06 NOTE — [facility] Group Notes (Signed)
LCSW Group Therapy Note  05/06/2018 1:15pm  Type of Therapy and Topic: Group Therapy: Holding on to Grudges   Participation Level: Did Not Attend   Description of Group:  In this group patients will be asked to explore and define a grudge. Patients will be guided to discuss their thoughts, feelings, and reasons as to why people have grudges. Patients will process the impact grudges have on daily life and identify thoughts and feelings related to holding grudges. Facilitator will challenge patients to identify ways to let go of grudges and the benefits this provides. Patients will be confronted to address why one struggles letting go of grudges. Lastly, patients will identify feelings and thoughts related to what life would look like without grudges. This group will be process-oriented, with patients participating in exploration of their own experiences, giving and receiving support, and processing challenge from other group members.  Therapeutic Goals:  1. Patient will identify specific grudges related to their personal life.  2. Patient will identify feelings, thoughts, and beliefs around grudges.  3. Patient will identify how one releases grudges appropriately.  4. Patient will identify situations where they could have let go of the grudge, but instead chose to hold on.   Summary of Patient Progress: Pt was invited to attend group but chose not to attend. CSW will continue to encourage pt to attend group throughout their admission.    Therapeutic Modalities:  Cognitive Behavioral Therapy  Solution Focused Therapy  Motivational Interviewing  Brief Therapy   Champion Corales  CUEBAS-COLON, LCSW 05/06/2018 12:55 PM

## 2018-05-06 NOTE — Progress Notes (Signed)
Patient alert and oriented x 4, denies SI/HI/AVH, no distress noted, affect is flat and sad but brightens upon approach interacting with peers and staff appropriately. Patient was given support and encouraged to attend evening wrap up group. 15 minutes safety checks maintained will continue to monitor.Marland Kitchen

## 2018-05-07 ENCOUNTER — Encounter: Payer: Self-pay | Admitting: Psychiatry

## 2018-05-07 DIAGNOSIS — F17200 Nicotine dependence, unspecified, uncomplicated: Secondary | ICD-10-CM | POA: Diagnosis present

## 2018-05-07 LAB — GLUCOSE, CAPILLARY
GLUCOSE-CAPILLARY: 86 mg/dL (ref 70–99)
GLUCOSE-CAPILLARY: 100 mg/dL — AB (ref 70–99)
Glucose-Capillary: 113 mg/dL — ABNORMAL HIGH (ref 70–99)

## 2018-05-07 MED ORDER — DULOXETINE HCL 30 MG PO CPEP
30.0000 mg | ORAL_CAPSULE | Freq: Once | ORAL | Status: DC
  Filled 2018-05-07: qty 1

## 2018-05-07 MED ORDER — DULOXETINE HCL 30 MG PO CPEP
60.0000 mg | ORAL_CAPSULE | Freq: Every day | ORAL | Status: DC
  Administered 2018-05-08 – 2018-05-14 (×7): 60 mg via ORAL
  Filled 2018-05-07 (×7): qty 2

## 2018-05-07 NOTE — Progress Notes (Signed)
Patient alert and oriented x 4, denies SI/HI/AVH, no distress noted, affect is flat and sad but brightens upon approach. Patient's thoughts are organized and coherent interacting with peers and staff appropriately. Patient was given support and encouraged to attend evening wrap up group. 15 minutes safety checks maintained will continue to monitor Patient attends evening group and interacted appropriately with staff, will continue to monitor.

## 2018-05-07 NOTE — [facility] Group Notes (Signed)
LCSW Group Therapy Note 05/07/2018 1:15pm  Type of Therapy and Topic: Group Therapy: Feelings Around Returning Home & Establishing a Supportive Framework and Supporting Oneself When Supports Not Available  Participation Level: None  Description of Group:  Patients first processed thoughts and feelings about upcoming discharge. These included fears of upcoming changes, lack of change, new living environments, judgements and expectations from others and overall stigma of mental health issues. The group then discussed the definition of a supportive framework, what that looks and feels like, and how do to discern it from an unhealthy non-supportive network. The group identified different types of supports as well as what to do when your family/friends are less than helpful or unavailable  Therapeutic Goals  1. Patient will identify one healthy supportive network that they can use at discharge. 2. Patient will identify one factor of a supportive framework and how to tell it from an unhealthy network. 3. Patient able to identify one coping skill to use when they do not have positive supports from others. 4. Patient will demonstrate ability to communicate their needs through discussion and/or role plays.  Summary of Patient Progress:  Pt attended group but did not participate.   Therapeutic Modalities Cognitive Behavioral Therapy Motivational Interviewing   Tameron Lama  CUEBAS-COLON, LCSW 05/07/2018 10:39 AM

## 2018-05-07 NOTE — Progress Notes (Signed)
D- Patient alert and oriented. Patient presents in a pleasant mood on assessment stating that she slept "good" last night. Patient denies SI, HI, AVH, and pain at this time. Patient also denies depression/anxiety stating "I'm ok". Patient's goal for today is to "take it one day at a time".  A- Scheduled medications administered to patient, per MD orders. Support and encouragement provided.  Routine safety checks conducted every 15 minutes.  Patient informed to notify staff with problems or concerns.  R- No adverse drug reactions noted. Patient contracts for safety at this time. Patient compliant with medications and treatment plan. Patient receptive, calm, and cooperative. Patient interacts well with others on the unit.  Patient remains safe at this time.

## 2018-05-07 NOTE — Progress Notes (Signed)
[facility] MD Progress Note  05/07/2018 4:32 PM Gloria Perez  MRN:  [id number]    Subjective:  She is pleasant and calm and denies any current active or passive suicidal thoughts.  She denies any auditory or visual hallucinations and does not appear to be responding to internal stimuli.  The patient has been fairly isolative to her room today.  She did not attend her participating groups today or yesterday.  Patient denies any somatic complaints.  She slept over 7 hours last night.  Appetite is fair.  She does not interact much with other patients.  Vital signs are stable but she was mildly tachycardic this morning.  Past Psychiatric History: Patient has a long history of schizophrenia and has had prior hospitalizations.  Past history of suicide attempts distant.  Chronically managed on Prolixin Decanoate usually pretty stable.    Principal Problem: Undifferentiated schizophrenia ([place]) Diagnosis:   Patient Active Problem List   Diagnosis Date Noted  . Undifferentiated schizophrenia ([place]) [F20.3] 04/05/2018   Total Time spent with patient: 20 minutes  Past Psychiatric History: Long-standing schizophrenia with chronic impaired function  Past Medical History:  Past Medical History:  Diagnosis Date  . Abnormal thyroid blood test   . Acute left-sided low back pain without sciatica   . Asthma   . B12 deficiency   . CHF (congestive heart failure) ([place])   . Chronic pain   . Contusion of lower back   . COPD (chronic obstructive pulmonary disease) ([place])   . Falling episodes   . GERD (gastroesophageal reflux disease)   . Hepatitis   . Hypertension   . Nonischemic cardiomyopathy ([place])   . Physical debility   . Pre-diabetes   . Schizophrenia ([place])   . Severe obesity (BMI 35.0-35.9 with comorbidity) ([place])   . Vitamin D deficiency   . Weight loss     Past Surgical History:  Procedure Laterality Date  . CESAREAN SECTION    . COLONOSCOPY    . COLONOSCOPY WITH PROPOFOL N/A 02/22/2018   Procedure: COLONOSCOPY WITH PROPOFOL;  Surgeon: Toledo, Benay Pike, MD;  Location: [facility] ENDOSCOPY;  Service: Gastroenterology;  Laterality: N/A;  . Right arm surgery    . TUBAL LIGATION     Family History:  Family History  Problem Relation Age of Onset  . Diabetes Mother    Family Psychiatric  History: See previous note Social History:  Social History   Substance and Sexual Activity  Alcohol Use Not Currently     Social History   Substance and Sexual Activity  Drug Use Not Currently    Social History   Socioeconomic History  . Marital status: Widowed    Spouse name: Not on file  . Number of children: Not on file  . Years of education: Not on file  . Highest education level: Not on file  Occupational History  . Not on file  Social Needs  . Financial resource strain: Not on file  . Food insecurity:    Worry: Not on file    Inability: Not on file  . Transportation needs:    Medical: Not on file    Non-medical: Not on file  Tobacco Use  . Smoking status: Current Every Day Smoker    Packs/day: 0.50    Years: 35.00    Pack years: 17.50    Types: Cigarettes  . Smokeless tobacco: Never Used  Substance and Sexual Activity  . Alcohol use: Not Currently  . Drug use: Not Currently  . Sexual  activity: Not on file  Lifestyle  . Physical activity:    Days per week: Not on file    Minutes per session: Not on file  . Stress: Not on file  Relationships  . Social connections:    Talks on phone: Not on file    Gets together: Not on file    Attends religious service: Not on file    Active member of club or organization: Not on file    Attends meetings of clubs or organizations: Not on file    Relationship status: Not on file  Other Topics Concern  . Not on file  Social History Narrative  . Not on file   Additional Social History:    Pain Medications: See PTA Prescriptions: See PTA Over the Counter: See PTA History of alcohol / drug use?: No history of alcohol / drug  abuse Longest period of sobriety (when/how long): Unknown Negative Consequences of Use: (na) Withdrawal Symptoms: Other (Comment)                    Sleep: Fair  Appetite:  Fair  Current Medications: Current Facility-Administered Medications  Medication Dose Route Frequency Provider Last Rate Last Dose  . acetaminophen (TYLENOL) tablet 650 mg  650 mg Oral Q6H PRN Clapacs, Madie Reno, MD   650 mg at 05/06/18 1601  . alum & mag hydroxide-simeth (MAALOX/MYLANTA) 200-200-20 MG/5ML suspension 30 mL  30 mL Oral Q4H PRN Clapacs, John T, MD      . benztropine (COGENTIN) tablet 0.5 mg  0.5 mg Oral BID Clapacs, Madie Reno, MD   0.5 mg at 05/07/18 0847  . buPROPion (WELLBUTRIN SR) 12 hr tablet 150 mg  150 mg Oral BID Clapacs, Madie Reno, MD   150 mg at 05/07/18 0847  . cholecalciferol (VITAMIN D) tablet 1,000 Units  1,000 Units Oral Daily Clapacs, Madie Reno, MD   1,000 Units at 05/07/18 0847  . DULoxetine (CYMBALTA) DR capsule 30 mg  30 mg Oral BID Clapacs, Madie Reno, MD   30 mg at 05/07/18 0847  . fluPHENAZine (PROLIXIN) tablet 10 mg  10 mg Oral QHS Clapacs, John T, MD   10 mg at 05/06/18 2129  . hydrOXYzine (ATARAX/VISTARIL) tablet 25 mg  25 mg Oral TID PRN Clapacs, John T, MD      . hydrOXYzine (ATARAX/VISTARIL) tablet 25 mg  25 mg Oral QHS Clapacs, Madie Reno, MD   25 mg at 05/06/18 2129  . magnesium hydroxide (MILK OF MAGNESIA) suspension 30 mL  30 mL Oral Daily PRN Clapacs, John T, MD      . tiotropium (SPIRIVA) inhalation capsule 18 mcg  18 mcg Inhalation Daily Clapacs, Madie Reno, MD   18 mcg at 05/07/18 0846  . traZODone (DESYREL) tablet 100 mg  100 mg Oral QHS PRN Clapacs, Madie Reno, MD        Lab Results:  Results for orders placed or performed during the hospital encounter of 05/02/18 (from the past 48 hour(s))  Glucose, capillary     Status: None   Collection Time: 05/05/18  4:37 PM  Result Value Ref Range   Glucose-Capillary 80 70 - 99 mg/dL  Glucose, capillary     Status: None   Collection Time:  05/06/18  7:17 AM  Result Value Ref Range   Glucose-Capillary 99 70 - 99 mg/dL   Comment 1 Notify RN   Glucose, capillary     Status: Abnormal   Collection Time: 05/06/18 12:55 PM  Result Value  Ref Range   Glucose-Capillary 112 (H) 70 - 99 mg/dL  Glucose, capillary     Status: Abnormal   Collection Time: 05/07/18 10:39 AM  Result Value Ref Range   Glucose-Capillary 113 (H) 70 - 99 mg/dL  Glucose, capillary     Status: None   Collection Time: 05/07/18 11:54 AM  Result Value Ref Range   Glucose-Capillary 86 70 - 99 mg/dL  Glucose, capillary     Status: Abnormal   Collection Time: 05/07/18  4:21 PM  Result Value Ref Range   Glucose-Capillary 100 (H) 70 - 99 mg/dL    Blood Alcohol level:  Lab Results  Component Value Date   ETH <10 05/02/2018   ETH <10 99/24/2683    Metabolic Disorder Labs: Lab Results  Component Value Date   HGBA1C 4.8 05/04/2018   MPG 91.06 05/04/2018   MPG 88.19 05/02/2018   No results found for: PROLACTIN Lab Results  Component Value Date   CHOL 189 05/04/2018   TRIG 136 05/04/2018   HDL 79 05/04/2018   CHOLHDL 2.4 05/04/2018   VLDL 27 05/04/2018   LDLCALC 83 05/04/2018   LDLCALC 58 08/17/2017    Physical Findings: AIMS: Facial and Oral Movements Muscles of Facial Expression: None, normal Lips and Perioral Area: None, normal Jaw: None, normal Tongue: None, normal,Extremity Movements Upper (arms, wrists, hands, fingers): None, normal Lower (legs, knees, ankles, toes): None, normal, Trunk Movements Neck, shoulders, hips: None, normal, Overall Severity Severity of abnormal movements (highest score from questions above): None, normal Incapacitation due to abnormal movements: None, normal Patient's awareness of abnormal movements (rate only patient's report): No Awareness, Dental Status Current problems with teeth and/or dentures?: Yes Does patient usually wear dentures?: Yes(unable to assess)  CIWA:    COWS:     Musculoskeletal: Strength  & Muscle Tone: within normal limits Gait & Station: normal Patient leans: N/A  Psychiatric Specialty Exam: Physical Exam  Nursing note and vitals reviewed. Psychiatric: Thought content normal. Her speech is delayed. She is slowed. She exhibits a depressed mood. She expresses no suicidal ideation.  Insight and judgement are fair    Review of Systems  Constitutional: Negative.   HENT: Negative.   Eyes: Negative.   Respiratory: Negative.   Cardiovascular: Negative.   Gastrointestinal: Negative.   Musculoskeletal: Negative.   Skin: Negative.   Neurological: Negative.   Psychiatric/Behavioral: Negative for depression, substance abuse and suicidal ideas. The patient is not nervous/anxious and does not have insomnia.     Blood pressure 128/82, pulse (!) 106, temperature 98.1 F (36.7 C), temperature source Oral, resp. rate 18, height 5' (1.524 m), weight 66.7 kg (147 lb), SpO2 100 %.Body mass index is 28.71 kg/m.  General Appearance: Casual  Eye Contact:  Fair  Speech:  Slow and soft  Volume:  Decreased  Mood:  " a little better"  Affect:  Depressed  Thought Process:  Tangential  Orientation:  Full (Time, Place, and Person)  Thought Content:  Logical; brief responses  Suicidal Thoughts:  No  Homicidal Thoughts:  No  Memory:  Immediate;   Fair Recent;   Fair Remote;   Fair  Judgement:  Fair  Insight:  Fair  Psychomotor Activity:  Decreased  Concentration:  Concentration: Fair  Recall:  [employer] of Knowledge:  Fair  Language:  Fair  Akathisia:  No  Handed:  Right  AIMS (if indicated):     Assets:  Desire for Improvement Housing Social Support  ADL's:  Intact  Cognition:  Impaired,  Mild and Moderate  Sleep:  Number of Hours: 7.5     Treatment Plan Summary:   Schizophrenia:  The patient will continue on current psychotropic medication regimen of Wellbutrin 150 mg by mouth twice a day, Cymbalta 60mg  po daily and Prolixin 10 mg by mouth nightly. The patient also  has Cogentin 0.5 mg by mouth twice a day. Total cholesterol was 189 and hemoglobin A1c was 4.8. EKG showed a QTC of 442.  Asthma:Continue Spirivia  Disposition: The patient will be discharged back to a group home living situation and psychotropic medication management follow-up appointment will be with [facility] ACT team    Chauncey Mann, MD 05/07/2018, 4:32 PM

## 2018-05-07 NOTE — Plan of Care (Signed)
Patient verbalizes understanding of the general information that's been provided to her and has not voiced any further questions/concerns at this time. Patient has the ability to manage health-related needs and has worked to maintain her diagnostic/clinical levels within normal limits. Patient has remained free from infection as well as any health-related complications. Patient's risk for impaired skin integrity has decreased and her overall comfort level has improved. Patient's risk for activity intolerance has decreased and she has maintained adequate nutrition. Patient denies any signs/symptoms of depression/anxiety stating to this writer "I'm ok". Patient has remained free from infection thus far on the unit. Patient has the ability to identify the available resources available to assist her in meeting her health-related needs. Patient verbalizes understanding of and has been in compliance with her prescribed therapeutic regimen and has also denied SI/HI/AVH at this time. Patient has been present in the milieu for meals and snack times. Patient states that her goal for today is to "take it one day at a time". Patient has demonstrated positive changes in social behaviors and has the ability to utilize support systems that promote safety. Patient remains safe on the unit.  Problem: Education: Goal: Knowledge of General Education information will improve Outcome: Progressing   Problem: Health Behavior/Discharge Planning: Goal: Ability to manage health-related needs will improve Outcome: Progressing   Problem: Clinical Measurements: Goal: Ability to maintain clinical measurements within normal limits will improve Outcome: Progressing Goal: Will remain free from infection Outcome: Progressing Goal: Diagnostic test results will improve Outcome: Progressing Goal: Respiratory complications will improve Outcome: Progressing Goal: Cardiovascular complication will be avoided Outcome: Progressing    Problem: Activity: Goal: Risk for activity intolerance will decrease Outcome: Progressing   Problem: Nutrition: Goal: Adequate nutrition will be maintained Outcome: Progressing   Problem: Coping: Goal: Level of anxiety will decrease Outcome: Progressing   Problem: Elimination: Goal: Will not experience complications related to bowel motility Outcome: Progressing Goal: Will not experience complications related to urinary retention Outcome: Progressing   Problem: Pain Managment: Goal: General experience of comfort will improve Outcome: Progressing   Problem: Safety: Goal: Ability to remain free from injury will improve Outcome: Progressing   Problem: Skin Integrity: Goal: Risk for impaired skin integrity will decrease Outcome: Progressing   Problem: Education: Goal: Ability to make informed decisions regarding treatment will improve Outcome: Progressing   Problem: Coping: Goal: Coping ability will improve Outcome: Progressing   Problem: Health Behavior/Discharge Planning: Goal: Identification of resources available to assist in meeting health care needs will improve Outcome: Progressing   Problem: Medication: Goal: Compliance with prescribed medication regimen will improve Outcome: Progressing   Problem: Self-Concept: Goal: Ability to disclose and discuss suicidal ideas will improve Outcome: Progressing Goal: Will verbalize positive feelings about self Outcome: Progressing   Problem: Education: Goal: Utilization of techniques to improve thought processes will improve Outcome: Progressing Goal: Knowledge of the prescribed therapeutic regimen will improve Outcome: Progressing   Problem: Activity: Goal: Interest or engagement in leisure activities will improve Outcome: Progressing Goal: Imbalance in normal sleep/wake cycle will improve Outcome: Progressing   Problem: Coping: Goal: Coping ability will improve Outcome: Progressing Goal: Will verbalize  feelings Outcome: Progressing   Problem: Health Behavior/Discharge Planning: Goal: Ability to make decisions will improve Outcome: Progressing Goal: Compliance with therapeutic regimen will improve Outcome: Progressing   Problem: Role Relationship: Goal: Will demonstrate positive changes in social behaviors and relationships Outcome: Progressing   Problem: Safety: Goal: Ability to disclose and discuss suicidal ideas will improve Outcome: Progressing Goal: Ability  to identify and utilize support systems that promote safety will improve Outcome: Progressing   Problem: Self-Concept: Goal: Will verbalize positive feelings about self Outcome: Progressing Goal: Level of anxiety will decrease Outcome: Progressing

## 2018-05-07 NOTE — Plan of Care (Signed)
  Problem: Education: Goal: Ability to make informed decisions regarding treatment will improve Outcome: Progressing  Patient aware of ability to make informed decisions.

## 2018-05-07 NOTE — Progress Notes (Signed)
Patient not given the 1645 dose of Cymbalta because it was a duplicate order and patient had already been given this dose. MD notified.

## 2018-05-07 NOTE — [facility] Group Notes (Signed)
[facility] Group Notes:  (Nursing/MHT/Case Management/Adjunct)  Date:  05/07/2018  Time:  10:14 PM  Type of Therapy:  Group Therapy  Participation Level:  Did Not Attend   Barnie Mort 05/07/2018, 10:14 PM

## 2018-05-08 LAB — URINALYSIS, COMPLETE (UACMP) WITH MICROSCOPIC
BILIRUBIN URINE: NEGATIVE
Bacteria, UA: NONE SEEN
GLUCOSE, UA: NEGATIVE mg/dL
HGB URINE DIPSTICK: NEGATIVE
KETONES UR: NEGATIVE mg/dL
Nitrite: NEGATIVE
PH: 6 (ref 5.0–8.0)
Protein, ur: NEGATIVE mg/dL
Specific Gravity, Urine: 1.016 (ref 1.005–1.030)

## 2018-05-08 MED ORDER — CEPHALEXIN 500 MG PO CAPS
500.0000 mg | ORAL_CAPSULE | Freq: Three times a day (TID) | ORAL | Status: DC
  Administered 2018-05-08 – 2018-05-10 (×5): 500 mg via ORAL
  Filled 2018-05-08 (×4): qty 1

## 2018-05-08 MED ORDER — FLUPHENAZINE DECANOATE 25 MG/ML IJ SOLN
50.0000 mg | INTRAMUSCULAR | Status: DC
  Administered 2018-05-08: 50 mg via INTRAMUSCULAR
  Filled 2018-05-08: qty 2

## 2018-05-08 MED ORDER — BUPROPION HCL ER (XL) 150 MG PO TB24
150.0000 mg | ORAL_TABLET | Freq: Every day | ORAL | Status: DC
  Administered 2018-05-09 – 2018-05-14 (×6): 150 mg via ORAL
  Filled 2018-05-08 (×6): qty 1

## 2018-05-08 NOTE — Progress Notes (Signed)
Recreation Therapy Notes  Date: 05/08/2018  Time: 9:30 am   Location: Craft room  Behavioral response: N/A   Intervention Topic: Coping skills  Discussion/Intervention: Patient did not attend group.   Clinical Observations/Feedback:  Patient did not attend group.   Jimesha Rising LRT/CTRS         Shawndell Schillaci 05/08/2018 10:27 AM

## 2018-05-08 NOTE — Plan of Care (Signed)
Patient has improved in all areas, cooperating with medical regimen , expressing no concerns, took her medicines, encourage patient to participate more in groups and assertive with peers, patient understood information provided, verbally contract for safety of self and others , denies SI/HI and patient continue a claim hearing voices but unable to identify what she is hearing, support patient with therapeutic healing.no distress, sleep is continuous. 15 minute rounding is maintained. Problem: Education: Goal: Knowledge of General Education information will improve Outcome: Progressing   Problem: Health Behavior/Discharge Planning: Goal: Ability to manage health-related needs will improve Outcome: Progressing   Problem: Clinical Measurements: Goal: Ability to maintain clinical measurements within normal limits will improve Outcome: Progressing Goal: Will remain free from infection Outcome: Progressing Goal: Diagnostic test results will improve Outcome: Progressing Goal: Respiratory complications will improve Outcome: Progressing Goal: Cardiovascular complication will be avoided Outcome: Progressing   Problem: Activity: Goal: Risk for activity intolerance will decrease Outcome: Progressing   Problem: Nutrition: Goal: Adequate nutrition will be maintained Outcome: Progressing   Problem: Coping: Goal: Level of anxiety will decrease Outcome: Progressing   Problem: Elimination: Goal: Will not experience complications related to bowel motility Outcome: Progressing Goal: Will not experience complications related to urinary retention Outcome: Progressing   Problem: Pain Managment: Goal: General experience of comfort will improve Outcome: Progressing   Problem: Safety: Goal: Ability to remain free from injury will improve Outcome: Progressing   Problem: Skin Integrity: Goal: Risk for impaired skin integrity will decrease Outcome: Progressing   Problem: Education: Goal:  Ability to make informed decisions regarding treatment will improve Outcome: Progressing   Problem: Coping: Goal: Coping ability will improve Outcome: Progressing   Problem: Health Behavior/Discharge Planning: Goal: Identification of resources available to assist in meeting health care needs will improve Outcome: Progressing   Problem: Medication: Goal: Compliance with prescribed medication regimen will improve Outcome: Progressing   Problem: Self-Concept: Goal: Ability to disclose and discuss suicidal ideas will improve Outcome: Progressing Goal: Will verbalize positive feelings about self Outcome: Progressing   Problem: Education: Goal: Utilization of techniques to improve thought processes will improve Outcome: Progressing Goal: Knowledge of the prescribed therapeutic regimen will improve Outcome: Progressing   Problem: Activity: Goal: Interest or engagement in leisure activities will improve Outcome: Progressing Goal: Imbalance in normal sleep/wake cycle will improve Outcome: Progressing   Problem: Coping: Goal: Coping ability will improve Outcome: Progressing Goal: Will verbalize feelings Outcome: Progressing   Problem: Health Behavior/Discharge Planning: Goal: Ability to make decisions will improve Outcome: Progressing Goal: Compliance with therapeutic regimen will improve Outcome: Progressing   Problem: Role Relationship: Goal: Will demonstrate positive changes in social behaviors and relationships Outcome: Progressing   Problem: Safety: Goal: Ability to disclose and discuss suicidal ideas will improve Outcome: Progressing Goal: Ability to identify and utilize support systems that promote safety will improve Outcome: Progressing   Problem: Self-Concept: Goal: Will verbalize positive feelings about self Outcome: Progressing Goal: Level of anxiety will decrease Outcome: Progressing

## 2018-05-08 NOTE — Progress Notes (Signed)
[facility] MD Progress Note  05/08/2018 3:52 PM Gloria Perez  MRN:  [id number]  Subjective:   Gloria Perez is in her room in bed. She hardly answers my questions. She has no complaints but mumbles under her breath as if responding to internal stimuli. She has been compliant with medications. I received information from her ACT team. Last injection of Prolixin dec 18.5 mg was on 6/5.  Principal Problem: Undifferentiated schizophrenia ([place]) Diagnosis:   Patient Active Problem List   Diagnosis Date Noted  . Undifferentiated schizophrenia ([place]) [F20.3] 04/05/2018    Priority: High  . Tobacco use disorder [F17.200] 05/07/2018   Total Time spent with patient: 20 minutes  Past Psychiatric History: schizophrenia.  Past Medical History:  Past Medical History:  Diagnosis Date  . Abnormal thyroid blood test   . Acute left-sided low back pain without sciatica   . Asthma   . B12 deficiency   . CHF (congestive heart failure) ([place])   . Chronic pain   . Contusion of lower back   . COPD (chronic obstructive pulmonary disease) ([place])   . Falling episodes   . GERD (gastroesophageal reflux disease)   . Hepatitis   . Hypertension   . Nonischemic cardiomyopathy ([place])   . Physical debility   . Pre-diabetes   . Schizophrenia ([place])   . Severe obesity (BMI 35.0-35.9 with comorbidity) ([place])   . Vitamin D deficiency   . Weight loss     Past Surgical History:  Procedure Laterality Date  . CESAREAN SECTION    . COLONOSCOPY    . COLONOSCOPY WITH PROPOFOL N/A 02/22/2018   Procedure: COLONOSCOPY WITH PROPOFOL;  Surgeon: Toledo, Benay Pike, MD;  Location: [facility] ENDOSCOPY;  Service: Gastroenterology;  Laterality: N/A;  . Right arm surgery    . TUBAL LIGATION     Family History:  Family History  Problem Relation Age of Onset  . Diabetes Mother    Family Psychiatric  History: none Social History:  Social History   Substance and Sexual Activity  Alcohol Use Not Currently     Social History    Substance and Sexual Activity  Drug Use Not Currently    Social History   Socioeconomic History  . Marital status: Widowed    Spouse name: Not on file  . Number of children: Not on file  . Years of education: Not on file  . Highest education level: Not on file  Occupational History  . Not on file  Social Needs  . Financial resource strain: Not on file  . Food insecurity:    Worry: Not on file    Inability: Not on file  . Transportation needs:    Medical: Not on file    Non-medical: Not on file  Tobacco Use  . Smoking status: Current Every Day Smoker    Packs/day: 0.50    Years: 35.00    Pack years: 17.50    Types: Cigarettes  . Smokeless tobacco: Never Used  Substance and Sexual Activity  . Alcohol use: Not Currently  . Drug use: Not Currently  . Sexual activity: Not on file  Lifestyle  . Physical activity:    Days per week: Not on file    Minutes per session: Not on file  . Stress: Not on file  Relationships  . Social connections:    Talks on phone: Not on file    Gets together: Not on file    Attends religious service: Not on file    Active member  of club or organization: Not on file    Attends meetings of clubs or organizations: Not on file    Relationship status: Not on file  Other Topics Concern  . Not on file  Social History Narrative  . Not on file   Additional Social History:    Pain Medications: See PTA Prescriptions: See PTA Over the Counter: See PTA History of alcohol / drug use?: No history of alcohol / drug abuse Longest period of sobriety (when/how long): Unknown Negative Consequences of Use: (na) Withdrawal Symptoms: Other (Comment)                    Sleep: Fair  Appetite:  Fair  Current Medications: Current Facility-Administered Medications  Medication Dose Route Frequency Provider Last Rate Last Dose  . acetaminophen (TYLENOL) tablet 650 mg  650 mg Oral Q6H PRN Clapacs, Madie Reno, MD   650 mg at 05/06/18 6962  . alum &  mag hydroxide-simeth (MAALOX/MYLANTA) 200-200-20 MG/5ML suspension 30 mL  30 mL Oral Q4H PRN Clapacs, John T, MD      . benztropine (COGENTIN) tablet 0.5 mg  0.5 mg Oral BID Clapacs, John T, MD   0.5 mg at 05/08/18 1007  . buPROPion (WELLBUTRIN SR) 12 hr tablet 150 mg  150 mg Oral BID Clapacs, Madie Reno, MD   150 mg at 05/08/18 1007  . cholecalciferol (VITAMIN D) tablet 1,000 Units  1,000 Units Oral Daily Clapacs, Madie Reno, MD   1,000 Units at 05/08/18 1006  . DULoxetine (CYMBALTA) DR capsule 30 mg  30 mg Oral Once Chauncey Mann, MD      . DULoxetine (CYMBALTA) DR capsule 60 mg  60 mg Oral Daily Chauncey Mann, MD   60 mg at 05/08/18 1007  . fluPHENAZine (PROLIXIN) tablet 10 mg  10 mg Oral QHS Clapacs, John T, MD   10 mg at 05/07/18 2126  . hydrOXYzine (ATARAX/VISTARIL) tablet 25 mg  25 mg Oral TID PRN Clapacs, John T, MD      . hydrOXYzine (ATARAX/VISTARIL) tablet 25 mg  25 mg Oral QHS Clapacs, Madie Reno, MD   25 mg at 05/07/18 2126  . magnesium hydroxide (MILK OF MAGNESIA) suspension 30 mL  30 mL Oral Daily PRN Clapacs, John T, MD      . tiotropium (SPIRIVA) inhalation capsule 18 mcg  18 mcg Inhalation Daily Clapacs, Madie Reno, MD   18 mcg at 05/07/18 0846  . traZODone (DESYREL) tablet 100 mg  100 mg Oral QHS PRN Clapacs, Madie Reno, MD   100 mg at 05/07/18 2125    Lab Results:  Results for orders placed or performed during the hospital encounter of 05/02/18 (from the past 48 hour(s))  Urinalysis, Complete w Microscopic     Status: Abnormal   Collection Time: 05/07/18  6:49 AM  Result Value Ref Range   Color, Urine YELLOW (A) YELLOW   APPearance CLEAR (A) CLEAR   Specific Gravity, Urine 1.016 1.005 - 1.030   pH 6.0 5.0 - 8.0   Glucose, UA NEGATIVE NEGATIVE mg/dL   Hgb urine dipstick NEGATIVE NEGATIVE   Bilirubin Urine NEGATIVE NEGATIVE   Ketones, ur NEGATIVE NEGATIVE mg/dL   Protein, ur NEGATIVE NEGATIVE mg/dL   Nitrite NEGATIVE NEGATIVE   Leukocytes, UA SMALL (A) NEGATIVE   RBC / HPF 0-5 0 - 5  RBC/hpf   WBC, UA 21-50 0 - 5 WBC/hpf   Bacteria, UA NONE SEEN NONE SEEN   Squamous Epithelial / LPF 0-5  0 - 5   Mucus PRESENT     Comment: Performed at [facility], [address] [place], [place] [address]  Glucose, capillary     Status: Abnormal   Collection Time: 05/07/18 10:39 AM  Result Value Ref Range   Glucose-Capillary 113 (H) 70 - 99 mg/dL  Glucose, capillary     Status: None   Collection Time: 05/07/18 11:54 AM  Result Value Ref Range   Glucose-Capillary 86 70 - 99 mg/dL  Glucose, capillary     Status: Abnormal   Collection Time: 05/07/18  4:21 PM  Result Value Ref Range   Glucose-Capillary 100 (H) 70 - 99 mg/dL    Blood Alcohol level:  Lab Results  Component Value Date   ETH <10 05/02/2018   ETH <10 47/42/5956    Metabolic Disorder Labs: Lab Results  Component Value Date   HGBA1C 4.8 05/04/2018   MPG 91.06 05/04/2018   MPG 88.19 05/02/2018   No results found for: PROLACTIN Lab Results  Component Value Date   CHOL 189 05/04/2018   TRIG 136 05/04/2018   HDL 79 05/04/2018   CHOLHDL 2.4 05/04/2018   VLDL 27 05/04/2018   LDLCALC 83 05/04/2018   LDLCALC 58 08/17/2017    Physical Findings: AIMS: Facial and Oral Movements Muscles of Facial Expression: None, normal Lips and Perioral Area: None, normal Jaw: None, normal Tongue: None, normal,Extremity Movements Upper (arms, wrists, hands, fingers): None, normal Lower (legs, knees, ankles, toes): None, normal, Trunk Movements Neck, shoulders, hips: None, normal, Overall Severity Severity of abnormal movements (highest score from questions above): None, normal Incapacitation due to abnormal movements: None, normal Patient's awareness of abnormal movements (rate only patient's report): No Awareness, Dental Status Current problems with teeth and/or dentures?: Yes Does patient usually wear dentures?: Yes(unable to assess)  CIWA:    COWS:     Musculoskeletal: Strength & Muscle Tone: within normal  limits Gait & Station: normal Patient leans: N/A  Psychiatric Specialty Exam: Physical Exam  Nursing note and vitals reviewed. Psychiatric: Her affect is blunt and inappropriate. Her speech is delayed and tangential. She is slowed and withdrawn. Thought content is paranoid and delusional. Cognition and memory are impaired. She expresses impulsivity.    Review of Systems  Neurological: Negative.   Psychiatric/Behavioral: Positive for hallucinations. The patient is nervous/anxious.   All other systems reviewed and are negative.   Blood pressure (!) 126/96, pulse 95, temperature 98.4 F (36.9 C), temperature source Oral, resp. rate 16, height 5' (1.524 m), weight 66.7 kg (147 lb), SpO2 100 %.Body mass index is 28.71 kg/m.  General Appearance: Disheveled  Eye Contact:  Minimal  Speech:  Blocked and Slow  Volume:  Decreased  Mood:  Depressed  Affect:  Flat  Thought Process:  Linear  Orientation:  Full (Time, Place, and Person)  Thought Content:  Hallucinations: Auditory  Suicidal Thoughts:  No  Homicidal Thoughts:  No  Memory:  Immediate;   Poor Recent;   Poor Remote;   Poor  Judgement:  Poor  Insight:  Lacking  Psychomotor Activity:  Psychomotor Retardation  Concentration:  Concentration: Poor and Attention Span: Poor  Recall:  Poor  Fund of Knowledge:  Poor  Language:  Poor  Akathisia:  No  Handed:  Right  AIMS (if indicated):     Assets:  Communication Skills Desire for Improvement Financial Resources/Insurance Housing Physical Health Resilience Social Support  ADL's:  Intact  Cognition:  WNL  Sleep:  Number of Hours: 7.45  Treatment Plan Summary: Daily contact with patient to assess and evaluate symptoms and progress in treatment and Medication management   Gloria Perez is a 57 year old female with a history of schizophrenia admitted for psychotic break.   #Mood/psychosis -continue Prolixin 10 mg nightly -Cogentin 0.5 mg BID -restart Prolixin dec 50 mg  every 2 weeks -continie Wellbutrin 150 mg daily -continue Cymbalta 60 mg dail  #Asthma -continue Spiriva  #Labs -lipid panel and a1C are normal -EKG reviewed, Qtc 442  #Disposition -discharge to her group home -follow up with ACT team       Orson Slick, MD 05/08/2018, 3:52 PM

## 2018-05-08 NOTE — Plan of Care (Signed)
Denies SI/HIAVH.  Medication and group compliant.  Up for meals, good appetite.  No interaction with peers.  Tends to isolate to room.    Mumbles when talking.  Support offered.  Safety rounds maintained.

## 2018-05-08 NOTE — Progress Notes (Addendum)
[facility] MD Progress Note  05/09/2018 4:44 PM Gloria Perez  MRN:  [id number]  Subjective:    Gloria Perez looks much better today and is able to have a conversation. She feels like "someone dragged her through the mud". Slept well. Nurseds reported sever tremor this morning. Patient had difficulties holding a cup at breakfast. She is not shaking now.When asked, she says "sometimes I shake sometimes I don't". She has UTI and urine cx is still pending. We started Keflex last night.  Principal Problem: Undifferentiated schizophrenia ([place]) Diagnosis:   Patient Active Problem List   Diagnosis Date Noted  . Undifferentiated schizophrenia ([place]) [F20.3] 04/05/2018    Priority: High  . Tobacco use disorder [F17.200] 05/07/2018   Total Time spent with patient: 20 minutes  Past Psychiatric History: schizophrenia  Past Medical History:  Past Medical History:  Diagnosis Date  . Abnormal thyroid blood test   . Acute left-sided low back pain without sciatica   . Asthma   . B12 deficiency   . CHF (congestive heart failure) ([place])   . Chronic pain   . Contusion of lower back   . COPD (chronic obstructive pulmonary disease) ([place])   . Falling episodes   . GERD (gastroesophageal reflux disease)   . Hepatitis   . Hypertension   . Nonischemic cardiomyopathy ([place])   . Physical debility   . Pre-diabetes   . Schizophrenia ([place])   . Severe obesity (BMI 35.0-35.9 with comorbidity) ([place])   . Vitamin D deficiency   . Weight loss     Past Surgical History:  Procedure Laterality Date  . CESAREAN SECTION    . COLONOSCOPY    . COLONOSCOPY WITH PROPOFOL N/A 02/22/2018   Procedure: COLONOSCOPY WITH PROPOFOL;  Surgeon: Toledo, Benay Pike, MD;  Location: [facility] ENDOSCOPY;  Service: Gastroenterology;  Laterality: N/A;  . Right arm surgery    . TUBAL LIGATION     Family History:  Family History  Problem Relation Age of Onset  . Diabetes Mother    Family Psychiatric  History: none Social History:  Social  History   Substance and Sexual Activity  Alcohol Use Not Currently     Social History   Substance and Sexual Activity  Drug Use Not Currently    Social History   Socioeconomic History  . Marital status: Widowed    Spouse name: Not on file  . Number of children: Not on file  . Years of education: Not on file  . Highest education level: Not on file  Occupational History  . Not on file  Social Needs  . Financial resource strain: Not on file  . Food insecurity:    Worry: Not on file    Inability: Not on file  . Transportation needs:    Medical: Not on file    Non-medical: Not on file  Tobacco Use  . Smoking status: Current Every Day Smoker    Packs/day: 0.50    Years: 35.00    Pack years: 17.50    Types: Cigarettes  . Smokeless tobacco: Never Used  Substance and Sexual Activity  . Alcohol use: Not Currently  . Drug use: Not Currently  . Sexual activity: Not on file  Lifestyle  . Physical activity:    Days per week: Not on file    Minutes per session: Not on file  . Stress: Not on file  Relationships  . Social connections:    Talks on phone: Not on file    Gets together: Not  on file    Attends religious service: Not on file    Active member of club or organization: Not on file    Attends meetings of clubs or organizations: Not on file    Relationship status: Not on file  Other Topics Concern  . Not on file  Social History Narrative  . Not on file   Additional Social History:    Pain Medications: See PTA Prescriptions: See PTA Over the Counter: See PTA History of alcohol / drug use?: No history of alcohol / drug abuse Longest period of sobriety (when/how long): Unknown Negative Consequences of Use: (na) Withdrawal Symptoms: Other (Comment)                    Sleep: Poor  Appetite:  Good  Current Medications: Current Facility-Administered Medications  Medication Dose Route Frequency Provider Last Rate Last Dose  . acetaminophen (TYLENOL)  tablet 650 mg  650 mg Oral Q6H PRN Clapacs, Madie Reno, MD   650 mg at 05/06/18 1791  . alum & mag hydroxide-simeth (MAALOX/MYLANTA) 200-200-20 MG/5ML suspension 30 mL  30 mL Oral Q4H PRN Clapacs, John T, MD      . amantadine (SYMMETREL) capsule 100 mg  100 mg Oral BID Magdalen Cabana B, MD      . benztropine (COGENTIN) tablet 2 mg  2 mg Oral Daily Joshua Zeringue B, MD   2 mg at 05/09/18 0817  . buPROPion (WELLBUTRIN XL) 24 hr tablet 150 mg  150 mg Oral Daily Tank Difiore B, MD   150 mg at 05/09/18 0915  . cephALEXin (KEFLEX) capsule 500 mg  500 mg Oral Q8H Joaopedro Eschbach B, MD   500 mg at 05/09/18 1417  . cholecalciferol (VITAMIN D) tablet 1,000 Units  1,000 Units Oral Daily Clapacs, Madie Reno, MD   1,000 Units at 05/09/18 0817  . DULoxetine (CYMBALTA) DR capsule 60 mg  60 mg Oral Daily Chauncey Mann, MD   60 mg at 05/09/18 0817  . fluPHENAZine decanoate (PROLIXIN) injection 50 mg  50 mg Intramuscular Q14 Days Nasiah Lehenbauer B, MD   50 mg at 05/08/18 1813  . hydrOXYzine (ATARAX/VISTARIL) tablet 25 mg  25 mg Oral TID PRN Clapacs, John T, MD      . hydrOXYzine (ATARAX/VISTARIL) tablet 25 mg  25 mg Oral QHS Clapacs, Madie Reno, MD   25 mg at 05/08/18 2133  . magnesium hydroxide (MILK OF MAGNESIA) suspension 30 mL  30 mL Oral Daily PRN Clapacs, John T, MD      . tiotropium (SPIRIVA) inhalation capsule 18 mcg  18 mcg Inhalation Daily Clapacs, Madie Reno, MD   18 mcg at 05/09/18 0818  . traZODone (DESYREL) tablet 100 mg  100 mg Oral QHS PRN Clapacs, Madie Reno, MD   100 mg at 05/07/18 2125    Lab Results:  No results found for this or any previous visit (from the past 48 hour(s)).  Blood Alcohol level:  Lab Results  Component Value Date   ETH <10 05/02/2018   ETH <10 50/56/9794    Metabolic Disorder Labs: Lab Results  Component Value Date   HGBA1C 4.8 05/04/2018   MPG 91.06 05/04/2018   MPG 88.19 05/02/2018   No results found for: PROLACTIN Lab Results  Component Value Date    CHOL 189 05/04/2018   TRIG 136 05/04/2018   HDL 79 05/04/2018   CHOLHDL 2.4 05/04/2018   VLDL 27 05/04/2018   LDLCALC 83 05/04/2018   LDLCALC 58 08/17/2017  Physical Findings: AIMS: Facial and Oral Movements Muscles of Facial Expression: None, normal Lips and Perioral Area: None, normal Jaw: None, normal Tongue: None, normal,Extremity Movements Upper (arms, wrists, hands, fingers): None, normal Lower (legs, knees, ankles, toes): None, normal, Trunk Movements Neck, shoulders, hips: None, normal, Overall Severity Severity of abnormal movements (highest score from questions above): None, normal Incapacitation due to abnormal movements: None, normal Patient's awareness of abnormal movements (rate only patient's report): No Awareness, Dental Status Current problems with teeth and/or dentures?: Yes Does patient usually wear dentures?: Yes(unable to assess)  CIWA:    COWS:     Musculoskeletal: Strength & Muscle Tone: within normal limits Gait & Station: normal Patient leans: N/A  Psychiatric Specialty Exam: Physical Exam  Nursing note and vitals reviewed. Psychiatric: Her affect is blunt. Her speech is delayed. She is slowed, withdrawn and actively hallucinating. Thought content is paranoid and delusional. Cognition and memory are impaired. She expresses impulsivity.    Review of Systems  Neurological: Negative.   Psychiatric/Behavioral: Positive for hallucinations.  All other systems reviewed and are negative.   Blood pressure 129/86, pulse (!) 111, temperature 98.2 F (36.8 C), temperature source Oral, resp. rate 18, height 5' (1.524 m), weight 66.7 kg (147 lb), SpO2 94 %.Body mass index is 28.71 kg/m.  General Appearance: Disheveled  Eye Contact:  Poor  Speech:  Slow and Slurred  Volume:  Decreased  Mood:  Dysphoric  Affect:  Congruent  Thought Process:  Irrelevant and Descriptions of Associations: Tangential  Orientation:  Full (Time, Place, and Person)  Thought  Content:  Delusions, Hallucinations: Auditory and Paranoid Ideation  Suicidal Thoughts:  No  Homicidal Thoughts:  No  Memory:  Immediate;   Poor Recent;   Poor Remote;   Poor  Judgement:  Poor  Insight:  Lacking  Psychomotor Activity:  Psychomotor Retardation  Concentration:  Concentration: Poor and Attention Span: Poor  Recall:  Poor  Fund of Knowledge:  Fair  Language:  Poor  Akathisia:  No  Handed:  Right  AIMS (if indicated):     Assets:  Communication Skills Desire for Improvement Financial Resources/Insurance Housing Physical Health Resilience  ADL's:  Intact  Cognition:  Impaired,  Mild  Sleep:  Number of Hours: 7.3     Treatment Plan Summary: Daily contact with patient to assess and evaluate symptoms and progress in treatment and Medication management   Ms. Potteiger is a 57 year old female with a history of schizophrenia admitted for psychotic break. She also has UTI.   #Mood/psychosis -discontinue oral Prolixin -increase Cogentin to 2 mg in AM -start Symmetrel 100 mg BID -continue Prolixin dec injections, received 50 mg on 7/1 -continie Wellbutrin 150 mg daily -continue Cymbalta 60 mg daily  #Asthma -continue Spiriva  #UTI -Cx pending -start Keflex 500 mg TID  #Labs -lipid panel and a1C are normal -EKG reviewed, Qtc 442  #Disposition -discharge to her group home -follow up with ACT team     Orson Slick, MD 05/09/2018, 4:44 PM

## 2018-05-09 MED ORDER — BENZTROPINE MESYLATE 1 MG PO TABS
2.0000 mg | ORAL_TABLET | Freq: Every day | ORAL | Status: DC
  Administered 2018-05-09 – 2018-05-14 (×6): 2 mg via ORAL
  Filled 2018-05-09 (×6): qty 2

## 2018-05-09 MED ORDER — AMANTADINE HCL 100 MG PO CAPS
100.0000 mg | ORAL_CAPSULE | Freq: Two times a day (BID) | ORAL | Status: DC
  Administered 2018-05-09 – 2018-05-14 (×10): 100 mg via ORAL
  Filled 2018-05-09 (×10): qty 1

## 2018-05-09 NOTE — Progress Notes (Signed)
Recreation Therapy Notes  Date: 05/09/2018      Time: 9:30 am  Location: Craft Room  Behavioral response: Appropriate  Intervention Topic: Creative Expressions  Discussion/Intervention: Group content on today was focused on creative expressions. The group defined creative expressions and ways they use creative expressions. Individual identified other positive ways creative expressions can be used and why it is important to express yourself. Patients participated in the intervention "expressive painting", where they had a chance to creatively express themselves. Clinical Observations/Feedback:  Patient came to group late due to unknown reasons. Individual was social with peers and staff while participating in the intervention.  Aysa Larivee LRT/CTRS         Emmagrace Runkel 05/09/2018 1:51 PM

## 2018-05-09 NOTE — Plan of Care (Signed)
Patient is calm and cooperative,pleasant on approach.Denies SI,HI and AVH.Minimal interactions with staff.Patient has tremors while holding the cup.Isolates in the room.Did not attend groups.Compliant with medications.Support and encouragement given.

## 2018-05-09 NOTE — [facility] Group Notes (Signed)
[place] Group Notes:  (Nursing/MHT/Case Management/Adjunct)  Date:  05/09/2018  Time:  9:23 PM  Type of Therapy:  Group Therapy  Participation Level:  Active  Participation Quality:  Appropriate  Affect:  Appropriate  Cognitive:  Appropriate  Insight:  Appropriate  Engagement in Group:  Engaged  Modes of Intervention:  Discussion  Summary of Progress/Problems:  Kandis Fantasia 05/09/2018, 9:23 PM

## 2018-05-09 NOTE — Plan of Care (Addendum)
Visible in the milieu, pleasant on approach; very poor maintenance and disheveled, sloppy, EPS aeb involuntary movements of the mouth and tongue and generalized tremors, antibiotic started as ordered.  Patient slept for Estimated Hours of 7.30; Precautionary checks every 15 minutes for safety maintained, room free of safety hazards, patient sustains no injury or falls during this shift.  Problem: Nutrition: Goal: Adequate nutrition will be maintained Outcome: Progressing   Problem: Coping: Goal: Level of anxiety will decrease Outcome: Progressing   Problem: Safety: Goal: Ability to remain free from injury will improve Outcome: Progressing   Problem: Self-Concept: Goal: Will verbalize positive feelings about self Outcome: Progressing   Problem: Coping: Goal: Will verbalize feelings Outcome: Progressing   Problem: Health Behavior/Discharge Planning: Goal: Compliance with therapeutic regimen will improve Outcome: Progressing

## 2018-05-09 NOTE — [facility] Group Notes (Signed)
CSW Group Therapy Note  05/09/2018  Time:  0900  Type of Therapy and Topic: Group Therapy: Goals Group: SMART Goals    Participation Level:  Did Not Attend    Description of Group:   The purpose of a daily goals group is to assist and guide patients in setting recovery/wellness-related goals. The objective is to set goals as they relate to the crisis in which they were admitted. Patients will be using SMART goal modalities to set measurable goals. Characteristics of realistic goals will be discussed and patients will be assisted in setting and processing how one will reach their goal. Facilitator will also assist patients in applying interventions and coping skills learned in psycho-education groups to the SMART goal and process how one will achieve defined goal.    Therapeutic Goals:  -Patients will develop and document one goal related to or their crisis in which brought them into treatment.  -Patients will be guided by LCSW using SMART goal setting modality in how to set a measurable, attainable, realistic and time sensitive goal.  -Patients will process barriers in reaching goal.  -Patients will process interventions in how to overcome and successful in reaching goal.    Patient's Goal:  Pt was invited to attend group but chose not to attend. CSW will continue to encourage pt to attend group throughout their admission.   Therapeutic Modalities:  Motivational Interviewing  Cognitive Behavioral Therapy  Crisis Intervention Model  SMART goals setting  Alden Hipp, MSW, LCSW Clinical Social Worker 05/09/2018 9:53 AM

## 2018-05-09 NOTE — [facility] Group Notes (Signed)
05/09/2018 1PM  Type of Therapy/Topic:  Group Therapy:  Feelings about Diagnosis  Participation Level:  Did Not Attend   Description of Group:   This group will allow patients to explore their thoughts and feelings about diagnoses they have received. Patients will be guided to explore their level of understanding and acceptance of these diagnoses. Facilitator will encourage patients to process their thoughts and feelings about the reactions of others to their diagnosis and will guide patients in identifying ways to discuss their diagnosis with significant others in their lives. This group will be process-oriented, with patients participating in exploration of their own experiences, giving and receiving support, and processing challenge from other group members.   Therapeutic Goals: 1. Patient will demonstrate understanding of diagnosis as evidenced by identifying two or more symptoms of the disorder 2. Patient will be able to express two feelings regarding the diagnosis 3. Patient will demonstrate their ability to communicate their needs through discussion and/or role play  Summary of Patient Progress: Patient was encouraged and invited to attend group. Patient did not attend group. Social worker will continue to encourage group participation in the future.        Therapeutic Modalities:   Cognitive Behavioral Therapy Brief Therapy Feelings Identification    Darin Engels, [place] 05/09/2018 3:27 PM

## 2018-05-10 LAB — URINE CULTURE: Culture: >=100000 — AB

## 2018-05-10 NOTE — Progress Notes (Signed)
[facility] MD Progress Note  05/11/2018 11:06 AM Gloria Perez  MRN:  [id number]  Subjective:   Gloria Perez feels much better today. She is awake and conversational. She has no complaints today. She however fell earlier this morning and hit her head. We will order head CT scan and get PT evaluation. She denies any psychotic symptoms or side effects from medications.   Principal Problem: Undifferentiated schizophrenia ([place]) Diagnosis:   Patient Active Problem List   Diagnosis Date Noted  . Undifferentiated schizophrenia ([place]) [F20.3] 04/05/2018    Priority: High  . Tobacco use disorder [F17.200] 05/07/2018   Total Time spent with patient: 20 minutes  Past Psychiatric History: schizophrenia  Past Medical History:  Past Medical History:  Diagnosis Date  . Abnormal thyroid blood test   . Acute left-sided low back pain without sciatica   . Asthma   . B12 deficiency   . CHF (congestive heart failure) ([place])   . Chronic pain   . Contusion of lower back   . COPD (chronic obstructive pulmonary disease) ([place])   . Falling episodes   . GERD (gastroesophageal reflux disease)   . Hepatitis   . Hypertension   . Nonischemic cardiomyopathy ([place])   . Physical debility   . Pre-diabetes   . Schizophrenia ([place])   . Severe obesity (BMI 35.0-35.9 with comorbidity) ([place])   . Vitamin D deficiency   . Weight loss     Past Surgical History:  Procedure Laterality Date  . CESAREAN SECTION    . COLONOSCOPY    . COLONOSCOPY WITH PROPOFOL N/A 02/22/2018   Procedure: COLONOSCOPY WITH PROPOFOL;  Surgeon: Toledo, Benay Pike, MD;  Location: [facility] ENDOSCOPY;  Service: Gastroenterology;  Laterality: N/A;  . Right arm surgery    . TUBAL LIGATION     Family History:  Family History  Problem Relation Age of Onset  . Diabetes Mother    Family Psychiatric  History: none Social History:  Social History   Substance and Sexual Activity  Alcohol Use Not Currently     Social History   Substance and Sexual  Activity  Drug Use Not Currently    Social History   Socioeconomic History  . Marital status: Widowed    Spouse name: Not on file  . Number of children: Not on file  . Years of education: Not on file  . Highest education level: Not on file  Occupational History  . Not on file  Social Needs  . Financial resource strain: Not on file  . Food insecurity:    Worry: Not on file    Inability: Not on file  . Transportation needs:    Medical: Not on file    Non-medical: Not on file  Tobacco Use  . Smoking status: Current Every Day Smoker    Packs/day: 0.50    Years: 35.00    Pack years: 17.50    Types: Cigarettes  . Smokeless tobacco: Never Used  Substance and Sexual Activity  . Alcohol use: Not Currently  . Drug use: Not Currently  . Sexual activity: Not on file  Lifestyle  . Physical activity:    Days per week: Not on file    Minutes per session: Not on file  . Stress: Not on file  Relationships  . Social connections:    Talks on phone: Not on file    Gets together: Not on file    Attends religious service: Not on file    Active member of club or organization:  Not on file    Attends meetings of clubs or organizations: Not on file    Relationship status: Not on file  Other Topics Concern  . Not on file  Social History Narrative  . Not on file   Additional Social History:    Pain Medications: See PTA Prescriptions: See PTA Over the Counter: See PTA History of alcohol / drug use?: No history of alcohol / drug abuse Longest period of sobriety (when/how long): Unknown Negative Consequences of Use: (na) Withdrawal Symptoms: Other (Comment)                    Sleep: Fair  Appetite:  Fair  Current Medications: Current Facility-Administered Medications  Medication Dose Route Frequency Provider Last Rate Last Dose  . acetaminophen (TYLENOL) tablet 650 mg  650 mg Oral Q6H PRN Clapacs, Madie Reno, MD   650 mg at 05/11/18 0814  . alum & mag hydroxide-simeth  (MAALOX/MYLANTA) 200-200-20 MG/5ML suspension 30 mL  30 mL Oral Q4H PRN Clapacs, John T, MD      . amantadine (SYMMETREL) capsule 100 mg  100 mg Oral BID Christyna Letendre B, MD   100 mg at 05/11/18 0814  . benztropine (COGENTIN) tablet 2 mg  2 mg Oral Daily Kerolos Nehme B, MD   2 mg at 05/11/18 0814  . buPROPion (WELLBUTRIN XL) 24 hr tablet 150 mg  150 mg Oral Daily Anamarie Hunn B, MD   150 mg at 05/11/18 0814  . cholecalciferol (VITAMIN D) tablet 1,000 Units  1,000 Units Oral Daily Clapacs, Madie Reno, MD   1,000 Units at 05/11/18 [phone]  . DULoxetine (CYMBALTA) DR capsule 60 mg  60 mg Oral Daily Chauncey Mann, MD   60 mg at 05/11/18 0815  . fluPHENAZine decanoate (PROLIXIN) injection 50 mg  50 mg Intramuscular Q14 Days Mishelle Hassan B, MD   50 mg at 05/08/18 1813  . hydrOXYzine (ATARAX/VISTARIL) tablet 25 mg  25 mg Oral QHS Clapacs, Madie Reno, MD   25 mg at 05/10/18 2154  . magnesium hydroxide (MILK OF MAGNESIA) suspension 30 mL  30 mL Oral Daily PRN Clapacs, John T, MD      . tiotropium (SPIRIVA) inhalation capsule 18 mcg  18 mcg Inhalation Daily Clapacs, Madie Reno, MD   18 mcg at 05/11/18 0815  . traZODone (DESYREL) tablet 100 mg  100 mg Oral QHS PRN Clapacs, Madie Reno, MD   100 mg at 05/10/18 2154    Lab Results: No results found for this or any previous visit (from the past 48 hour(s)).  Blood Alcohol level:  Lab Results  Component Value Date   ETH <10 05/02/2018   ETH <10 75/91/6384    Metabolic Disorder Labs: Lab Results  Component Value Date   HGBA1C 4.8 05/04/2018   MPG 91.06 05/04/2018   MPG 88.19 05/02/2018   No results found for: PROLACTIN Lab Results  Component Value Date   CHOL 189 05/04/2018   TRIG 136 05/04/2018   HDL 79 05/04/2018   CHOLHDL 2.4 05/04/2018   VLDL 27 05/04/2018   LDLCALC 83 05/04/2018   LDLCALC 58 08/17/2017    Physical Findings: AIMS: Facial and Oral Movements Muscles of Facial Expression: None, normal Lips and Perioral Area:  None, normal Jaw: None, normal Tongue: None, normal,Extremity Movements Upper (arms, wrists, hands, fingers): None, normal Lower (legs, knees, ankles, toes): None, normal, Trunk Movements Neck, shoulders, hips: None, normal, Overall Severity Severity of abnormal movements (highest score from questions above): None,  normal Incapacitation due to abnormal movements: None, normal Patient's awareness of abnormal movements (rate only patient's report): No Awareness, Dental Status Current problems with teeth and/or dentures?: Yes Does patient usually wear dentures?: Yes(unable to assess)  CIWA:    COWS:     Musculoskeletal: Strength & Muscle Tone: within normal limits Gait & Station: normal Patient leans: N/A  Psychiatric Specialty Exam: Physical Exam  Nursing note and vitals reviewed. Psychiatric: Her affect is blunt. Her speech is delayed. She is slowed, withdrawn and actively hallucinating. Thought content is paranoid and delusional. Cognition and memory are impaired. She expresses impulsivity.    Review of Systems  Neurological: Negative.   Psychiatric/Behavioral: Positive for hallucinations.  All other systems reviewed and are negative.   Blood pressure 124/82, pulse 91, temperature 98.1 F (36.7 C), temperature source Oral, resp. rate 18, height 5' (1.524 m), weight 66.7 kg (147 lb), SpO2 100 %.Body mass index is 28.71 kg/m.  General Appearance: Disheveled  Eye Contact:  Minimal  Speech:  Slow  Volume:  Decreased  Mood:  Depressed  Affect:  Flat  Thought Process:  Irrelevant and Descriptions of Associations: Tangential  Orientation:  Full (Time, Place, and Person)  Thought Content:  Delusions, Hallucinations: Auditory and Paranoid Ideation  Suicidal Thoughts:  No  Homicidal Thoughts:  No  Memory:  Immediate;   Poor Recent;   Poor Remote;   Poor  Judgement:  Poor  Insight:  Lacking  Psychomotor Activity:  Psychomotor Retardation  Concentration:  Concentration: Poor and  Attention Span: Poor  Recall:  Poor  Fund of Knowledge:  Poor  Language:  Poor  Akathisia:  No  Handed:  Right  AIMS (if indicated):     Assets:  Communication Skills Desire for Improvement Financial Resources/Insurance Housing Physical Health Resilience  ADL's:  Intact  Cognition:  WNL  Sleep:  Number of Hours: 7.3     Treatment Plan Summary: Daily contact with patient to assess and evaluate symptoms and progress in treatment and Medication management   Gloria Perez is a 57 year old female with a history of schizophrenia admitted for psychotic break. She is improving slowly.   #Mood/psychosis -continueProlixin decinjections, received50 mgon 7/1 -continue Cogentinto 2 mg in AM -continue Symmetrel 100 mg BID -continie Wellbutrin 150 mg daily -continue Cymbalta 60 mg daily  #Asthma -continue Spiriva  #UTI -completed a course of Keflex -repeat UA  #Fall -head CT scan -walker -PT evaluation  #Labs -lipid panel and A1C are normal -EKG reviewed, Qtc 442  #Disposition -discharge to her group home -follow up with ACT team     Orson Slick, MD 05/11/2018, 11:06 AM

## 2018-05-10 NOTE — Progress Notes (Signed)
[facility] MD Progress Note  05/10/2018 4:22 PM Gloria Perez  MRN:  [id number]  Subjective:    Ms. Wickersham was a little more animated yesterday but today, she is again in bed. She does not open her eyes and stopps answering my questions at the end. She "does not feel good" but is unable to be more specific. Admits to hallucinations.    Principal Problem: Undifferentiated schizophrenia ([place]) Diagnosis:   Patient Active Problem List   Diagnosis Date Noted  . Undifferentiated schizophrenia ([place]) [F20.3] 04/05/2018    Priority: High  . Tobacco use disorder [F17.200] 05/07/2018   Total Time spent with patient: 15 minutes  Past Psychiatric History: schizophrenia  Past Medical History:  Past Medical History:  Diagnosis Date  . Abnormal thyroid blood test   . Acute left-sided low back pain without sciatica   . Asthma   . B12 deficiency   . CHF (congestive heart failure) ([place])   . Chronic pain   . Contusion of lower back   . COPD (chronic obstructive pulmonary disease) ([place])   . Falling episodes   . GERD (gastroesophageal reflux disease)   . Hepatitis   . Hypertension   . Nonischemic cardiomyopathy ([place])   . Physical debility   . Pre-diabetes   . Schizophrenia ([place])   . Severe obesity (BMI 35.0-35.9 with comorbidity) ([place])   . Vitamin D deficiency   . Weight loss     Past Surgical History:  Procedure Laterality Date  . CESAREAN SECTION    . COLONOSCOPY    . COLONOSCOPY WITH PROPOFOL N/A 02/22/2018   Procedure: COLONOSCOPY WITH PROPOFOL;  Surgeon: Toledo, Benay Pike, MD;  Location: [facility] ENDOSCOPY;  Service: Gastroenterology;  Laterality: N/A;  . Right arm surgery    . TUBAL LIGATION     Family History:  Family History  Problem Relation Age of Onset  . Diabetes Mother    Family Psychiatric  History: none Social History:  Social History   Substance and Sexual Activity  Alcohol Use Not Currently     Social History   Substance and Sexual Activity  Drug Use Not Currently     Social History   Socioeconomic History  . Marital status: Widowed    Spouse name: Not on file  . Number of children: Not on file  . Years of education: Not on file  . Highest education level: Not on file  Occupational History  . Not on file  Social Needs  . Financial resource strain: Not on file  . Food insecurity:    Worry: Not on file    Inability: Not on file  . Transportation needs:    Medical: Not on file    Non-medical: Not on file  Tobacco Use  . Smoking status: Current Every Day Smoker    Packs/day: 0.50    Years: 35.00    Pack years: 17.50    Types: Cigarettes  . Smokeless tobacco: Never Used  Substance and Sexual Activity  . Alcohol use: Not Currently  . Drug use: Not Currently  . Sexual activity: Not on file  Lifestyle  . Physical activity:    Days per week: Not on file    Minutes per session: Not on file  . Stress: Not on file  Relationships  . Social connections:    Talks on phone: Not on file    Gets together: Not on file    Attends religious service: Not on file    Active member of club or organization:  Not on file    Attends meetings of clubs or organizations: Not on file    Relationship status: Not on file  Other Topics Concern  . Not on file  Social History Narrative  . Not on file   Additional Social History:    Pain Medications: See PTA Prescriptions: See PTA Over the Counter: See PTA History of alcohol / drug use?: No history of alcohol / drug abuse Longest period of sobriety (when/how long): Unknown Negative Consequences of Use: (na) Withdrawal Symptoms: Other (Comment)                    Sleep: Fair  Appetite:  Fair  Current Medications: Current Facility-Administered Medications  Medication Dose Route Frequency Provider Last Rate Last Dose  . acetaminophen (TYLENOL) tablet 650 mg  650 mg Oral Q6H PRN Clapacs, Madie Reno, MD   650 mg at 05/06/18 6222  . alum & mag hydroxide-simeth (MAALOX/MYLANTA) 200-200-20 MG/5ML  suspension 30 mL  30 mL Oral Q4H PRN Clapacs, John T, MD      . amantadine (SYMMETREL) capsule 100 mg  100 mg Oral BID Takhia Spoon B, MD   100 mg at 05/10/18 0811  . benztropine (COGENTIN) tablet 2 mg  2 mg Oral Daily Isiaha Greenup B, MD   2 mg at 05/10/18 0811  . buPROPion (WELLBUTRIN XL) 24 hr tablet 150 mg  150 mg Oral Daily Warda Mcqueary B, MD   150 mg at 05/10/18 0811  . cholecalciferol (VITAMIN D) tablet 1,000 Units  1,000 Units Oral Daily Clapacs, Madie Reno, MD   1,000 Units at 05/10/18 [phone]  . DULoxetine (CYMBALTA) DR capsule 60 mg  60 mg Oral Daily Chauncey Mann, MD   60 mg at 05/10/18 0811  . fluPHENAZine decanoate (PROLIXIN) injection 50 mg  50 mg Intramuscular Q14 Days Varun Jourdan B, MD   50 mg at 05/08/18 1813  . hydrOXYzine (ATARAX/VISTARIL) tablet 25 mg  25 mg Oral QHS Clapacs, Madie Reno, MD   25 mg at 05/09/18 2126  . magnesium hydroxide (MILK OF MAGNESIA) suspension 30 mL  30 mL Oral Daily PRN Clapacs, John T, MD      . tiotropium (SPIRIVA) inhalation capsule 18 mcg  18 mcg Inhalation Daily Clapacs, Madie Reno, MD   18 mcg at 05/10/18 [phone]  . traZODone (DESYREL) tablet 100 mg  100 mg Oral QHS PRN Clapacs, Madie Reno, MD   100 mg at 05/09/18 2126    Lab Results: No results found for this or any previous visit (from the past 48 hour(s)).  Blood Alcohol level:  Lab Results  Component Value Date   ETH <10 05/02/2018   ETH <10 94/17/4081    Metabolic Disorder Labs: Lab Results  Component Value Date   HGBA1C 4.8 05/04/2018   MPG 91.06 05/04/2018   MPG 88.19 05/02/2018   No results found for: PROLACTIN Lab Results  Component Value Date   CHOL 189 05/04/2018   TRIG 136 05/04/2018   HDL 79 05/04/2018   CHOLHDL 2.4 05/04/2018   VLDL 27 05/04/2018   LDLCALC 83 05/04/2018   LDLCALC 58 08/17/2017    Physical Findings: AIMS: Facial and Oral Movements Muscles of Facial Expression: None, normal Lips and Perioral Area: None, normal Jaw: None,  normal Tongue: None, normal,Extremity Movements Upper (arms, wrists, hands, fingers): None, normal Lower (legs, knees, ankles, toes): None, normal, Trunk Movements Neck, shoulders, hips: None, normal, Overall Severity Severity of abnormal movements (highest score from questions above): None,  normal Incapacitation due to abnormal movements: None, normal Patient's awareness of abnormal movements (rate only patient's report): No Awareness, Dental Status Current problems with teeth and/or dentures?: Yes Does patient usually wear dentures?: Yes(unable to assess)  CIWA:    COWS:     Musculoskeletal: Strength & Muscle Tone: within normal limits Gait & Station: normal Patient leans: N/A  Psychiatric Specialty Exam: Physical Exam  Nursing note and vitals reviewed. Psychiatric: Her affect is blunt. Her speech is delayed. She is slowed, withdrawn and actively hallucinating. Thought content is delusional. Cognition and memory are impaired. She expresses impulsivity. She is noncommunicative.    Review of Systems  Neurological: Negative.   Psychiatric/Behavioral: Positive for hallucinations.  All other systems reviewed and are negative.   Blood pressure 113/75, pulse 93, temperature 97.7 F (36.5 C), temperature source Oral, resp. rate 18, height 5' (1.524 m), weight 66.7 kg (147 lb), SpO2 99 %.Body mass index is 28.71 kg/m.  General Appearance: Disheveled  Eye Contact:  None  Speech:  Blocked  Volume:  Decreased  Mood:  Depressed  Affect:  Blunt  Thought Process:  Irrelevant and Descriptions of Associations: Tangential  Orientation:  Full (Time, Place, and Person)  Thought Content:  Hallucinations: Auditory  Suicidal Thoughts:  No  Homicidal Thoughts:  No  Memory:  Immediate;   Poor Recent;   Poor Remote;   Poor  Judgement:  Poor  Insight:  Lacking  Psychomotor Activity:  Psychomotor Retardation  Concentration:  Concentration: Poor and Attention Span: Poor  Recall:  Poor  Fund of  Knowledge:  Poor  Language:  Poor  Akathisia:  No  Handed:  Right  AIMS (if indicated):     Assets:  Communication Skills Desire for Improvement Financial Resources/Insurance Housing Physical Health Resilience Social Support  ADL's:  Intact  Cognition:  WNL  Sleep:  Number of Hours: 6.15     Treatment Plan Summary: Daily contact with patient to assess and evaluate symptoms and progress in treatment and Medication management   Ms. Flitton is a 57 year old female with a history of schizophrenia admitted for psychotic break. She also has UTI.   #Mood/psychosis -discontinue oral Prolixin -increase Cogentin to 2 mg in AM -start Symmetrel 100 mg BID -continue Prolixin dec injections, received 50 mg on 7/1 -continie Wellbutrin 150 mg daily -continue Cymbalta 60 mg daily  #Asthma -continue Spiriva  #UTI -Cx show gram positive coagulase negative staphylococci -discontinue Keflex 500 mg TID  #Labs -lipid panel and A1C are normal -EKG reviewed, Qtc 442  #Disposition -discharge to her group home -follow up with ACT team    Orson Slick, MD 05/10/2018, 4:22 PM

## 2018-05-10 NOTE — Plan of Care (Signed)
Pt calm and cooperative. Pt denies SI/HI. Pt medication compliant. Pt in room isolative for most of the evening. Pt is receptive to treatment and safety maintained on unit. Will continue to monitor.  Problem: Education: Goal: Ability to make informed decisions regarding treatment will improve Outcome: Progressing   Problem: Coping: Goal: Coping ability will improve Outcome: Progressing   Problem: Medication: Goal: Compliance with prescribed medication regimen will improve Outcome: Progressing   Problem: Self-Concept: Goal: Will verbalize positive feelings about self Outcome: Progressing   Problem: Education: Goal: Utilization of techniques to improve thought processes will improve Outcome: Progressing   Problem: Coping: Goal: Coping ability will improve Outcome: Progressing Goal: Will verbalize feelings Outcome: Progressing   Problem: Safety: Goal: Ability to disclose and discuss suicidal ideas will improve Outcome: Progressing Goal: Ability to identify and utilize support systems that promote safety will improve Outcome: Progressing

## 2018-05-10 NOTE — Progress Notes (Signed)
Recreation Therapy Notes Date: 05/10/2018  Time: 9:30 am   Location: Craft room  Behavioral response: N/A   Intervention Topic: Communication  Discussion/Intervention: Patient did not attend group.   Clinical Observations/Feedback:  Patient did not attend group.   Farris Blash LRT/CTRS         Larry Knipp 05/10/2018 11:29 AM

## 2018-05-10 NOTE — Plan of Care (Signed)
Denies SI/HI/AVH. Support and encouragement offered.  Safety maintained.  Problem: Education: Goal: Knowledge of General Education information will improve Outcome: Progressing   Problem: Health Behavior/Discharge Planning: Goal: Ability to manage health-related needs will improve Outcome: Progressing   Problem: Activity: Goal: Risk for activity intolerance will decrease Outcome: Progressing   Problem: Nutrition: Goal: Adequate nutrition will be maintained Outcome: Progressing   Problem: Coping: Goal: Level of anxiety will decrease Outcome: Progressing   Problem: Elimination: Goal: Will not experience complications related to bowel motility Outcome: Progressing Goal: Will not experience complications related to urinary retention Outcome: Progressing   Problem: Safety: Goal: Ability to remain free from injury will improve Outcome: Progressing   Problem: Skin Integrity: Goal: Risk for impaired skin integrity will decrease Outcome: Progressing   Problem: Education: Goal: Ability to make informed decisions regarding treatment will improve Outcome: Progressing   Problem: Coping: Goal: Coping ability will improve Outcome: Progressing   Problem: Health Behavior/Discharge Planning: Goal: Identification of resources available to assist in meeting health care needs will improve Outcome: Progressing   Problem: Medication: Goal: Compliance with prescribed medication regimen will improve Outcome: Progressing   Problem: Self-Concept: Goal: Ability to disclose and discuss suicidal ideas will improve Outcome: Progressing Goal: Will verbalize positive feelings about self Outcome: Progressing   Problem: Education: Goal: Utilization of techniques to improve thought processes will improve Outcome: Progressing Goal: Knowledge of the prescribed therapeutic regimen will improve Outcome: Progressing   Problem: Activity: Goal: Interest or engagement in leisure activities will  improve Outcome: Progressing Goal: Imbalance in normal sleep/wake cycle will improve Outcome: Progressing   Problem: Coping: Goal: Coping ability will improve Outcome: Progressing Goal: Will verbalize feelings Outcome: Progressing   Problem: Health Behavior/Discharge Planning: Goal: Ability to make decisions will improve Outcome: Progressing Goal: Compliance with therapeutic regimen will improve Outcome: Progressing   Problem: Role Relationship: Goal: Will demonstrate positive changes in social behaviors and relationships Outcome: Progressing   Problem: Safety: Goal: Ability to disclose and discuss suicidal ideas will improve Outcome: Progressing Goal: Ability to identify and utilize support systems that promote safety will improve Outcome: Progressing   Problem: Self-Concept: Goal: Will verbalize positive feelings about self Outcome: Progressing Goal: Level of anxiety will decrease Outcome: Progressing

## 2018-05-11 ENCOUNTER — Inpatient Hospital Stay: Payer: Medicare (Managed Care) | Attending: Psychiatry

## 2018-05-11 NOTE — Progress Notes (Signed)
D- Patient alert and oriented. Patient presents in a pleasant mood on assessment stating that she slept ok last night. Patient had some complaints of dizziness, but stated to this writer that she feels ok to go to breakfast with the assistance of her front wheel walker. Patient denies SI, HI, AVH, at this time. Patient also denies any signs/symptoms of depression and anxiety. Patient has no stated goal for today.  A- Scheduled medications administered to patient, per MD orders. Support and encouragement provided.  Routine safety checks conducted every 15 minutes.  Patient informed to notify staff with problems or concerns.  R- No adverse drug reactions noted. Patient contracts for safety at this time. Patient compliant with medications and treatment plan. Patient receptive, calm, and cooperative. Patient interacts well with others on the unit.  Patient remains safe at this time.

## 2018-05-11 NOTE — Plan of Care (Signed)
Patient verbalizes understanding of the general information that's been provided to her and has not voiced any further questions/concerns at this time. Patient has the ability to manage health-related needs and has worked to maintain her diagnostic/clinical levels within normal limits. Patient has remained free from infection as well as any health-related complications. Patient's risk for impaired skin integrity has decreased and her overall comfort level has improved. Patient's risk for activity intolerance has decreased and she has maintained adequate nutrition. Patient denies any signs/symptoms of depression/anxiety stating to this writer "I'm ok". Patient has remained free from infection thus far on the unit. Patient has the ability to identify the available resources available to assist her in meeting her health-related needs. Patient verbalizes understanding of and has been in compliance with her prescribed therapeutic regimen and has also denied SI/HI/AVH at this time. Patient has been present in the milieu for meals and snack times. Patient has no stated goals for today. Patient has demonstrated positive changes in social behaviors and has the ability to utilize support systems that promote safety. Patient remains safe on the unit at this time.  Problem: Education: Goal: Knowledge of General Education information will improve Outcome: Progressing   Problem: Health Behavior/Discharge Planning: Goal: Ability to manage health-related needs will improve Outcome: Progressing   Problem: Activity: Goal: Risk for activity intolerance will decrease Outcome: Progressing   Problem: Nutrition: Goal: Adequate nutrition will be maintained Outcome: Progressing   Problem: Coping: Goal: Level of anxiety will decrease Outcome: Progressing   Problem: Elimination: Goal: Will not experience complications related to bowel motility Outcome: Progressing Goal: Will not experience complications related to  urinary retention Outcome: Progressing   Problem: Safety: Goal: Ability to remain free from injury will improve Outcome: Progressing   Problem: Skin Integrity: Goal: Risk for impaired skin integrity will decrease Outcome: Progressing   Problem: Education: Goal: Ability to make informed decisions regarding treatment will improve Outcome: Progressing   Problem: Coping: Goal: Coping ability will improve Outcome: Progressing   Problem: Health Behavior/Discharge Planning: Goal: Identification of resources available to assist in meeting health care needs will improve Outcome: Progressing   Problem: Medication: Goal: Compliance with prescribed medication regimen will improve Outcome: Progressing   Problem: Self-Concept: Goal: Ability to disclose and discuss suicidal ideas will improve Outcome: Progressing Goal: Will verbalize positive feelings about self Outcome: Progressing   Problem: Education: Goal: Utilization of techniques to improve thought processes will improve Outcome: Progressing Goal: Knowledge of the prescribed therapeutic regimen will improve Outcome: Progressing   Problem: Activity: Goal: Interest or engagement in leisure activities will improve Outcome: Progressing Goal: Imbalance in normal sleep/wake cycle will improve Outcome: Progressing   Problem: Coping: Goal: Coping ability will improve Outcome: Progressing Goal: Will verbalize feelings Outcome: Progressing   Problem: Health Behavior/Discharge Planning: Goal: Ability to make decisions will improve Outcome: Progressing Goal: Compliance with therapeutic regimen will improve Outcome: Progressing   Problem: Role Relationship: Goal: Will demonstrate positive changes in social behaviors and relationships Outcome: Progressing   Problem: Safety: Goal: Ability to disclose and discuss suicidal ideas will improve Outcome: Progressing Goal: Ability to identify and utilize support systems that promote  safety will improve Outcome: Progressing   Problem: Self-Concept: Goal: Will verbalize positive feelings about self Outcome: Progressing Goal: Level of anxiety will decrease Outcome: Progressing

## 2018-05-11 NOTE — Progress Notes (Signed)
Patient experienced a fall this morning while walking in the hallway outside of the nurse's station. This Probation officer was still in report when a MHT notified staff that patient had fallen. Patient's condition was reported from previous shift to be fine during shift change and before fall. When this writer was talking to patient about how she was feeling and what may have caused her fall, patient stated " I lose my balance sometimes, and I can catch it, but this time, I couldn't catch it". This Probation officer paged the MD on call, Clapacs, but did not receive a call back. Once the MDs came in on the unit, this writer notified Pucilowska, MD. Patient was re-educated on patient safety and falls prevention as well as given a front wheel walker to assist patient in maintaining her balance and to provide extra support.

## 2018-05-11 NOTE — Plan of Care (Signed)
Pt had a good evening. Pt continues to has tremors when trying to drink fluids. Pt denies SI/HI. Pt medication compliant. Pt is receptive to treatment and safety maintained on unit. Will continue to monitor.  Problem: Education: Goal: Ability to make informed decisions regarding treatment will improve Outcome: Progressing   Problem: Coping: Goal: Coping ability will improve Outcome: Progressing   Problem: Health Behavior/Discharge Planning: Goal: Identification of resources available to assist in meeting health care needs will improve Outcome: Progressing   Problem: Self-Concept: Goal: Ability to disclose and discuss suicidal ideas will improve Outcome: Progressing Goal: Will verbalize positive feelings about self Outcome: Progressing   Problem: Education: Goal: Utilization of techniques to improve thought processes will improve Outcome: Progressing Goal: Knowledge of the prescribed therapeutic regimen will improve Outcome: Progressing   Problem: Coping: Goal: Coping ability will improve Outcome: Progressing Goal: Will verbalize feelings Outcome: Progressing   Problem: Safety: Goal: Ability to disclose and discuss suicidal ideas will improve Outcome: Progressing Goal: Ability to identify and utilize support systems that promote safety will improve Outcome: Progressing   Problem: Self-Concept: Goal: Will verbalize positive feelings about self Outcome: Progressing Goal: Level of anxiety will decrease Outcome: Progressing

## 2018-05-12 NOTE — Progress Notes (Addendum)
Patient ID: Gloria Perez, female   DOB: 1960/11/22, 57 y.o.   MRN: [id number] CSW spoke with Carola Frost with [facility] and informed her that pt would be discharging from the hospital on Monday, May 15, 2018.  Carola Frost informed CSW that someone from the [facility] would be coming around 1PM to pick pt up at discharge.  CSW contacted Hughie Closs, TL with [facility] in [place], [place] to let her know that pt would be discharging on Monday and that she would need an apt scheduled to meet with the doctor for follow-up.  CSW got callback and informed that pt will see the doctor at the [facility] office in [place], [place] on May 18, 2018.

## 2018-05-12 NOTE — Progress Notes (Signed)
Received Gloria Perez this AM after breakfast with her walker, she was medicated per order. She denied all of the psychiatric symptoms. She was visible in the milieu at intervals throughout the day. She got very anxious related to making a phone call prior to the telephone hours. She eventually calmed down and was given coloring materials. She arrived back at the nurses station and was tearful because she couldn't remember a phone number.

## 2018-05-12 NOTE — Progress Notes (Signed)
[facility] MD Progress Note  05/12/2018 1:14 PM Landrey Opel Lejeune  MRN:  [id number]  Subjective:    Ms. Kestner is ready for discharge. She denies any symptoms of depression, anxiety or psychosis. She tolerates medications well. She walks with a walker but gait is steady. Sleep and appetite are good. She is not shaking too bad today but makes a comment that it "comes and goes". No somatic complaints.  Principal Problem: Undifferentiated schizophrenia ([place]) Diagnosis:   Patient Active Problem List   Diagnosis Date Noted  . Undifferentiated schizophrenia ([place]) [F20.3] 04/05/2018    Priority: High  . Tobacco use disorder [F17.200] 05/07/2018   Total Time spent with patient: 20 minutes  Past Psychiatric History: schizophrenia  Past Medical History:  Past Medical History:  Diagnosis Date  . Abnormal thyroid blood test   . Acute left-sided low back pain without sciatica   . Asthma   . B12 deficiency   . CHF (congestive heart failure) ([place])   . Chronic pain   . Contusion of lower back   . COPD (chronic obstructive pulmonary disease) ([place])   . Falling episodes   . GERD (gastroesophageal reflux disease)   . Hepatitis   . Hypertension   . Nonischemic cardiomyopathy ([place])   . Physical debility   . Pre-diabetes   . Schizophrenia ([place])   . Severe obesity (BMI 35.0-35.9 with comorbidity) ([place])   . Vitamin D deficiency   . Weight loss     Past Surgical History:  Procedure Laterality Date  . CESAREAN SECTION    . COLONOSCOPY    . COLONOSCOPY WITH PROPOFOL N/A 02/22/2018   Procedure: COLONOSCOPY WITH PROPOFOL;  Surgeon: Toledo, Benay Pike, MD;  Location: [facility] ENDOSCOPY;  Service: Gastroenterology;  Laterality: N/A;  . Right arm surgery    . TUBAL LIGATION     Family History:  Family History  Problem Relation Age of Onset  . Diabetes Mother    Family Psychiatric  History: none Social History:  Social History   Substance and Sexual Activity  Alcohol Use Not Currently     Social History    Substance and Sexual Activity  Drug Use Not Currently    Social History   Socioeconomic History  . Marital status: Widowed    Spouse name: Not on file  . Number of children: Not on file  . Years of education: Not on file  . Highest education level: Not on file  Occupational History  . Not on file  Social Needs  . Financial resource strain: Not on file  . Food insecurity:    Worry: Not on file    Inability: Not on file  . Transportation needs:    Medical: Not on file    Non-medical: Not on file  Tobacco Use  . Smoking status: Current Every Day Smoker    Packs/day: 0.50    Years: 35.00    Pack years: 17.50    Types: Cigarettes  . Smokeless tobacco: Never Used  Substance and Sexual Activity  . Alcohol use: Not Currently  . Drug use: Not Currently  . Sexual activity: Not on file  Lifestyle  . Physical activity:    Days per week: Not on file    Minutes per session: Not on file  . Stress: Not on file  Relationships  . Social connections:    Talks on phone: Not on file    Gets together: Not on file    Attends religious service: Not on file  Active member of club or organization: Not on file    Attends meetings of clubs or organizations: Not on file    Relationship status: Not on file  Other Topics Concern  . Not on file  Social History Narrative  . Not on file   Additional Social History:    Pain Medications: See PTA Prescriptions: See PTA Over the Counter: See PTA History of alcohol / drug use?: No history of alcohol / drug abuse Longest period of sobriety (when/how long): Unknown Negative Consequences of Use: (na) Withdrawal Symptoms: Other (Comment)                    Sleep: Fair  Appetite:  Fair  Current Medications: Current Facility-Administered Medications  Medication Dose Route Frequency Provider Last Rate Last Dose  . acetaminophen (TYLENOL) tablet 650 mg  650 mg Oral Q6H PRN Clapacs, Madie Reno, MD   650 mg at 05/11/18 0814  . alum &  mag hydroxide-simeth (MAALOX/MYLANTA) 200-200-20 MG/5ML suspension 30 mL  30 mL Oral Q4H PRN Clapacs, John T, MD      . amantadine (SYMMETREL) capsule 100 mg  100 mg Oral BID Faatimah Spielberg B, MD   100 mg at 05/12/18 0823  . benztropine (COGENTIN) tablet 2 mg  2 mg Oral Daily Aleister Lady B, MD   2 mg at 05/12/18 0824  . buPROPion (WELLBUTRIN XL) 24 hr tablet 150 mg  150 mg Oral Daily Kaia Depaolis B, MD   150 mg at 05/12/18 0824  . cholecalciferol (VITAMIN D) tablet 1,000 Units  1,000 Units Oral Daily Clapacs, Madie Reno, MD   1,000 Units at 05/12/18 [id number]  . DULoxetine (CYMBALTA) DR capsule 60 mg  60 mg Oral Daily Chauncey Mann, MD   60 mg at 05/12/18 8466  . fluPHENAZine decanoate (PROLIXIN) injection 50 mg  50 mg Intramuscular Q14 Days Adaria Hole B, MD   50 mg at 05/08/18 1813  . hydrOXYzine (ATARAX/VISTARIL) tablet 25 mg  25 mg Oral QHS Clapacs, Madie Reno, MD   25 mg at 05/11/18 2220  . magnesium hydroxide (MILK OF MAGNESIA) suspension 30 mL  30 mL Oral Daily PRN Clapacs, John T, MD      . tiotropium (SPIRIVA) inhalation capsule 18 mcg  18 mcg Inhalation Daily Clapacs, Madie Reno, MD   18 mcg at 05/12/18 0824  . traZODone (DESYREL) tablet 100 mg  100 mg Oral QHS PRN Clapacs, Madie Reno, MD   100 mg at 05/11/18 2146    Lab Results: No results found for this or any previous visit (from the past 48 hour(s)).  Blood Alcohol level:  Lab Results  Component Value Date   ETH <10 05/02/2018   ETH <10 59/93/5701    Metabolic Disorder Labs: Lab Results  Component Value Date   HGBA1C 4.8 05/04/2018   MPG 91.06 05/04/2018   MPG 88.19 05/02/2018   No results found for: PROLACTIN Lab Results  Component Value Date   CHOL 189 05/04/2018   TRIG 136 05/04/2018   HDL 79 05/04/2018   CHOLHDL 2.4 05/04/2018   VLDL 27 05/04/2018   LDLCALC 83 05/04/2018   LDLCALC 58 08/17/2017    Physical Findings: AIMS: Facial and Oral Movements Muscles of Facial Expression: None, normal Lips  and Perioral Area: None, normal Jaw: None, normal Tongue: None, normal,Extremity Movements Upper (arms, wrists, hands, fingers): None, normal Lower (legs, knees, ankles, toes): None, normal, Trunk Movements Neck, shoulders, hips: None, normal, Overall Severity Severity of abnormal movements (  highest score from questions above): None, normal Incapacitation due to abnormal movements: None, normal Patient's awareness of abnormal movements (rate only patient's report): No Awareness, Dental Status Current problems with teeth and/or dentures?: Yes Does patient usually wear dentures?: Yes(unable to assess)  CIWA:    COWS:     Musculoskeletal: Strength & Muscle Tone: decreased Gait & Station: unsteady Patient leans: N/A  Psychiatric Specialty Exam: Physical Exam  Nursing note and vitals reviewed. Psychiatric: Her speech is normal. Judgment and thought content normal. Her affect is blunt. She is withdrawn. Cognition and memory are normal.    Review of Systems  Neurological: Negative.   Psychiatric/Behavioral: Positive for hallucinations.  All other systems reviewed and are negative.   Blood pressure (!) 152/118, pulse (!) 107, temperature 98.2 F (36.8 C), resp. rate 18, height 5' (1.524 m), weight 66.7 kg (147 lb), SpO2 100 %.Body mass index is 28.71 kg/m.  General Appearance: Casual  Eye Contact:  Good  Speech:  Clear and Coherent  Volume:  Normal  Mood:  Euthymic  Affect:  Flat  Thought Process:  Goal Directed and Descriptions of Associations: Intact  Orientation:  Full (Time, Place, and Person)  Thought Content:  WDL  Suicidal Thoughts:  No  Homicidal Thoughts:  No  Memory:  Immediate;   Fair Recent;   Fair Remote;   Fair  Judgement:  Poor  Insight:  Shallow  Psychomotor Activity:  Decreased  Concentration:  Concentration: Poor and Attention Span: Poor  Recall:  Poor  Fund of Knowledge:  Fair  Language:  Fair  Akathisia:  No  Handed:  Right  AIMS (if indicated):      Assets:  Communication Skills Desire for Improvement Financial Resources/Insurance Housing Physical Health Resilience Social Support  ADL's:  Intact  Cognition:  WNL  Sleep:  Number of Hours: 7.15     Treatment Plan Summary: Daily contact with patient to assess and evaluate symptoms and progress in treatment and Medication management   Ms. Mccubbins is a 57 year old female with a history of schizophrenia admitted for psychotic break. She is improving slowly.   #Mood/psychosis -continueProlixin decinjections, received50 mgon 7/1 -continue Cogentinto 2 mg in AM -continue Symmetrel 100 mg BID -continie Wellbutrin 150 mg daily -continue Cymbalta 60 mg daily  #Asthma -continue Spiriva  #UTI -completed a course of Keflex -repeat UA  #Fall -head CT scan -walker -PT evaluation  #Labs -lipid panel andA1C are normal -EKG reviewed, Qtc 442  #Disposition -discharge to her group home -follow up with ACT team    Orson Slick, MD 05/12/2018, 1:14 PM

## 2018-05-12 NOTE — NC FL2 (Signed)
[place] LEVEL OF CARE SCREENING TOOL     IDENTIFICATION  Patient Name: Gloria Perez Birthdate: 24-Feb-1961 Sex: female Admission Date (Current Location): 05/02/2018  [place] and [place] Number:   Selena Lesser   [id number] [place] and Address:   [facility]  [place], [place], [place]  [address]      Provider Number:  [id number]  Attending Physician Name and Address:  Clovis Fredrickson, MD  Relative Name and Phone Number:   Marcelo Baldy  [phone]    Current Level of Care:  Inpatient/Hospital Recommended Level of Care:  Level II/Group Home Prior Approval Number:    Date Approved/Denied:   PASRR Number:    Discharge Plan:  Level II/Group Home    Current Diagnoses: Patient Active Problem List   Diagnosis Date Noted  . Tobacco use disorder 05/07/2018  . Undifferentiated schizophrenia ([place]) 04/05/2018    Orientation RESPIRATION BLADDER Height & Weight      Self, Time, Situation, Place   Normal  Continent Weight: 147 lb (66.7 kg) Height:  5' (152.4 cm)  BEHAVIORAL SYMPTOMS/MOOD NEUROLOGICAL BOWEL NUTRITION STATUS       Continent  Regular Diet  AMBULATORY STATUS COMMUNICATION OF NEEDS Skin    Walks with walker, but gait is steady  Verbally  Normal                       Personal Care Assistance Level of Assistance   Bathing, Feeding, Dressing  All Independent         Functional Limitations Info   Sight, Hearing, Speech  All Adequate        SPECIAL CARE FACTORS FREQUENCY                       Contractures  Not present    Additional Factors Info                  Current Medications (05/12/2018):  This is the current hospital active medication list Current Facility-Administered Medications  Medication Dose Route Frequency Provider Last Rate Last Dose  . acetaminophen (TYLENOL) tablet 650 mg  650 mg Oral Q6H PRN Clapacs, Madie Reno, MD   650 mg at 05/11/18 0814  . alum & mag  hydroxide-simeth (MAALOX/MYLANTA) 200-200-20 MG/5ML suspension 30 mL  30 mL Oral Q4H PRN Clapacs, John T, MD      . amantadine (SYMMETREL) capsule 100 mg  100 mg Oral BID Pucilowska, Jolanta B, MD   100 mg at 05/12/18 0823  . benztropine (COGENTIN) tablet 2 mg  2 mg Oral Daily Pucilowska, Jolanta B, MD   2 mg at 05/12/18 0824  . buPROPion (WELLBUTRIN XL) 24 hr tablet 150 mg  150 mg Oral Daily Pucilowska, Jolanta B, MD   150 mg at 05/12/18 0824  . cholecalciferol (VITAMIN D) tablet 1,000 Units  1,000 Units Oral Daily Clapacs, Madie Reno, MD   1,000 Units at 05/12/18 [phone]  . DULoxetine (CYMBALTA) DR capsule 60 mg  60 mg Oral Daily Chauncey Mann, MD   60 mg at 05/12/18 3154  . fluPHENAZine decanoate (PROLIXIN) injection 50 mg  50 mg Intramuscular Q14 Days Pucilowska, Jolanta B, MD   50 mg at 05/08/18 1813  . hydrOXYzine (ATARAX/VISTARIL) tablet 25 mg  25 mg Oral QHS Clapacs, Madie Reno, MD   25 mg at 05/11/18 2220  . magnesium hydroxide (MILK OF MAGNESIA) suspension 30 mL  30 mL  Oral Daily PRN Clapacs, Madie Reno, MD      . tiotropium [facility]) inhalation capsule 18 mcg  18 mcg Inhalation Daily Clapacs, Madie Reno, MD   18 mcg at 05/12/18 [phone]  . traZODone (DESYREL) tablet 100 mg  100 mg Oral QHS PRN Clapacs, Madie Reno, MD   100 mg at 05/11/18 2146     Discharge Medications: Please see discharge summary for a list of discharge medications.  Relevant Imaging Results:  Relevant Lab Results:   Additional Information  SSN [id number]  Devona Konig, LCSW

## 2018-05-12 NOTE — BHH Group Notes (Signed)
LCSW Group Therapy Note  05/12/2018 1:00 pm  Type of Therapy and Topic:  Group Therapy:  Feelings around Relapse and Recovery  Participation Level:  Minimal   Description of Group:    Patients in this group will discuss emotions they experience before and after a relapse. They will process how experiencing these feelings, or avoidance of experiencing them, relates to having a relapse. Facilitator will guide patients to explore emotions they have related to recovery. Patients will be encouraged to process which emotions are more powerful. They will be guided to discuss the emotional reaction significant others in their lives may have to their relapse or recovery. Patients will be assisted in exploring ways to respond to the emotions of others without this contributing to a relapse.  Therapeutic Goals: 1. Patient will identify two or more emotions that lead to a relapse for them 2. Patient will identify two emotions that result when they relapse 3. Patient will identify two emotions related to recovery 4. Patient will demonstrate ability to communicate their needs through discussion and/or role plays   Summary of Patient Progress:  Dalanie participated some in the group discussion on feelings around relapse and recovery.  She did not identify emotions that have lead to and resulted in relapse, but did share that her faith and going to church have often led to her recovery.  Emmely was allowed to sing a part of a gospel song that she shared really helps her to get through her mental health issues.   Therapeutic Modalities:   Cognitive Behavioral Therapy Solution-Focused Therapy Assertiveness Training Relapse Prevention Therapy   Devona Konig, [place] 05/12/2018 2:03 PM

## 2018-05-12 NOTE — Progress Notes (Signed)
Pt is anxious at shift change and is tearful.  Pt unable to use phone but wants to call her care coordinator at home she is residing at.  Pt unable to state name or recall number.  Name and number obtained from file, written down and assistance with can made.  Pt loses number within the hour and begins again with anxiety and desire to use the phone with assistance.  Pt is med compliant but forgets she has already taken her meds.  Pt has significant trembling of both hands and has difficulty holding and drinking water with her meds.  Pt has front wheel walker and is encouraged to use it at all times. Pt denies pain or discomfort.  Pt verbally contracts for safety.  Pt continues to be med compliant. Pt remains safe on unit with q 15 min checks.

## 2018-05-12 NOTE — Tx Team (Signed)
Interdisciplinary Treatment and Diagnostic Plan Update  05/12/2018 Time of Session: 10:40 AM  Gloria Perez MRN: [id number]  Principal Diagnosis: Undifferentiated schizophrenia [facility])  Secondary Diagnoses: Principal Problem:   Undifferentiated schizophrenia ([place]) Active Problems:   Tobacco use disorder   Current Medications:  Current Facility-Administered Medications  Medication Dose Route Frequency Provider Last Rate Last Dose  . acetaminophen (TYLENOL) tablet 650 mg  650 mg Oral Q6H PRN Clapacs, Madie Reno, MD   650 mg at 05/11/18 0814  . alum & mag hydroxide-simeth (MAALOX/MYLANTA) 200-200-20 MG/5ML suspension 30 mL  30 mL Oral Q4H PRN Clapacs, John T, MD      . amantadine (SYMMETREL) capsule 100 mg  100 mg Oral BID Pucilowska, Jolanta B, MD   100 mg at 05/12/18 0823  . benztropine (COGENTIN) tablet 2 mg  2 mg Oral Daily Pucilowska, Jolanta B, MD   2 mg at 05/12/18 0824  . buPROPion (WELLBUTRIN XL) 24 hr tablet 150 mg  150 mg Oral Daily Pucilowska, Jolanta B, MD   150 mg at 05/12/18 0824  . cholecalciferol (VITAMIN D) tablet 1,000 Units  1,000 Units Oral Daily Clapacs, Madie Reno, MD   1,000 Units at 05/12/18 [phone]  . DULoxetine (CYMBALTA) DR capsule 60 mg  60 mg Oral Daily Chauncey Mann, MD   60 mg at 05/12/18 7124  . fluPHENAZine decanoate (PROLIXIN) injection 50 mg  50 mg Intramuscular Q14 Days Pucilowska, Jolanta B, MD   50 mg at 05/08/18 1813  . hydrOXYzine (ATARAX/VISTARIL) tablet 25 mg  25 mg Oral QHS Clapacs, Madie Reno, MD   25 mg at 05/11/18 2220  . magnesium hydroxide (MILK OF MAGNESIA) suspension 30 mL  30 mL Oral Daily PRN Clapacs, John T, MD      . tiotropium (SPIRIVA) inhalation capsule 18 mcg  18 mcg Inhalation Daily Clapacs, Madie Reno, MD   18 mcg at 05/12/18 0824  . traZODone (DESYREL) tablet 100 mg  100 mg Oral QHS PRN Clapacs, Madie Reno, MD   100 mg at 05/11/18 2146   PTA Medications: Medications Prior to Admission  Medication Sig Dispense Refill Last Dose  . benztropine  (COGENTIN) 0.5 MG tablet Take 0.5 mg by mouth 2 (two) times daily.   05/02/2018 at 0800  . buPROPion (WELLBUTRIN SR) 150 MG 12 hr tablet Take 150 mg by mouth 2 (two) times daily.   05/02/2018 at 0800  . Cholecalciferol 1000 units tablet Take 1,000 Units by mouth daily.    05/02/2018 at 0800  . DULoxetine (CYMBALTA) 30 MG capsule Take 30 mg by mouth 2 (two) times daily.    05/02/2018 at 0800  . hydrOXYzine (ATARAX/VISTARIL) 25 MG tablet Take 25 mg by mouth every evening.    05/01/2018 at 2000  . lamoTRIgine (LAMICTAL) 25 MG tablet Take 25 mg by mouth 2 (two) times daily.   05/02/2018 at 0800  . lisinopril (PRINIVIL,ZESTRIL) 5 MG tablet Take 5 mg by mouth daily.   05/02/2018 at 0800  . omeprazole (PRILOSEC) 20 MG capsule Take 20 mg by mouth daily.   05/02/2018 at 0800  . tiotropium (SPIRIVA) 18 MCG inhalation capsule Place 18 mcg into inhaler and inhale daily.   02/22/2018 at Unknown time  . traZODone (DESYREL) 50 MG tablet Take 50 mg by mouth at bedtime.   05/01/2018 at 2000    Patient Stressors: Financial difficulties Health problems  Patient Strengths: Average or above average intelligence Supportive family/friends  Treatment Modalities: Medication Management, Group therapy, Case management,  1 to  1 session with clinician, Psychoeducation, Recreational therapy.   Physician Treatment Plan for Primary Diagnosis: Undifferentiated schizophrenia ([place]) Long Term Goal(s): Improvement in symptoms so as ready for discharge Improvement in symptoms so as ready for discharge   Short Term Goals: Ability to verbalize feelings will improve Ability to disclose and discuss suicidal ideas Ability to maintain clinical measurements within normal limits will improve Compliance with prescribed medications will improve  Medication Management: Evaluate patient's response, side effects, and tolerance of medication regimen.  Therapeutic Interventions: 1 to 1 sessions, Unit Group sessions and Medication  administration.  Evaluation of Outcomes: Progressing  Physician Treatment Plan for Secondary Diagnosis: Principal Problem:   Undifferentiated schizophrenia ([place]) Active Problems:   Tobacco use disorder  Long Term Goal(s): Improvement in symptoms so as ready for discharge Improvement in symptoms so as ready for discharge   Short Term Goals: Ability to verbalize feelings will improve Ability to disclose and discuss suicidal ideas Ability to maintain clinical measurements within normal limits will improve Compliance with prescribed medications will improve     Medication Management: Evaluate patient's response, side effects, and tolerance of medication regimen.  Therapeutic Interventions: 1 to 1 sessions, Unit Group sessions and Medication administration.  Evaluation of Outcomes: Progressing   RN Treatment Plan for Primary Diagnosis: Undifferentiated schizophrenia ([place]) Long Term Goal(s): Knowledge of disease and therapeutic regimen to maintain health will improve  Short Term Goals: Ability to remain free from injury will improve, Ability to disclose and discuss suicidal ideas and Compliance with prescribed medications will improve  Medication Management: RN will administer medications as ordered by provider, will assess and evaluate patient's response and provide education to patient for prescribed medication. RN will report any adverse and/or side effects to prescribing provider.  Therapeutic Interventions: 1 on 1 counseling sessions, Psychoeducation, Medication administration, Evaluate responses to treatment, Monitor vital signs and CBGs as ordered, Perform/monitor CIWA, COWS, AIMS and Fall Risk screenings as ordered, Perform wound care treatments as ordered.  Evaluation of Outcomes: Progressing   LCSW Treatment Plan for Primary Diagnosis: Undifferentiated schizophrenia ([place]) Long Term Goal(s): Safe transition to appropriate next level of care at discharge, Engage patient in  therapeutic group addressing interpersonal concerns.  Short Term Goals: Engage patient in aftercare planning with referrals and resources and Increase skills for wellness and recovery  Therapeutic Interventions: Assess for all discharge needs, 1 to 1 time with Social worker, Explore available resources and support systems, Assess for adequacy in community support network, Educate family and significant other(s) on suicide prevention, Complete Psychosocial Assessment, Interpersonal group therapy.  Evaluation of Outcomes: Progressing   Progress in Treatment: Attending groups: Yes. Participating in groups: Yes. Taking medication as prescribed: Yes. Toleration medication: Yes. and As evidenced by:  n/a Family/Significant other contact made: Yes, individual(s) contacted:  CSW contacted a member of pt's ACTT, Darryl and [place] owner, Marcelo Baldy. Patient understands diagnosis: Yes. Discussing patient identified problems/goals with staff: Yes. Medical problems stabilized or resolved: Yes. and As evidenced by:  n/a Denies suicidal/homicidal ideation: Yes. Issues/concerns per patient self-inventory: No. Other: n/a  New problem(s) identified: No, Describe:  No new issues identified  New Short Term/Long Term Goal(s):  Patient Goals:  "Try to go to groups and behave myself"  Discharge Plan or Barriers: Discharge plan remains for pt to return to group home and resume services with her ACT Team.  Reason for Continuation of Hospitalization: Medical Issues Medication stabilization  Estimated Length of Stay: 3-5 days  Recreational Therapy: Patient Stressors: N/A Patient Goal: Patient  will engage in groups without prompting or encouragement from LRT x3 group sessions within 5 recreation therapy group sessions  Attendees: Patient:  05/12/2018 3:00 PM  Physician: Orson Slick, MD 05/12/2018 3:00 PM  Nursing:  05/12/2018 3:00 PM  RN Care Manager: 05/12/2018 3:00 PM  Social Worker: Derrek Gu,  LCSW 05/12/2018 3:00 PM  Recreational Therapist:  05/12/2018 3:00 PM  Other: Alden Hipp, LCSW 05/12/2018 3:00 PM  Other: Marney Doctor, Chaplain 05/12/2018 3:00 PM  Other: 05/12/2018 3:00 PM    Scribe for Treatment Team: Devona Konig, LCSW 05/12/2018 3:00 PM

## 2018-05-12 NOTE — Evaluation (Signed)
Physical Therapy Evaluation Patient Details Name: Gloria Perez MRN: [id number] DOB: [birth year] Today's Date: 05/12/2018   History of Present Illness  Pt admitted for schizophrenia. History includes COPD, GERD, HTN, and schizophrenia. Pt had fall on 7/4 however no injuries per chart review. Upon arrival, pt up ambulating in hallway with RW and trying to open door.  Clinical Impression  Pt is a pleasant 57 year old female who was admitted for schizoprenia. Of note, pt with fall on 7/4, no injuries. Pt performs bed mobility/transfers with independence and ambulation with supervision and RW. Reports nonspecific pain and that she "doesn't feel good". Agreeable to ambulate to group session at end of evaluation. Pt demonstrates all bed mobility/transfers/ambulation at baseline level. Pt does not require any further PT needs at this time. Pt will be dc in house and does not require follow up. RN aware. Will dc current orders.      Follow Up Recommendations No PT follow up    Equipment Recommendations  None recommended by PT    Recommendations for Other Services       Precautions / Restrictions Precautions Precautions: Fall Restrictions Weight Bearing Restrictions: No      Mobility  Bed Mobility Overal bed mobility: Independent             General bed mobility comments: safe technique with ability to come to EOB. No dizziness or weakness noted while sitting at EOB.  Transfers Overall transfer level: Independent               General transfer comment: able to stand without AD initially, then gave AD for improved balance. Upright posture noted. Adjusted height of RW  Ambulation/Gait Ambulation/Gait assistance: Supervision Gait Distance (Feet): 60 Feet Assistive device: Rolling walker (2 wheeled) Gait Pattern/deviations: Step-through pattern     General Gait Details: observed pt ambulating down hallway prior to evaluation with mod I and using RW. NO LOB noted. Then  during evaluation, ambulated additional 60' to group session using RW. Able to complete turns without LOB. Cued to keep closer to RW.  Stairs            Wheelchair Mobility    Modified Rankin (Stroke Patients Only)       Balance Overall balance assessment: History of Falls;Needs assistance Sitting-balance support: Feet supported Sitting balance-Leahy Scale: Normal     Standing balance support: Bilateral upper extremity supported Standing balance-Leahy Scale: Good                               Pertinent Vitals/Pain Pain Assessment: (reports she generally doesn't feel well, nonspecific)    Home Living Family/patient expects to be discharged to:: Group home Living Arrangements: Other (Comment)                    Prior Function Level of Independence: Independent with assistive device(s)         Comments: Per patient, she typically uses rollater also has SPC     Hand Dominance        Extremity/Trunk Assessment   Upper Extremity Assessment Upper Extremity Assessment: Overall WFL for tasks assessed(noted intentional tremors in B UE)    Lower Extremity Assessment Lower Extremity Assessment: Overall WFL for tasks assessed       Communication   Communication: No difficulties  Cognition Arousal/Alertness: Awake/alert Behavior During Therapy: Anxious Overall Cognitive Status: Within Functional Limits for tasks assessed  General Comments      Exercises     Assessment/Plan    PT Assessment Patent does not need any further PT services  PT Problem List         PT Treatment Interventions      PT Goals (Current goals can be found in the Care Plan section)  Acute Rehab PT Goals Patient Stated Goal: to go back to group home PT Goal Formulation: All assessment and education complete, DC therapy Time For Goal Achievement: 05/12/18 Potential to Achieve Goals: Good    Frequency      Barriers to discharge        Co-evaluation               AM-PAC PT "6 Clicks" Daily Activity  Outcome Measure Difficulty turning over in bed (including adjusting bedclothes, sheets and blankets)?: None Difficulty moving from lying on back to sitting on the side of the bed? : None Difficulty sitting down on and standing up from a chair with arms (e.g., wheelchair, bedside commode, etc,.)?: None Help needed moving to and from a bed to chair (including a wheelchair)?: None Help needed walking in hospital room?: None Help needed climbing 3-5 steps with a railing? : None 6 Click Score: 24    End of Session Equipment Utilized During Treatment: Gait belt Activity Tolerance: Patient tolerated treatment well Patient left: in chair(in group session with CSW) Nurse Communication: Mobility status PT Visit Diagnosis: Unsteadiness on feet (R26.81);Difficulty in walking, not elsewhere classified (R26.2)    Time: 9390-3009 PT Time Calculation (min) (ACUTE ONLY): 10 min   Charges:   PT Evaluation $PT Eval Low Complexity: 1 Low     PT G CodesGreggory Stallion, PT, DPT [phone]   Aariv Medlock 05/12/2018, 2:22 PM

## 2018-05-13 MED ORDER — HYDROXYZINE HCL 50 MG PO TABS
50.0000 mg | ORAL_TABLET | Freq: Four times a day (QID) | ORAL | Status: DC | PRN
  Administered 2018-05-13 – 2018-05-14 (×2): 50 mg via ORAL
  Filled 2018-05-13: qty 1

## 2018-05-13 NOTE — Progress Notes (Signed)
[facility] MD Progress Note  05/13/2018 3:12 PM Gloria Perez  MRN:  [id number]  Subjective:   "Im weak"  Pt walking with walker, reports feeling weak and difficulty walking, but walking with walker , pt reportedly somatic, taking meds tolerating well. PT has  already evaluated her.    Principal Problem: Undifferentiated schizophrenia ([place]) Diagnosis:   Patient Active Problem List   Diagnosis Date Noted  . Tobacco use disorder [F17.200] 05/07/2018  . Undifferentiated schizophrenia ([place]) [F20.3] 04/05/2018   Total Time spent with patient: 25 min  Past Psychiatric History: schizophrenia  Past Medical History:  Past Medical History:  Diagnosis Date  . Abnormal thyroid blood test   . Acute left-sided low back pain without sciatica   . Asthma   . B12 deficiency   . CHF (congestive heart failure) ([place])   . Chronic pain   . Contusion of lower back   . COPD (chronic obstructive pulmonary disease) ([place])   . Falling episodes   . GERD (gastroesophageal reflux disease)   . Hepatitis   . Hypertension   . Nonischemic cardiomyopathy ([place])   . Physical debility   . Pre-diabetes   . Schizophrenia ([place])   . Severe obesity (BMI 35.0-35.9 with comorbidity) ([place])   . Vitamin D deficiency   . Weight loss     Past Surgical History:  Procedure Laterality Date  . CESAREAN SECTION    . COLONOSCOPY    . COLONOSCOPY WITH PROPOFOL N/A 02/22/2018   Procedure: COLONOSCOPY WITH PROPOFOL;  Surgeon: Toledo, Benay Pike, MD;  Location: [facility] ENDOSCOPY;  Service: Gastroenterology;  Laterality: N/A;  . Right arm surgery    . TUBAL LIGATION     Family History:  Family History  Problem Relation Age of Onset  . Diabetes Mother    Family Psychiatric  History: none Social History:  Social History   Substance and Sexual Activity  Alcohol Use Not Currently     Social History   Substance and Sexual Activity  Drug Use Not Currently    Social History   Socioeconomic History  . Marital status: Widowed     Spouse name: Not on file  . Number of children: Not on file  . Years of education: Not on file  . Highest education level: Not on file  Occupational History  . Not on file  Social Needs  . Financial resource strain: Not on file  . Food insecurity:    Worry: Not on file    Inability: Not on file  . Transportation needs:    Medical: Not on file    Non-medical: Not on file  Tobacco Use  . Smoking status: Current Every Day Smoker    Packs/day: 0.50    Years: 35.00    Pack years: 17.50    Types: Cigarettes  . Smokeless tobacco: Never Used  Substance and Sexual Activity  . Alcohol use: Not Currently  . Drug use: Not Currently  . Sexual activity: Not on file  Lifestyle  . Physical activity:    Days per week: Not on file    Minutes per session: Not on file  . Stress: Not on file  Relationships  . Social connections:    Talks on phone: Not on file    Gets together: Not on file    Attends religious service: Not on file    Active member of club or organization: Not on file    Attends meetings of clubs or organizations: Not on file    Relationship  status: Not on file  Other Topics Concern  . Not on file  Social History Narrative  . Not on file   Additional Social History:    Pain Medications: See PTA Prescriptions: See PTA Over the Counter: See PTA History of alcohol / drug use?: No history of alcohol / drug abuse Longest period of sobriety (when/how long): Unknown Negative Consequences of Use: (na) Withdrawal Symptoms: Other (Comment)                    Sleep: Fair  Appetite:  Fair  Current Medications: Current Facility-Administered Medications  Medication Dose Route Frequency Provider Last Rate Last Dose  . acetaminophen (TYLENOL) tablet 650 mg  650 mg Oral Q6H PRN Clapacs, Madie Reno, MD   650 mg at 05/11/18 0814  . alum & mag hydroxide-simeth (MAALOX/MYLANTA) 200-200-20 MG/5ML suspension 30 mL  30 mL Oral Q4H PRN Clapacs, John T, MD      . amantadine  (SYMMETREL) capsule 100 mg  100 mg Oral BID Pucilowska, Jolanta B, MD   100 mg at 05/13/18 0929  . benztropine (COGENTIN) tablet 2 mg  2 mg Oral Daily Pucilowska, Jolanta B, MD   2 mg at 05/13/18 0928  . buPROPion (WELLBUTRIN XL) 24 hr tablet 150 mg  150 mg Oral Daily Pucilowska, Jolanta B, MD   150 mg at 05/13/18 0928  . cholecalciferol (VITAMIN D) tablet 1,000 Units  1,000 Units Oral Daily Clapacs, Madie Reno, MD   1,000 Units at 05/13/18 [phone]  . DULoxetine (CYMBALTA) DR capsule 60 mg  60 mg Oral Daily Chauncey Mann, MD   60 mg at 05/13/18 0929  . fluPHENAZine decanoate (PROLIXIN) injection 50 mg  50 mg Intramuscular Q14 Days Pucilowska, Jolanta B, MD   50 mg at 05/08/18 1813  . hydrOXYzine (ATARAX/VISTARIL) tablet 25 mg  25 mg Oral QHS Clapacs, Madie Reno, MD   25 mg at 05/12/18 2157  . magnesium hydroxide (MILK OF MAGNESIA) suspension 30 mL  30 mL Oral Daily PRN Clapacs, John T, MD      . tiotropium (SPIRIVA) inhalation capsule 18 mcg  18 mcg Inhalation Daily Clapacs, Madie Reno, MD   18 mcg at 05/13/18 0929  . traZODone (DESYREL) tablet 100 mg  100 mg Oral QHS PRN Clapacs, Madie Reno, MD   100 mg at 05/12/18 2157    Lab Results: No results found for this or any previous visit (from the past 48 hour(s)).  Blood Alcohol level:  Lab Results  Component Value Date   ETH <10 05/02/2018   ETH <10 93/81/0175    Metabolic Disorder Labs: Lab Results  Component Value Date   HGBA1C 4.8 05/04/2018   MPG 91.06 05/04/2018   MPG 88.19 05/02/2018   No results found for: PROLACTIN Lab Results  Component Value Date   CHOL 189 05/04/2018   TRIG 136 05/04/2018   HDL 79 05/04/2018   CHOLHDL 2.4 05/04/2018   VLDL 27 05/04/2018   LDLCALC 83 05/04/2018   LDLCALC 58 08/17/2017    Physical Findings: AIMS: Facial and Oral Movements Muscles of Facial Expression: None, normal Lips and Perioral Area: None, normal Jaw: None, normal Tongue: None, normal,Extremity Movements Upper (arms, wrists, hands, fingers):  None, normal Lower (legs, knees, ankles, toes): None, normal, Trunk Movements Neck, shoulders, hips: None, normal, Overall Severity Severity of abnormal movements (highest score from questions above): None, normal Incapacitation due to abnormal movements: None, normal Patient's awareness of abnormal movements (rate only patient's report): No Awareness,  Dental Status Current problems with teeth and/or dentures?: Yes Does patient usually wear dentures?: Yes(unable to assess)  CIWA:    COWS:     Musculoskeletal: Strength & Muscle Tone: decreased Gait & Station: unsteady Patient leans: N/A  Psychiatric Specialty Exam: Physical Exam  Nursing note and vitals reviewed. Psychiatric: Her speech is normal. Judgment and thought content normal. Her affect is blunt. She is withdrawn. Cognition and memory are normal.    Review of Systems  Neurological: Negative.   Psychiatric/Behavioral: Positive for hallucinations.  All other systems reviewed and are negative.   Blood pressure 109/82, pulse 92, temperature 98.2 F (36.8 C), temperature source Oral, resp. rate 20, height 5' (1.524 m), weight 66.7 kg (147 lb), SpO2 100 %.Body mass index is 28.71 kg/m.  General Appearance: Casual, walking with walker  Eye Contact:  Good  Speech:  Clear and Coherent  Volume:  Normal  Mood:  Euthymic  Affect:  Flat  Thought Process:  Goal Directed and Descriptions of Associations: Intact  Orientation:  Full (Time, Place, and Person)  Thought Content:  WDL  Suicidal Thoughts:  No, denies  Homicidal Thoughts:  No  Memory:  Immediate;   Fair Recent;   Fair Remote;   Fair  Judgement:  Poor  Insight:  Shallow  Psychomotor Activity:  Decreased  Concentration:  Concentration: Poor and Attention Span: Poor  Recall:  Poor  Fund of Knowledge:  Fair  Language:  Fair  Akathisia:  No  Handed:  Right  AIMS (if indicated):     Assets:  Communication Skills Desire for Improvement Financial  Resources/Insurance Housing Physical Health Resilience Social Support  ADL's:  Intact  Cognition:  WNL  Sleep:  Number of Hours: 7     Treatment Plan Summary: Daily contact with patient to assess and evaluate symptoms and progress in treatment and Medication management   Ms. Casebolt is a 57 year old female with a history of schizophrenia admitted for psychotic break. She is improving slowly, somatic.   #Mood/psychosis -continueProlixin decinjections, received50 mgon 7/1 -continue Cogentinto 2 mg in AM -continue Symmetrel 100 mg BID -continie Wellbutrin 150 mg daily -continue Cymbalta 60 mg daily  #Fall -head CT scan- Normal head CT -walker -PT evaluation- completed  #Labs  -EKG reviewed, Qtc 442  #Disposition -per primary team  Lenward Chancellor, MD 05/13/2018, 3:12 PMPatient ID: Gloria Perez, female   DOB: 01-28-1961, 57 y.o.   MRN: [id number]

## 2018-05-13 NOTE — Plan of Care (Signed)
  Problem: Activity: Goal: Risk for activity intolerance will decrease Outcome: Progressing Note:  Noted ambulating in halls with use of walker.  Attending groups. Up to dayroom for meals and ambulates to the phones.     Problem: Nutrition: Goal: Adequate nutrition will be maintained Outcome: Progressing Note:  Appetite fair   Problem: Safety: Goal: Ability to remain free from injury will improve Outcome: Progressing Note:  Safety rounds maintained.  No falls this shift   Problem: Medication: Goal: Compliance with prescribed medication regimen will improve Outcome: Progressing Note:  Medication complaint   Problem: Self-Concept: Goal: Ability to disclose and discuss suicidal ideas will improve Outcome: Progressing Note:  Denies si   Problem: Safety: Goal: Ability to disclose and discuss suicidal ideas will improve Outcome: Progressing Note:  Denies SI   Problem: Coping: Goal: Level of anxiety will decrease Outcome: Not Progressing Note:  Patient is anxious.  In hallway with loud outburst of screaming and yelling.  When asked what is wrong states "I want to make a phone call"   Problem: Coping: Goal: Coping ability will improve Outcome: Not Progressing   Problem: Coping: Goal: Ability to verbalize frustrations and anger appropriately will improve Outcome: Not Progressing Note:  Loud outbursts

## 2018-05-13 NOTE — [facility] Group Notes (Signed)
[place] Group Notes:  (Nursing/MHT/Case Management/Adjunct)  Date:  05/13/2018  Time:  10:49 PM  Type of Therapy:  Group Therapy  Participation Level:  Did Not Attend   Barnie Mort 05/13/2018, 10:49 PM

## 2018-05-13 NOTE — Plan of Care (Signed)
Pt in room and then ask to have her bed fixed. Change patients bed sheets, could not get her to take a shower. Pt trembling at times, but was quickly reversed. Pt denies SI/HI. Pt is receptive to treatment and safety maintained on unit. Will continue to monitor.  Problem: Education: Goal: Ability to make informed decisions regarding treatment will improve Outcome: Progressing   Problem: Coping: Goal: Coping ability will improve Outcome: Progressing   Problem: Health Behavior/Discharge Planning: Goal: Identification of resources available to assist in meeting health care needs will improve Outcome: Progressing   Problem: Medication: Goal: Compliance with prescribed medication regimen will improve Outcome: Progressing   Problem: Self-Concept: Goal: Ability to disclose and discuss suicidal ideas will improve Outcome: Progressing Goal: Will verbalize positive feelings about self Outcome: Progressing   Problem: Activity: Goal: Interest or engagement in leisure activities will improve Outcome: Progressing Goal: Imbalance in normal sleep/wake cycle will improve Outcome: Progressing   Problem: Coping: Goal: Will verbalize feelings Outcome: Progressing   Problem: Health Behavior/Discharge Planning: Goal: Ability to make decisions will improve Outcome: Progressing

## 2018-05-14 ENCOUNTER — Encounter: Payer: Self-pay | Admitting: Medical Oncology

## 2018-05-14 ENCOUNTER — Other Ambulatory Visit: Payer: Self-pay

## 2018-05-14 ENCOUNTER — Emergency Department
Admission: EM | Admit: 2018-05-14 | Discharge: 2018-05-14 | Disposition: A | Discharge status: Other Acute Inpatient Hospital | Payer: Medicare (Managed Care) | Attending: Emergency Medicine | Admitting: Emergency Medicine

## 2018-05-14 ENCOUNTER — Inpatient Hospital Stay (HOSPITAL_COMMUNITY)
Admission: AD | Admit: 2018-05-14 | Discharge: 2018-05-18 | Disposition: A | Discharge status: Home / Self Care | Payer: Medicare (Managed Care) | Source: Intra-hospital | Attending: Psychiatry | Admitting: Psychiatry

## 2018-05-14 ENCOUNTER — Emergency Department: Payer: Medicare (Managed Care)

## 2018-05-14 ENCOUNTER — Encounter: Payer: Self-pay | Admitting: *Deleted

## 2018-05-14 DIAGNOSIS — E162 Hypoglycemia, unspecified: Secondary | ICD-10-CM

## 2018-05-14 DIAGNOSIS — F17200 Nicotine dependence, unspecified, uncomplicated: Secondary | ICD-10-CM | POA: Diagnosis present

## 2018-05-14 DIAGNOSIS — J449 Chronic obstructive pulmonary disease, unspecified: Secondary | ICD-10-CM

## 2018-05-14 DIAGNOSIS — R296 Repeated falls: Secondary | ICD-10-CM | POA: Diagnosis present

## 2018-05-14 DIAGNOSIS — R251 Tremor, unspecified: Secondary | ICD-10-CM

## 2018-05-14 DIAGNOSIS — Z888 Allergy status to other drugs, medicaments and biological substances status: Secondary | ICD-10-CM

## 2018-05-14 DIAGNOSIS — F17210 Nicotine dependence, cigarettes, uncomplicated: Secondary | ICD-10-CM | POA: Diagnosis present

## 2018-05-14 DIAGNOSIS — K219 Gastro-esophageal reflux disease without esophagitis: Secondary | ICD-10-CM

## 2018-05-14 DIAGNOSIS — R45851 Suicidal ideations: Secondary | ICD-10-CM

## 2018-05-14 DIAGNOSIS — I110 Hypertensive heart disease with heart failure: Secondary | ICD-10-CM

## 2018-05-14 DIAGNOSIS — I509 Heart failure, unspecified: Secondary | ICD-10-CM

## 2018-05-14 DIAGNOSIS — Z79899 Other long term (current) drug therapy: Secondary | ICD-10-CM

## 2018-05-14 DIAGNOSIS — N390 Urinary tract infection, site not specified: Secondary | ICD-10-CM

## 2018-05-14 DIAGNOSIS — F203 Undifferentiated schizophrenia: Secondary | ICD-10-CM | POA: Diagnosis present

## 2018-05-14 DIAGNOSIS — Z915 Personal history of self-harm: Secondary | ICD-10-CM

## 2018-05-14 DIAGNOSIS — G4700 Insomnia, unspecified: Secondary | ICD-10-CM

## 2018-05-14 DIAGNOSIS — I10 Essential (primary) hypertension: Secondary | ICD-10-CM | POA: Insufficient documentation

## 2018-05-14 DIAGNOSIS — J45909 Unspecified asthma, uncomplicated: Secondary | ICD-10-CM | POA: Insufficient documentation

## 2018-05-14 DIAGNOSIS — R569 Unspecified convulsions: Secondary | ICD-10-CM | POA: Diagnosis present

## 2018-05-14 LAB — URINALYSIS, COMPLETE (UACMP) WITH MICROSCOPIC
Bacteria, UA: NONE SEEN
Bilirubin Urine: NEGATIVE
Glucose, UA: 150 mg/dL — AB
HGB URINE DIPSTICK: NEGATIVE
Ketones, ur: NEGATIVE mg/dL
LEUKOCYTES UA: NEGATIVE
Nitrite: NEGATIVE
PH: 6 (ref 5.0–8.0)
Protein, ur: NEGATIVE mg/dL
SPECIFIC GRAVITY, URINE: 1.012 (ref 1.005–1.030)

## 2018-05-14 LAB — CBC WITH DIFFERENTIAL/PLATELET
Basophils Absolute: 0 10*3/uL (ref 0–0.1)
Basophils Relative: 1 %
EOS ABS: 0.2 10*3/uL (ref 0–0.7)
EOS PCT: 2 %
HCT: 37.7 % (ref 35.0–47.0)
Hemoglobin: 12.6 g/dL (ref 12.0–16.0)
LYMPHS ABS: 1.8 10*3/uL (ref 1.0–3.6)
LYMPHS PCT: 23 %
MCH: 30.3 pg (ref 26.0–34.0)
MCHC: 33.4 g/dL (ref 32.0–36.0)
MCV: 90.9 fL (ref 80.0–100.0)
MONOS PCT: 10 %
Monocytes Absolute: 0.8 10*3/uL (ref 0.2–0.9)
Neutro Abs: 5.1 10*3/uL (ref 1.4–6.5)
Neutrophils Relative %: 64 %
PLATELETS: 285 10*3/uL (ref 150–440)
RBC: 4.15 MIL/uL (ref 3.80–5.20)
RDW: 14.4 % (ref 11.5–14.5)
WBC: 7.9 10*3/uL (ref 3.6–11.0)

## 2018-05-14 LAB — COMPREHENSIVE METABOLIC PANEL
ALT: 31 U/L (ref 0–44)
AST: 50 U/L — AB (ref 15–41)
Albumin: 4.1 g/dL (ref 3.5–5.0)
Alkaline Phosphatase: 86 U/L (ref 38–126)
Anion gap: 17 — ABNORMAL HIGH (ref 5–15)
BUN: 22 mg/dL — AB (ref 6–20)
CHLORIDE: 101 mmol/L (ref 98–111)
CO2: 20 mmol/L — AB (ref 22–32)
CREATININE: 0.99 mg/dL (ref 0.44–1.00)
Calcium: 9.6 mg/dL (ref 8.9–10.3)
GFR calc Af Amer: >60 mL/min (ref >60)
GFR calc non Af Amer: >60 mL/min (ref >60)
Glucose, Bld: 65 mg/dL — ABNORMAL LOW (ref 70–99)
Potassium: 4.3 mmol/L (ref 3.5–5.1)
Sodium: 138 mmol/L (ref 135–145)
Total Bilirubin: 0.6 mg/dL (ref 0.3–1.2)
Total Protein: 7.5 g/dL (ref 6.5–8.1)

## 2018-05-14 LAB — TROPONIN I: Troponin I: <0.03 ng/mL (ref <0.03)

## 2018-05-14 LAB — GLUCOSE, CAPILLARY: Glucose-Capillary: 88 mg/dL (ref 70–99)

## 2018-05-14 MED ORDER — MAGNESIUM HYDROXIDE 400 MG/5ML PO SUSP: 30.0000 mL | Freq: Every day | ORAL | Status: DC | PRN

## 2018-05-14 MED ORDER — BENZTROPINE MESYLATE 1 MG PO TABS
2.0000 mg | ORAL_TABLET | ORAL | Status: DC
  Administered 2018-05-15: 2 mg via ORAL
  Filled 2018-05-14: qty 2

## 2018-05-14 MED ORDER — DULOXETINE HCL 30 MG PO CPEP
30.0000 mg | ORAL_CAPSULE | Freq: Two times a day (BID) | ORAL | Status: DC
  Filled 2018-05-14: qty 1

## 2018-05-14 MED ORDER — DEXTROSE 50 % IV SOLN
INTRAVENOUS | Status: AC
  Filled 2018-05-14: qty 50

## 2018-05-14 MED ORDER — DULOXETINE HCL 30 MG PO CPEP
60.0000 mg | ORAL_CAPSULE | Freq: Every day | ORAL | Status: DC
  Administered 2018-05-15 – 2018-05-16 (×2): 60 mg via ORAL
  Filled 2018-05-14 (×2): qty 2

## 2018-05-14 MED ORDER — BENZTROPINE MESYLATE 1 MG PO TABS
0.5000 mg | ORAL_TABLET | Freq: Two times a day (BID) | ORAL | Status: DC
  Filled 2018-05-14: qty 1

## 2018-05-14 MED ORDER — DIAZEPAM 5 MG PO TABS
5.0000 mg | ORAL_TABLET | Freq: Once | ORAL | Status: AC
  Administered 2018-05-14: 5 mg via ORAL
  Filled 2018-05-14: qty 1

## 2018-05-14 MED ORDER — TRAZODONE HCL 50 MG PO TABS
50.0000 mg | ORAL_TABLET | Freq: Every day | ORAL | Status: DC
  Administered 2018-05-15 – 2018-05-16 (×2): 50 mg via ORAL
  Filled 2018-05-14 (×3): qty 1

## 2018-05-14 MED ORDER — ACETAMINOPHEN 325 MG PO TABS
650.0000 mg | ORAL_TABLET | Freq: Four times a day (QID) | ORAL | Status: DC | PRN
  Administered 2018-05-14 – 2018-05-17 (×3): 650 mg via ORAL
  Filled 2018-05-14 (×3): qty 2

## 2018-05-14 MED ORDER — FLUPHENAZINE DECANOATE 25 MG/ML IJ SOLN: 20.0000 mg | INTRAMUSCULAR | Status: DC

## 2018-05-14 MED ORDER — HYDROXYZINE HCL 25 MG PO TABS: 25.0000 mg | ORAL_TABLET | Freq: Four times a day (QID) | ORAL | Status: DC | PRN

## 2018-05-14 MED ORDER — FLUPHENAZINE DECANOATE 25 MG/ML IJ SOLN
18.7500 mg | Freq: Once | INTRAMUSCULAR | Status: DC
  Filled 2018-05-14: qty 0.8

## 2018-05-14 MED ORDER — LAMOTRIGINE 25 MG PO TABS
25.0000 mg | ORAL_TABLET | Freq: Two times a day (BID) | ORAL | Status: DC
  Administered 2018-05-14 – 2018-05-15 (×2): 25 mg via ORAL
  Filled 2018-05-14 (×3): qty 1

## 2018-05-14 MED ORDER — DEXTROSE 50 % IV SOLN
1.0000 | Freq: Once | INTRAVENOUS | Status: AC
Start: 2018-05-14 — End: 2018-05-14
  Administered 2018-05-14: 50 mL via INTRAVENOUS

## 2018-05-14 MED ORDER — TIOTROPIUM BROMIDE MONOHYDRATE 18 MCG IN CAPS
18.0000 ug | ORAL_CAPSULE | Freq: Every day | RESPIRATORY_TRACT | Status: DC
  Administered 2018-05-15 – 2018-05-18 (×4): 18 ug via RESPIRATORY_TRACT
  Filled 2018-05-14 (×2): qty 5

## 2018-05-14 MED ORDER — ALUM & MAG HYDROXIDE-SIMETH 200-200-20 MG/5ML PO SUSP: 30.0000 mL | ORAL | Status: DC | PRN

## 2018-05-14 MED ORDER — BUPROPION HCL ER (SR) 150 MG PO TB12
150.0000 mg | ORAL_TABLET | Freq: Two times a day (BID) | ORAL | Status: DC
  Filled 2018-05-14: qty 1

## 2018-05-14 MED ORDER — VITAMIN D3 25 MCG (1000 UNIT) PO TABS
1000.0000 [IU] | ORAL_TABLET | Freq: Every day | ORAL | Status: DC
  Administered 2018-05-15 – 2018-05-18 (×4): 1000 [IU] via ORAL
  Filled 2018-05-14 (×8): qty 1

## 2018-05-14 MED ORDER — PANTOPRAZOLE SODIUM 40 MG PO TBEC
40.0000 mg | DELAYED_RELEASE_TABLET | Freq: Every day | ORAL | Status: DC
  Administered 2018-05-14 – 2018-05-18 (×5): 40 mg via ORAL
  Filled 2018-05-14 (×5): qty 1

## 2018-05-14 MED ORDER — AMANTADINE HCL 100 MG PO CAPS
100.0000 mg | ORAL_CAPSULE | Freq: Two times a day (BID) | ORAL | Status: DC
  Administered 2018-05-14 – 2018-05-15 (×2): 100 mg via ORAL
  Filled 2018-05-14 (×2): qty 1

## 2018-05-14 MED ORDER — BUPROPION HCL ER (XL) 150 MG PO TB24: 150.0000 mg | ORAL_TABLET | Freq: Every day | ORAL | Status: DC

## 2018-05-14 MED ORDER — TRAZODONE HCL 50 MG PO TABS
50.0000 mg | ORAL_TABLET | Freq: Every evening | ORAL | Status: DC | PRN
  Administered 2018-05-14: 50 mg via ORAL

## 2018-05-14 NOTE — ED Triage Notes (Signed)
Pt to ED from BMU where RN reports pt was walking back from shower with walker and RN when she "stiffened up" and had possible seizure like activity. Pt was lowered to the ground by RN, did not hit head. Pt alert and responds to verbal stimuli with a moan. CBG in BMU was 88.

## 2018-05-14 NOTE — Progress Notes (Signed)
Chaplain received page for code blue on [place]. Chaplain went to PT room and PT was lying on floor. Care Team was working with PT. Chaplain made her pastoral presence know and nurse ask Chaplain if she could stand in hall. They were sending PT to ED. Chaplain step out of room and waiting in hall. Nurse told Chaplain she could check in ED on PT later. Chaplain said ok and if they needed her before then, please page her. Nurse said ok and thank you.

## 2018-05-14 NOTE — Significant Event (Signed)
Rapid Response Event Note  Overview: Time Called: 6503 Arrival Time: 1055 Event Type: Neurologic  Initial Focused Assessment: called for rapid response on pt in behavioral room 20, pt laying in floor (RN noted did NOT fall), helped to ground. Seizure activity noted, eyes side to side, and drooling with unlabored but uneven breaths.    Interventions: Nursing supervisor advised to get ED gurney for transfer to ED for post seizure care. ED team arrived to transport pt.  Plan of Care (if not transferred):  Event Summary:   at      at    Outcome: Transferred (Comment)([place] to ED)  Event End Time: [place]

## 2018-05-14 NOTE — H&P (Addendum)
Psychiatric Admission Assessment Adult  Patient Identification: Gloria Perez MRN:  [id number] Date of Evaluation:  05/15/2018 Chief Complaint:  Undifferentiated schizophrenia  Principal Diagnosis: Undifferentiated schizophrenia ([place]) Diagnosis:   Patient Active Problem List   Diagnosis Date Noted  . Undifferentiated schizophrenia ([place]) [F20.3] 04/05/2018    Priority: High  . Tobacco use disorder [F17.200] 05/07/2018   History of Present Illness:   Identifying data. Gloria Perez is a 57 year old female with a history of schizophrenia.  Chief complaint. "I don't know what happened."  History of present illness. Information was obtained from the patient and the chart. The patient initially came to the ER reporting suicidal ideation. Her group home rwported change in her behavior with paranoia for about two week duration. The patient has been maintained on Prlolixin decanoate injections of 18.5 mg every 3 weeks. Suicidal ideation resolved quickly. She was diagnosed with UTI and treated with Keflex. She experienced a seizure-like episode and was briefly transferred to the ER where she was diagnosed with hypoglycemia.  This morning, the patient is utterly confused. She insists on going "home with my children in [place]" while in fact she resided at the [facility] group home in [place]. She also wants to be taken "to the top of my head". Her sugar level was 74 but VS are unstable. She is tremulous. She has a Actuary. .   Past psychiatric history. Long history pf schizophrenia with multiple hospitalizations and medication trials. Suicide attempt in remote past. She is in the care of [facility] ACT team maintained on Prolixin Decanoate.   Family psychiatric history. None reported.  Social history. She is disabled from mental illness and lives in a group home. She has 4 sisters and children and 8 grandchildren but does not stay in touch.  Total Time spent with patient: 1 hour  Is the patient at risk to  self? No.  Has the patient been a risk to self in the past 6 months? No.  Has the patient been a risk to self within the distant past? No.  Is the patient a risk to others? No.  Has the patient been a risk to others in the past 6 months? No.  Has the patient been a risk to others within the distant past? No.   Prior Inpatient Therapy:   Prior Outpatient Therapy:    Alcohol Screening: 1. How often do you have a drink containing alcohol?: Never 2. How many drinks containing alcohol do you have on a typical day when you are drinking?: 1 or 2 3. How often do you have six or more drinks on one occasion?: Never AUDIT-C Score: 0 4. How often during the last year have you found that you were not able to stop drinking once you had started?: Never 5. How often during the last year have you failed to do what was normally expected from you becasue of drinking?: Never 6. How often during the last year have you needed a first drink in the morning to get yourself going after a heavy drinking session?: Never 7. How often during the last year have you had a feeling of guilt of remorse after drinking?: Never 8. How often during the last year have you been unable to remember what happened the night before because you had been drinking?: Never 9. Have you or someone else been injured as a result of your drinking?: No 10. Has a relative or friend or a doctor or another health worker been concerned about your drinking or  suggested you cut down?: No Alcohol Use Disorder Identification Test Final Score (AUDIT): 0 Intervention/Follow-up: AUDIT Score <7 follow-up not indicated Substance Abuse History in the last 12 months:  No. Consequences of Substance Abuse: NA Previous Psychotropic Medications: Yes  Psychological Evaluations: No  Past Medical History:  Past Medical History:  Diagnosis Date  . Abnormal thyroid blood test   . Acute left-sided low back pain without sciatica   . Asthma   . B12 deficiency   .  CHF (congestive heart failure) ([place])   . Chronic pain   . Contusion of lower back   . COPD (chronic obstructive pulmonary disease) ([place])   . Falling episodes   . GERD (gastroesophageal reflux disease)   . Hepatitis   . Hypertension   . Nonischemic cardiomyopathy ([place])   . Physical debility   . Pre-diabetes   . Schizophrenia ([place])   . Severe obesity (BMI 35.0-35.9 with comorbidity) ([place])   . Vitamin D deficiency   . Weight loss     Past Surgical History:  Procedure Laterality Date  . CESAREAN SECTION    . COLONOSCOPY    . COLONOSCOPY WITH PROPOFOL N/A 02/22/2018   Procedure: COLONOSCOPY WITH PROPOFOL;  Surgeon: Toledo, Benay Pike, MD;  Location: [facility] ENDOSCOPY;  Service: Gastroenterology;  Laterality: N/A;  . Right arm surgery    . TUBAL LIGATION     Family History:  Family History  Problem Relation Age of Onset  . Diabetes Mother    Tobacco Screening: Have you used any form of tobacco in the last 30 days? (Cigarettes, Smokeless Tobacco, Cigars, and/or Pipes): No Social History:  Social History   Substance and Sexual Activity  Alcohol Use Not Currently     Social History   Substance and Sexual Activity  Drug Use Not Currently    Additional Social History: Marital status: Widowed Widowed, when?: "I don't know" Are you sexually active?: No What is your sexual orientation?: Heterosexual Has your sexual activity been affected by drugs, alcohol, medication, or emotional stress?: No Does patient have children?: Yes How is patient's relationship with their children?: pt stated that she has 2 daughters and 1 son.  She shared that she doesn't really talk with her children much and that they do not visit with her.  He shared that she also have 8 grandchildren    History of alcohol / drug use?: No history of alcohol / drug abuse                    Allergies:   Allergies  Allergen Reactions  . Lisinopril   . Metformin And Related    Lab Results:  Results for  orders placed or performed during the hospital encounter of 05/14/18 (from the past 48 hour(s))  Glucose, capillary     Status: None   Collection Time: 05/14/18  2:36 PM  Result Value Ref Range   Glucose-Capillary 81 70 - 99 mg/dL  Glucose, capillary     Status: None   Collection Time: 05/14/18  2:38 PM  Result Value Ref Range   Glucose-Capillary 89 70 - 99 mg/dL  Glucose, capillary     Status: Abnormal   Collection Time: 05/14/18  3:51 PM  Result Value Ref Range   Glucose-Capillary 66 (L) 70 - 99 mg/dL  Glucose, capillary     Status: None   Collection Time: 05/14/18  4:12 PM  Result Value Ref Range   Glucose-Capillary 74 70 - 99 mg/dL    Blood Alcohol  level:  Lab Results  Component Value Date   ETH <10 05/02/2018   ETH <10 62/70/3500    Metabolic Disorder Labs:  Lab Results  Component Value Date   HGBA1C 4.8 05/04/2018   MPG 91.06 05/04/2018   MPG 88.19 05/02/2018   No results found for: PROLACTIN Lab Results  Component Value Date   CHOL 189 05/04/2018   TRIG 136 05/04/2018   HDL 79 05/04/2018   CHOLHDL 2.4 05/04/2018   VLDL 27 05/04/2018   LDLCALC 83 05/04/2018   LDLCALC 58 08/17/2017    Current Medications: Current Facility-Administered Medications  Medication Dose Route Frequency Provider Last Rate Last Dose  . acetaminophen (TYLENOL) tablet 650 mg  650 mg Oral Q6H PRN Lenward Chancellor, MD   650 mg at 05/15/18 0753  . alum & mag hydroxide-simeth (MAALOX/MYLANTA) 200-200-20 MG/5ML suspension 30 mL  30 mL Oral Q4H PRN Lenward Chancellor, MD      . amantadine (SYMMETREL) capsule 100 mg  100 mg Oral BID Pucilowska, Jolanta B, MD   100 mg at 05/15/18 0755  . benztropine (COGENTIN) tablet 2 mg  2 mg Oral BH-q7a Pucilowska, Jolanta B, MD   2 mg at 05/15/18 0754  . cholecalciferol (VITAMIN D) tablet 1,000 Units  1,000 Units Oral Daily Lenward Chancellor, MD   1,000 Units at 05/15/18 0753  . DULoxetine (CYMBALTA) DR capsule 60 mg  60 mg Oral Daily Pucilowska, Jolanta B,  MD   60 mg at 05/15/18 0753  . [START ON 05/28/2018] fluPHENAZine decanoate (PROLIXIN) injection 20 mg  20 mg Intramuscular Q21 days Pucilowska, Jolanta B, MD      . lamoTRIgine (LAMICTAL) tablet 25 mg  25 mg Oral BID Lenward Chancellor, MD   25 mg at 05/15/18 0754  . magnesium hydroxide (MILK OF MAGNESIA) suspension 30 mL  30 mL Oral Daily PRN Lenward Chancellor, MD      . pantoprazole (PROTONIX) EC tablet 40 mg  40 mg Oral Daily Lenward Chancellor, MD   40 mg at 05/15/18 0755  . tiotropium (SPIRIVA) inhalation capsule 18 mcg  18 mcg Inhalation Daily Lenward Chancellor, MD   18 mcg at 05/15/18 0756  . traZODone (DESYREL) tablet 50 mg  50 mg Oral QHS PRN Lenward Chancellor, MD   50 mg at 05/14/18 2102  . traZODone (DESYREL) tablet 50 mg  50 mg Oral QHS Lenward Chancellor, MD       PTA Medications: Medications Prior to Admission  Medication Sig Dispense Refill Last Dose  . benztropine (COGENTIN) 0.5 MG tablet Take 0.5 mg by mouth 2 (two) times daily.   05/02/2018 at 0800  . buPROPion (WELLBUTRIN SR) 150 MG 12 hr tablet Take 150 mg by mouth 2 (two) times daily.   05/02/2018 at 0800  . Cholecalciferol 1000 units tablet Take 1,000 Units by mouth daily.    05/02/2018 at 0800  . DULoxetine (CYMBALTA) 30 MG capsule Take 30 mg by mouth 2 (two) times daily.    05/02/2018 at 0800  . hydrOXYzine (ATARAX/VISTARIL) 25 MG tablet Take 25 mg by mouth every evening.    05/01/2018 at 2000  . lamoTRIgine (LAMICTAL) 25 MG tablet Take 25 mg by mouth 2 (two) times daily.   05/02/2018 at 0800  . lisinopril (PRINIVIL,ZESTRIL) 5 MG tablet Take 5 mg by mouth daily.   05/02/2018 at 0800  . omeprazole (PRILOSEC) 20 MG capsule Take 20 mg by mouth daily.   05/02/2018 at 0800  . tiotropium (SPIRIVA) 18 MCG inhalation capsule Place 18 mcg  into inhaler and inhale daily.   02/22/2018 at Unknown time  . traZODone (DESYREL) 50 MG tablet Take 50 mg by mouth at bedtime.   05/01/2018 at 2000    Musculoskeletal: Strength & Muscle Tone: within  normal limits Gait & Station: normal Patient leans: N/A  Psychiatric Specialty Exam: I reviewed physical examination performed in the ER and agree with the findings. Physical Exam  Nursing note and vitals reviewed. Psychiatric: She has a normal mood and affect. Her speech is normal. Judgment and thought content normal. She is actively hallucinating. Cognition and memory are normal.    Review of Systems  Neurological: Negative.   Psychiatric/Behavioral: Positive for hallucinations.  All other systems reviewed and are negative.   Blood pressure (!) 112/97, pulse (!) 133, temperature 98.7 F (37.1 C), temperature source Oral, resp. rate 20, height 5' (1.524 m), weight 65.8 kg (145 lb), SpO2 (!) 78 %.Body mass index is 28.32 kg/m.  See SRA                                                  Sleep:  Number of Hours: 2    Treatment Plan Summary: Daily contact with patient to assess and evaluate symptoms and progress in treatment and Medication management   Gloria Perez is a 56 year old female with a history of schizophrenia admitted for psychotic break who returns after brief visit in the ER for episode of hypoglycemia and seizure.  #Mood/psychosis -continue Prolixin 18.5 mg every 3 weeks -patient was given Prolixin 50 mg IM on 7/1 -discontinue Cogentin 2 mg daily -discontinue Symmetrel 100 mg BID -discontinue Wellbutrin 150 mg daily -continue Cymbalta 60 mg daily  #Insomnia -slept 2 hours only  #Asthma -continue Spiriva  #UTI, resolved -completed a course of Keflex  #Hypoglycemia, resolved -CBG  #Seizure -discontinue Lamictal 25 mg BID started in ER -head CT scan negative -1:1 sitter  #Labs completed  #Smoking cessation -nicotine patch is available  #Disposition -discharge back to her group home -follow up with ACT team   Observation Level/Precautions:  Continuous Observation  Laboratory:  CBC Chemistry Profile UA  Psychotherapy:     Medications:    Consultations:    Discharge Concerns:    Estimated LOS:  Other:     Physician Treatment Plan for Primary Diagnosis: Undifferentiated schizophrenia ([place]) Long Term Goal(s): Improvement in symptoms so as ready for discharge  Short Term Goals: Ability to identify changes in lifestyle to reduce recurrence of condition will improve, Ability to verbalize feelings will improve, Ability to disclose and discuss suicidal ideas, Ability to demonstrate self-control will improve, Ability to identify and develop effective coping behaviors will improve, Ability to maintain clinical measurements within normal limits will improve, Compliance with prescribed medications will improve and Ability to identify triggers associated with substance abuse/mental health issues will improve  Physician Treatment Plan for Secondary Diagnosis: Principal Problem:   Undifferentiated schizophrenia ([place]) Active Problems:   Tobacco use disorder  Long Term Goal(s): NA  Short Term Goals: NA  I certify that inpatient services furnished can reasonably be expected to improve the patient's condition.    Orson Slick, MD 7/8/201910:06 AM

## 2018-05-14 NOTE — Discharge Summary (Signed)
Physician Discharge Summary Note  Patient:  Gloria Perez is an 57 y.o., female MRN:  [id number] DOB:  1961/06/21 Patient phone:  [phone] (home)  Patient address:   [place] [address],  Total Time spent with patient: 35 min  Date of Admission:  05/02/2018 Date of Discharge: 05/14/18  Reason for Admission:   Per H & P note- pt presented to ED and admitted under IVC, Per report pt with complaints of suicidal ideation and possible worsening psychosis.  Her group home reports that her behavior has been different for the last couple weeks more paranoid and withdrawn.  Patient was not able to give much history yesterday but was endorsing suicidal ideation.  Per the patient's [facility] ACT Team [phone]), the last two weeks the patient's behaviors have changed. She's easily agitated and irritable. She believe people are stealing from her and an increase in paranoia. Patient have been in the same [facility] for approximately two years and her current behaviors are new for her  On evaluation  she appears more physically unwell.  Her blood pressure is low and she is complaining of feeling dizzy.  Denies acute suicidal intent.  Vague about hallucinations, AH      Principal Problem: <principal problem not specified> Discharge Diagnoses: Patient Active Problem List   Diagnosis Date Noted  . Tobacco use disorder [F17.200] 05/07/2018  . Undifferentiated schizophrenia ([place]) [F20.3] 04/05/2018    Past Psychiatric History: Patient has a long history of schizophrenia and has had prior hospitalizations.  Past history of suicide attempts distant.  Chronically managed on Prolixin Decanoate usually pretty stable.   Past Medical History:  Past Medical History:  Diagnosis Date  . Abnormal thyroid blood test   . Acute left-sided low back pain without sciatica   . Asthma   . B12 deficiency   . CHF (congestive heart failure) ([place])   . Chronic pain   .  Contusion of lower back   . COPD (chronic obstructive pulmonary disease) ([place])   . Falling episodes   . GERD (gastroesophageal reflux disease)   . Hepatitis   . Hypertension   . Nonischemic cardiomyopathy ([place])   . Physical debility   . Pre-diabetes   . Schizophrenia ([place])   . Severe obesity (BMI 35.0-35.9 with comorbidity) ([place])   . Vitamin D deficiency   . Weight loss     Past Surgical History:  Procedure Laterality Date  . CESAREAN SECTION    . COLONOSCOPY    . COLONOSCOPY WITH PROPOFOL N/A 02/22/2018   Procedure: COLONOSCOPY WITH PROPOFOL;  Surgeon: Toledo, Benay Pike, MD;  Location: [facility] ENDOSCOPY;  Service: Gastroenterology;  Laterality: N/A;  . Right arm surgery    . TUBAL LIGATION     Family History:  Family History  Problem Relation Age of Onset  . Diabetes Mother    Family Psychiatric  History: Patient denies knowing of any   Social History:  Social History   Substance and Sexual Activity  Alcohol Use Not Currently     Social History   Substance and Sexual Activity  Drug Use Not Currently    Social History   Socioeconomic History  . Marital status: Widowed    Spouse name: Not on file  . Number of children: Not on file  . Years of education: Not on file  . Highest education level: Not on file  Occupational History  . Not on file  Social Needs  . Financial resource strain:  Not on file  . Food insecurity:    Worry: Not on file    Inability: Not on file  . Transportation needs:    Medical: Not on file    Non-medical: Not on file  Tobacco Use  . Smoking status: Current Every Day Smoker    Packs/day: 0.50    Years: 35.00    Pack years: 17.50    Types: Cigarettes  . Smokeless tobacco: Never Used  Substance and Sexual Activity  . Alcohol use: Not Currently  . Drug use: Not Currently  . Sexual activity: Not on file  Lifestyle  . Physical activity:    Days per week: Not on file    Minutes per session: Not on file  . Stress: Not on file   Relationships  . Social connections:    Talks on phone: Not on file    Gets together: Not on file    Attends religious service: Not on file    Active member of club or organization: Not on file    Attends meetings of clubs or organizations: Not on file    Relationship status: Not on file  Other Topics Concern  . Not on file  Social History Narrative  . Not on file    Hospital Course:   Pt presented with psychosis, SI as above. Collateral obtained. Prolixin continued, prolixin dec restarted- received 50 mg on 05/08/18.  Cymbalta and wellbutrin continued. Pt no longer suicidal, but remained psychotic, anxious, labile.On 05/14/18   pt had AMS, with pt stiff with possible seizure, rapid response called, pt transferred./discharged to ED for medical clearance.  Pt most likely will be readmitted to psych unit when medically cleared.   Physical Findings: AIMS:  , ,  ,  ,    CIWA:    COWS:     Musculoskeletal: Strength & Muscle Tone: within normal limits Gait & Station: normal Patient leans:   Psychiatric Specialty Exam: Physical Exam  Nursing note and vitals reviewed.   ROS  Blood pressure 125/83, pulse 98, temperature 97.8 F (36.6 C), temperature source Oral, resp. rate 16, height 5' (1.524 m), weight 65.8 kg (145 lb), SpO2 100 %.Body mass index is 28.32 kg/m.  General Appearance: Casual, walking with walker  Eye Contact:  Good  Speech:  Clear and Coherent  Volume:  Normal  Mood:  Euthymic  Affect:  Flat  Thought Process:  Goal Directed and Descriptions of Associations: Intact  Orientation:  Full (Time, Place, and Person)  Thought Content:  WDL  Suicidal Thoughts:  No, denies  Homicidal Thoughts:  No  Memory:  Immediate;   Fair Recent;   Fair Remote;   Fair  Judgement:  Poor  Insight:  Shallow  Psychomotor Activity:  Decreased  Concentration:  Concentration: Poor and Attention Span: Poor  Recall:  Poor  Fund of Knowledge:  Fair  Language:  Fair  Akathisia:  No   Handed:  Right  AIMS (if indicated):     Assets:  Communication Skills Desire for Improvement Financial Resources/Insurance Housing Physical Health Resilience Social Support  ADL's:  Intact  Cognition:  WNL  Sleep:  Number of Hours:          Has this patient used any form of tobacco in the last 30 days? (Cigarettes, Smokeless Tobacco, Cigars, and/or Pipes) Yes, No  Blood Alcohol level:  Lab Results  Component Value Date   ETH <10 05/02/2018   ETH <10 74/06/1447    Metabolic Disorder Labs:  Lab Results  Component Value Date   HGBA1C 4.8 05/04/2018   MPG 91.06 05/04/2018   MPG 88.19 05/02/2018   No results found for: PROLACTIN Lab Results  Component Value Date   CHOL 189 05/04/2018   TRIG 136 05/04/2018   HDL 79 05/04/2018   CHOLHDL 2.4 05/04/2018   VLDL 27 05/04/2018   LDLCALC 83 05/04/2018   LDLCALC 58 08/17/2017    See Psychiatric Specialty Exam and Suicide Risk Assessment completed by Attending Physician prior to discharge.  Discharge destination:  ED  Is patient on multiple antipsychotic therapies at discharge:  No   Has Patient had three or more failed trials of antipsychotic monotherapy by history:  No  Recommended Plan for Multiple Antipsychotic Therapies: NA   Allergies as of 05/02/2018      Reactions   Lisinopril    Metformin And Related       Medication List    ASK your doctor about these medications     Indication  benztropine 0.5 MG tablet Commonly known as:  COGENTIN Take 0.5 mg by mouth 2 (two) times daily.    buPROPion 150 MG 12 hr tablet Commonly known as:  WELLBUTRIN SR Take 150 mg by mouth 2 (two) times daily.    Cholecalciferol 1000 units tablet Take 1,000 Units by mouth daily.    DULoxetine 30 MG capsule Commonly known as:  CYMBALTA Take 30 mg by mouth 2 (two) times daily.    hydrOXYzine 25 MG tablet Commonly known as:  ATARAX/VISTARIL Take 25 mg by mouth every evening.    lamoTRIgine 25 MG tablet Commonly known  as:  LAMICTAL Take 25 mg by mouth 2 (two) times daily.    lisinopril 5 MG tablet Commonly known as:  PRINIVIL,ZESTRIL Take 5 mg by mouth daily.    omeprazole 20 MG capsule Commonly known as:  PRILOSEC Take 20 mg by mouth daily.    tiotropium 18 MCG inhalation capsule Commonly known as:  SPIRIVA Place 18 mcg into inhaler and inhale daily.    traZODone 50 MG tablet Commonly known as:  DESYREL Take 50 mg by mouth at bedtime.         Follow-up recommendations:  Will be  made  Comments:    Signed: Lenward Chancellor, MD 05/14/2018, 3:33 PM

## 2018-05-14 NOTE — ED Notes (Signed)
Pt now able to state month and day of birth but not year. Pt states shakes head to answer yes when asked if she has had seizures in the past and yes to taking medications for it. Pt seems jittery with arm movements

## 2018-05-14 NOTE — ED Notes (Signed)
Gloria Glazer RN attempted IV in right ac unsuccessfully, susan RN attempted to start iv left AC unsuccessfully

## 2018-05-14 NOTE — ED Notes (Signed)
Patient transported to CT 

## 2018-05-14 NOTE — Tx Team (Signed)
Initial Treatment Plan 05/14/2018 7:08 PM Gloria Perez Gloria Perez [id number]    PATIENT STRESSORS: Health problems   PATIENT STRENGTHS: Motivation for treatment/growth   PATIENT IDENTIFIED PROBLEMS: Get anxiety under control  Stabilization in mood                   DISCHARGE CRITERIA:  Adequate post-discharge living arrangements Improved stabilization in mood, thinking, and/or behavior  PRELIMINARY DISCHARGE PLAN: Return to previous living arrangement  PATIENT/FAMILY INVOLVEMENT: This treatment plan has been presented to and reviewed with the patient, Gloria Perez, and/or family member, .  The patient and family have been given the opportunity to ask questions and make suggestions.  Rise Mu, RN 05/14/2018, 7:08 PM

## 2018-05-14 NOTE — ED Provider Notes (Signed)
[facility] Emergency Department Provider Note       Time seen: ----------------------------------------- 11:23 AM on 05/14/2018 -----------------------------------------  Level V caveat: History/ROS limited by altered mental status I have reviewed the triage vital signs and the nursing notes.  HISTORY   Chief Complaint No chief complaint on file.   HPI Gloria Perez is a 57 y.o. female with a history of asthma, CHF, COPD, GERD, cardiomyopathy, schizophrenia who presents to the ED for altered mental status.  Reportedly she was found wandering in the behavioral health unit and was not acting normally.  Patient is under involuntary commitment at this time, it was unclear if she had a seizure.  She had stiffened up but was not having any specific shaking episode.  She did not appear to be incontinent.  She cannot give any further review of systems or report.  Past Medical History:  Diagnosis Date  . Abnormal thyroid blood test   . Acute left-sided low back pain without sciatica   . Asthma   . B12 deficiency   . CHF (congestive heart failure) ([place])   . Chronic pain   . Contusion of lower back   . COPD (chronic obstructive pulmonary disease) ([place])   . Falling episodes   . GERD (gastroesophageal reflux disease)   . Hepatitis   . Hypertension   . Nonischemic cardiomyopathy ([place])   . Physical debility   . Pre-diabetes   . Schizophrenia ([place])   . Severe obesity (BMI 35.0-35.9 with comorbidity) ([place])   . Vitamin D deficiency   . Weight loss     Patient Active Problem List   Diagnosis Date Noted  . Tobacco use disorder 05/07/2018  . 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 CESAREAN SECTION    . COLONOSCOPY    . COLONOSCOPY WITH PROPOFOL N/A 02/22/2018   Procedure: COLONOSCOPY WITH PROPOFOL;  Surgeon: Toledo, Benay Pike, MD;  Location: [facility] ENDOSCOPY;  Service: Gastroenterology;   Laterality: N/A;  . Right arm surgery    . TUBAL LIGATION      Allergies Lisinopril and Metformin and related  Social History Social History   Tobacco Use  . Smoking status: Current Every Day Smoker    Packs/day: 0.50    Years: 35.00    Pack years: 17.50    Types: Cigarettes  . Smokeless tobacco: Never Used  Substance Use Topics  . Alcohol use: Not Currently  . Drug use: Not Currently   Review of Systems Unknown at this time, patient with altered mental status  All systems negative/normal/unremarkable except as stated in the HPI  ____________________________________________   PHYSICAL EXAM:  VITAL SIGNS: ED Triage Vitals  Enc Vitals Group     BP      Pulse      Resp      Temp      Temp src      SpO2      Weight      Height      Head Circumference      Peak Flow      Pain Score      Pain Loc      Pain Edu?      Excl. in [place]?    Constitutional: Alert but disoriented, no distress Eyes: Conjunctivae are normal. Normal extraocular movements. ENT   Head: Normocephalic and atraumatic.   Nose: No congestion/rhinnorhea.   Mouth/Throat: Mucous membranes are moist.   Neck: No stridor.  Cardiovascular: Normal rate, regular rhythm. No murmurs, rubs, or gallops. Respiratory: Normal respiratory effort without tachypnea nor retractions. Breath sounds are clear and equal bilaterally. No wheezes/rales/rhonchi. Gastrointestinal: Soft and nontender. Normal bowel sounds Musculoskeletal: Nontender with normal range of motion in extremities. No lower extremity tenderness nor edema. Neurologic:  Normal speech and language. No gross focal neurologic deficits are appreciated.  Intermittently follows commands and localizes to pain Skin:  Skin is warm, dry and intact. No rash noted. Psychiatric: Bizarre affect at times ____________________________________________  ED COURSE:  As part of my medical decision making, I reviewed the following data within the [place] History obtained from family if available, nursing notes, old chart and ekg, as well as notes from prior ED visits. Patient presented for altered mental status, we will assess with labs and imaging as indicated at this time. Clinical Course as of May 15 1411  Sun May 14, 2018  1228 Patient received D50 and is much more alert, did have likely hypoglycemic event.   [JW]  1313 Patient has eaten well while in the ER   [JW]    Clinical Course User Index [JW] Earleen Newport, MD   Procedures ____________________________________________   LABS (pertinent positives/negatives)  Labs Reviewed  COMPREHENSIVE METABOLIC PANEL - Abnormal; Notable for the following components:      Result Value   CO2 20 (*)    Glucose, Bld 65 (*)    BUN 22 (*)    AST 50 (*)    Anion gap 17 (*)    All other components within normal limits  URINALYSIS, COMPLETE (UACMP) WITH MICROSCOPIC - Abnormal; Notable for the following components:   Color, Urine YELLOW (*)    APPearance CLEAR (*)    Glucose, UA 150 (*)    All other components within normal limits  CBC WITH DIFFERENTIAL/PLATELET  TROPONIN I  CBG MONITORING, ED    RADIOLOGY Images were viewed by me  CT head IMPRESSION: Foci of arterial vascular calcification. Study otherwise unremarkable.  ____________________________________________  DIFFERENTIAL DIAGNOSIS   Medication reaction, schizophrenia, CVA, hypoglycemia, occult infection  FINAL ASSESSMENT AND PLAN  Altered mental status secondary to hypoglycemia   Plan: The patient had presented for altered mental status. Patient's labs were unremarkable with the exception of hypoglycemia.  Patient received D50 and had complete resolution in her symptoms and was back to her baseline.  She subsequently ate and has been acting normally.  Patient's imaging is negative for any acute process.  I am not sure why her blood sugar dropped, this will need to be checked frequently.   Otherwise she is stable for readmission to the psychiatric unit.   Laurence Aly, MD   Note: This note was generated in part or whole with voice recognition software. Voice recognition is usually quite accurate but there are transcription errors that can and very often do occur. I apologize for any typographical errors that were not detected and corrected.     Earleen Newport, MD 05/14/18 1414

## 2018-05-14 NOTE — ED Notes (Addendum)
BS was 66. Pt was given two chocolate milks and a sandwich tray BS now up to 74.

## 2018-05-14 NOTE — Progress Notes (Signed)
57 year old female re-admitted to unit from ED. Body search and skin assessment performed.  No contraband found.  No broken areas noted to skin.

## 2018-05-14 NOTE — ED Notes (Signed)
Urine collected and sent to lab for pt. Pt currently unsteady on feet

## 2018-05-14 NOTE — BHH Suicide Risk Assessment (Signed)
[facility] Admission Suicide Risk Assessment   Nursing information obtained from:  Patient Demographic factors:  Low socioeconomic status, Unemployed Current Mental Status:  NA Loss Factors:  Decline in physical health Historical Factors:  Prior suicide attempts Risk Reduction Factors:  NA  Total Time spent with patient: 1 hour Principal Problem: Undifferentiated schizophrenia ([place]) Diagnosis:   Patient Active Problem List   Diagnosis Date Noted  . Undifferentiated schizophrenia ([place]) [F20.3] 04/05/2018    Priority: High  . Tobacco use disorder [F17.200] 05/07/2018   Subjective Data: psychosis  Continued Clinical Symptoms:  Alcohol Use Disorder Identification Test Final Score (AUDIT): 0 The "Alcohol Use Disorders Identification Test", Guidelines for Use in Primary Care, Second Edition.  World Pharmacologist [facility]). Score between 0-7:  no or low risk or alcohol related problems. Score between 8-15:  moderate risk of alcohol related problems. Score between 16-19:  high risk of alcohol related problems. Score 20 or above:  warrants further diagnostic evaluation for alcohol dependence and treatment.   CLINICAL FACTORS:   Schizophrenia:   Paranoid or undifferentiated type   Musculoskeletal: Strength & Muscle Tone: within normal limits Gait & Station: normal Patient leans: N/A  Psychiatric Specialty Exam: Physical Exam  Nursing note and vitals reviewed. Psychiatric: She has a normal mood and affect. Her speech is normal and behavior is normal. Judgment and thought content normal. Cognition and memory are normal.    Review of Systems  Neurological: Positive for dizziness, tremors and seizures.  Psychiatric/Behavioral: Positive for hallucinations.  All other systems reviewed and are negative.   Blood pressure (!) 112/97, pulse (!) 133, temperature 98.7 F (37.1 C), temperature source Oral, resp. rate 20, height 5' (1.524 m), weight 65.8 kg (145 lb), SpO2 (!) 78 %.Body mass  index is 28.32 kg/m.  General Appearance: Casual  Eye Contact:  Good  Speech:  Clear and Coherent  Volume:  Normal  Mood:  Euthymic  Affect:  Appropriate  Thought Process:  Goal Directed and Descriptions of Associations: Intact  Orientation:  Full (Time, Place, and Person)  Thought Content:  Hallucinations: Auditory  Suicidal Thoughts:  No  Homicidal Thoughts:  No  Memory:  Immediate;   Fair Recent;   Fair Remote;   Fair  Judgement:  Impaired  Insight:  Shallow  Psychomotor Activity:  Normal  Concentration:  Concentration: Poor and Attention Span: Poor  Recall:  Poor  Fund of Knowledge:  Fair  Language:  Fair  Akathisia:  Yes  Handed:  Right  AIMS (if indicated):     Assets:  Communication Skills Desire for Improvement Financial Resources/Insurance Housing Resilience Social Support  ADL's:  Intact  Cognition:  WNL  Sleep:  Number of Hours: 2      COGNITIVE FEATURES THAT CONTRIBUTE TO RISK:  None    SUICIDE RISK:   Minimal: No identifiable suicidal ideation.  Patients presenting with no risk factors but with morbid ruminations; may be classified as minimal risk based on the severity of the depressive symptoms  PLAN OF CARE: hospital admission, medication management, discharge planning.  Gloria Perez is a 57 year old female with a history of schizophrenia admitted for psychotic break who returns after brief visit in the ER for episode of hypoglycemia and seizure.  #Mood/psychosis -continue Prolixin 18.5 mg every 3 weeks -patient received Prolixin 50 mg IM on 7/1 -continue Cogentin 2 mg daily -continue Symmetrel 100 mg BID -discontinue Wellbutrin 150 mg daily -continue Cymbalta 60 mg daily  #Asthma -continue Spiriva  #UTI, resolved -completed a course of  Keflex  #Hypoglycemia, resolved -CBG  #Seizure -Lamictal 25 mg BID started in ER -head CT scan negative -1:1 sitter  #Labs completed  #Smoking cessation -nicotine patch is  available  #Disposition -discharge back to her group home -follow up with ACT team    I certify that inpatient services furnished can reasonably be expected to improve the patient's condition.   Orson Slick, MD 05/15/2018, 9:55 AM

## 2018-05-14 NOTE — Progress Notes (Signed)
Assisting patient from bathroom after taking a shower back to bed.  When got to foot of bed patient just wen rigid, and eyes rolled back in her head.  Did not respond to verbal stimuli.  Patient eased to floor by this Probation officer.  Rapid response called.  Vital signs and cbg performed, see flow sheet. Rapid response team arrived, brief description of events given.  Patient placed on O2.  Dr. Kendal Hymen notified.  Patient lifted to stretcher and transported to the ED.  Patient discharged from behavioral medicine. Called patients mother, no answer, voicemail left for her to return call.

## 2018-05-14 NOTE — ED Notes (Signed)
Pt given sandwich tray by MD with orange juice to drink. Drink had to be placed in cup with lid and straw due to shaking. When pt attempts to move extremities pt begins to shake vigorously. When pt relaxes extremity pt shaking stops

## 2018-05-14 NOTE — Progress Notes (Signed)
Responded to Rapid in room 320.  Placed patient on 2lpm [place].  Patient oxygen saturations 100%.  Patient was transferred to ED.
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
## 2018-05-14 NOTE — Discharge Summary (Signed)
Physician Discharge Summary Note  Patient:  Gloria Perez is an 57 y.o., female MRN:  [id number] DOB:  Jun 22, 1961 Patient phone:  [phone] (home)          Patient address:   [place] [address],            Total Time spent with patient: 35 min  Date of Admission:  05/02/2018 Date of Discharge: 05/14/18  Reason for Admission:   Per H & P note- pt presented to ED and admitted under IVC, Per report pt with complaints of suicidal ideation and possible worsening psychosis. Her group home reports that her behavior has been different for the last couple weeks more paranoid and withdrawn. Patient was not able to give much history yesterday but was endorsing suicidal ideation. Per the patient's [facility] ACT Team [phone]), the last two weeks the patient's behaviors have changed. She's easily agitated and irritable. She believe people are stealing from her and an increase in paranoia. Patient have been in the same [facility] for approximately two years and her current behaviors are new for her  On evaluation  she appears more physically unwell. Her blood pressure is low and she is complaining of feeling dizzy. Denies acute suicidal intent. Vague about hallucinations, AH      Principal Problem: <principal problem not specified> Discharge Diagnoses:     Patient Active Problem List   Diagnosis Date Noted  . Tobacco use disorder [F17.200] 05/07/2018  . Undifferentiated schizophrenia ([place]) [F20.3] 04/05/2018    Past Psychiatric History: Patient has a long history of schizophrenia and has had prior hospitalizations. Past history of suicide attempts distant. Chronically managed on Prolixin Decanoate usually pretty stable.   Past Medical History:      Past Medical History:  Diagnosis Date  . Abnormal thyroid blood test   . Acute left-sided low back pain without sciatica   . Asthma   . B12 deficiency   . CHF (congestive  heart failure) ([place])   . Chronic pain   . Contusion of lower back   . COPD (chronic obstructive pulmonary disease) ([place])   . Falling episodes   . GERD (gastroesophageal reflux disease)   . Hepatitis   . Hypertension   . Nonischemic cardiomyopathy ([place])   . Physical debility   . Pre-diabetes   . Schizophrenia ([place])   . Severe obesity (BMI 35.0-35.9 with comorbidity) ([place])   . Vitamin D deficiency   . Weight loss          Past Surgical History:  Procedure Laterality Date  . CESAREAN SECTION    . COLONOSCOPY    . COLONOSCOPY WITH PROPOFOL N/A 02/22/2018   Procedure: COLONOSCOPY WITH PROPOFOL;  Surgeon: Toledo, Benay Pike, MD;  Location: [facility] ENDOSCOPY;  Service: Gastroenterology;  Laterality: N/A;  . Right arm surgery    . TUBAL LIGATION     Family History:       Family History  Problem Relation Age of Onset  . Diabetes Mother    Family Psychiatric  History: Patient denies knowing of any   Social History:  Social History      Substance and Sexual Activity  Alcohol Use Not Currently     Social History      Substance and Sexual Activity  Drug Use Not Currently    Social History        Socioeconomic History  . Marital status: Widowed    Spouse name: Not on file  .  Number of children: Not on file  . Years of education: Not on file  . Highest education level: Not on file  Occupational History  . Not on file  Social Needs  . Financial resource strain: Not on file  . Food insecurity:    Worry: Not on file    Inability: Not on file  . Transportation needs:    Medical: Not on file    Non-medical: Not on file  Tobacco Use  . Smoking status: Current Every Day Smoker    Packs/day: 0.50    Years: 35.00    Pack years: 17.50    Types: Cigarettes  . Smokeless tobacco: Never Used  Substance and Sexual Activity  . Alcohol use: Not Currently  . Drug use: Not Currently  . Sexual activity: Not on file  Lifestyle  .  Physical activity:    Days per week: Not on file    Minutes per session: Not on file  . Stress: Not on file  Relationships  . Social connections:    Talks on phone: Not on file    Gets together: Not on file    Attends religious service: Not on file    Active member of club or organization: Not on file    Attends meetings of clubs or organizations: Not on file    Relationship status: Not on file  Other Topics Concern  . Not on file  Social History Narrative  . Not on file    Hospital Course:   Pt presented with psychosis, SI as above. Collateral obtained. Prolixin continued, prolixin dec restarted- received50 mgon 05/08/18.  Cymbalta and wellbutrin continued. Pt no longer suicidal, but remained psychotic, anxious, labile.On 05/14/18   pt had AMS, with pt stiff with possible seizure, rapid response called, pt transferred./discharged to ED for medical clearance.  Pt most likely will be readmitted to psych unit when medically cleared.   Physical Findings: AIMS:  , ,  ,  ,    CIWA:    COWS:     Musculoskeletal: Strength & Muscle Tone: within normal limits Gait & Station: normal Patient leans:   Psychiatric Specialty Exam: Physical Exam  Nursing note and vitals reviewed.   ROS  Blood pressure 125/83, pulse 98, temperature 97.8 F (36.6 C), temperature source Oral, resp. rate 16, height 5' (1.524 m), weight 65.8 kg (145 lb), SpO2 100 %.Body mass index is 28.32 kg/m.  General Appearance:Casual, walking with walker  Eye Contact: Good  Speech: Clear and Coherent  Volume: Normal  Mood: Euthymic  Affect: Flat  Thought Process: Goal Directed and Descriptions of Associations: Intact  Orientation: Full (Time, Place, and Person)  Thought Content: WDL  Suicidal Thoughts: No, denies  Homicidal Thoughts: No  Memory: Immediate; Fair Recent; Fair Remote; Fair  Judgement: Poor  Insight: Shallow  Psychomotor Activity: Decreased  Concentration:  Concentration: Poorand Attention Span: Poor  Recall: Poor  Fund of Knowledge: Fair  Language: Fair  Akathisia: No  Handed: Right  AIMS (if indicated):   Assets: Communication Skills Desire for Improvement Financial Resources/Insurance Housing Physical Health Resilience Social Support  ADL's: Intact  Cognition: WNL  Sleep:Number of Hours:          Has this patient used any form of tobacco in the last 30 days? (Cigarettes, Smokeless Tobacco, Cigars, and/or Pipes) Yes, No  Blood Alcohol level:  RecentLabs       Lab Results  Component Value Date   ETH <10 05/02/2018   ETH <10 04/05/2018  Metabolic Disorder Labs:  RecentLabs       Lab Results  Component Value Date   HGBA1C 4.8 05/04/2018   MPG 91.06 05/04/2018   MPG 88.19 05/02/2018     RecentLabs  No results found for: PROLACTIN   RecentLabs       Lab Results  Component Value Date   CHOL 189 05/04/2018   TRIG 136 05/04/2018   HDL 79 05/04/2018   CHOLHDL 2.4 05/04/2018   VLDL 27 05/04/2018   LDLCALC 83 05/04/2018   LDLCALC 58 08/17/2017      See Psychiatric Specialty Exam and Suicide Risk Assessment completed by Attending Physician prior to discharge.  Discharge destination:  ED  Is patient on multiple antipsychotic therapies at discharge:  No   Has Patient had three or more failed trials of antipsychotic monotherapy by history:  No  Recommended Plan for Multiple Antipsychotic Therapies: NA       Allergies as of 05/02/2018      Reactions   Lisinopril    Metformin And Related              Medication List       ASK your doctor about these medications     Indication  benztropine 0.5 MG tablet Commonly known as:  COGENTIN Take 0.5 mg by mouth 2 (two) times daily.    buPROPion 150 MG 12 hr tablet Commonly known as:  WELLBUTRIN SR Take 150 mg by mouth 2 (two) times daily.    Cholecalciferol 1000 units tablet Take 1,000 Units by  mouth daily.    DULoxetine 30 MG capsule Commonly known as:  CYMBALTA Take 30 mg by mouth 2 (two) times daily.    hydrOXYzine 25 MG tablet Commonly known as:  ATARAX/VISTARIL Take 25 mg by mouth every evening.    lamoTRIgine 25 MG tablet Commonly known as:  LAMICTAL Take 25 mg by mouth 2 (two) times daily.    lisinopril 5 MG tablet Commonly known as:  PRINIVIL,ZESTRIL Take 5 mg by mouth daily.    omeprazole 20 MG capsule Commonly known as:  PRILOSEC Take 20 mg by mouth daily.    tiotropium 18 MCG inhalation capsule Commonly known as:  SPIRIVA Place 18 mcg into inhaler and inhale daily.    traZODone 50 MG tablet Commonly known as:  DESYREL Take 50 mg by mouth at bedtime.        Follow-up recommendations:  Will be  made  Comments:

## 2018-05-14 NOTE — ED Notes (Signed)
Pt returned from CT °

## 2018-05-14 NOTE — ED Notes (Signed)
Pts bed moved beside the toilet in the room and pt assisted to her feet and pt was able to walk over and sit down on the toilet and use the bathroom. The pt was then assisted back to her bed and bed moved back and both rails were up in place.

## 2018-05-14 NOTE — ED Notes (Addendum)
RN assisted pt to eat sandwich due to pt shaking

## 2018-05-14 NOTE — Progress Notes (Signed)
CH was rounding, called on patient and invited into the room, pastoral presence ensused, patient requested prayer , along with concerns regarding her health and potential side effects from the medication. Patient reassurance given, proceeded with prayer, visit concluded with salutations of many blessings, peace and healing conferred upon the patient.

## 2018-05-15 DIAGNOSIS — F203 Undifferentiated schizophrenia: Principal | ICD-10-CM

## 2018-05-15 LAB — GLUCOSE, CAPILLARY
GLUCOSE-CAPILLARY: 81 mg/dL (ref 70–99)
GLUCOSE-CAPILLARY: 66 mg/dL — AB (ref 70–99)
GLUCOSE-CAPILLARY: 74 mg/dL (ref 70–99)
GLUCOSE-CAPILLARY: 89 mg/dL (ref 70–99)
Glucose-Capillary: 89 mg/dL (ref 70–99)

## 2018-05-15 NOTE — Tx Team (Signed)
Interdisciplinary Treatment and Diagnostic Plan Update  05/15/2018 Time of Session: 11:00am Gloria Perez MRN: [id number]  Principal Diagnosis: Undifferentiated schizophrenia [facility])  Secondary Diagnoses: Principal Problem:   Undifferentiated schizophrenia ([place]) Active Problems:   Tobacco use disorder   Current Medications:  Current Facility-Administered Medications  Medication Dose Route Frequency Provider Last Rate Last Dose  . acetaminophen (TYLENOL) tablet 650 mg  650 mg Oral Q6H PRN Lenward Chancellor, MD   650 mg at 05/15/18 0753  . alum & mag hydroxide-simeth (MAALOX/MYLANTA) 200-200-20 MG/5ML suspension 30 mL  30 mL Oral Q4H PRN Lenward Chancellor, MD      . cholecalciferol (VITAMIN D) tablet 1,000 Units  1,000 Units Oral Daily Lenward Chancellor, MD   1,000 Units at 05/15/18 0753  . DULoxetine (CYMBALTA) DR capsule 60 mg  60 mg Oral Daily Pucilowska, Jolanta B, MD   60 mg at 05/15/18 0753  . [START ON 05/28/2018] fluPHENAZine decanoate (PROLIXIN) injection 20 mg  20 mg Intramuscular Q21 days Pucilowska, Jolanta B, MD      . magnesium hydroxide (MILK OF MAGNESIA) suspension 30 mL  30 mL Oral Daily PRN Lenward Chancellor, MD      . pantoprazole (PROTONIX) EC tablet 40 mg  40 mg Oral Daily Lenward Chancellor, MD   40 mg at 05/15/18 0755  . tiotropium (SPIRIVA) inhalation capsule 18 mcg  18 mcg Inhalation Daily Lenward Chancellor, MD   18 mcg at 05/15/18 0756  . traZODone (DESYREL) tablet 50 mg  50 mg Oral QHS PRN Lenward Chancellor, MD   50 mg at 05/14/18 2102  . traZODone (DESYREL) tablet 50 mg  50 mg Oral QHS Lenward Chancellor, MD       PTA Medications: Medications Prior to Admission  Medication Sig Dispense Refill Last Dose  . benztropine (COGENTIN) 0.5 MG tablet Take 0.5 mg by mouth 2 (two) times daily.   05/02/2018 at 0800  . buPROPion (WELLBUTRIN SR) 150 MG 12 hr tablet Take 150 mg by mouth 2 (two) times daily.   05/02/2018 at 0800  . Cholecalciferol 1000 units tablet Take 1,000  Units by mouth daily.    05/02/2018 at 0800  . DULoxetine (CYMBALTA) 30 MG capsule Take 30 mg by mouth 2 (two) times daily.    05/02/2018 at 0800  . hydrOXYzine (ATARAX/VISTARIL) 25 MG tablet Take 25 mg by mouth every evening.    05/01/2018 at 2000  . lamoTRIgine (LAMICTAL) 25 MG tablet Take 25 mg by mouth 2 (two) times daily.   05/02/2018 at 0800  . lisinopril (PRINIVIL,ZESTRIL) 5 MG tablet Take 5 mg by mouth daily.   05/02/2018 at 0800  . omeprazole (PRILOSEC) 20 MG capsule Take 20 mg by mouth daily.   05/02/2018 at 0800  . tiotropium (SPIRIVA) 18 MCG inhalation capsule Place 18 mcg into inhaler and inhale daily.   02/22/2018 at Unknown time  . traZODone (DESYREL) 50 MG tablet Take 50 mg by mouth at bedtime.   05/01/2018 at 2000    Patient Stressors: Health problems  Patient Strengths: Motivation for treatment/growth  Treatment Modalities: Medication Management, Group therapy, Case management,  1 to 1 session with clinician, Psychoeducation, Recreational therapy.   Physician Treatment Plan for Primary Diagnosis: Undifferentiated schizophrenia ([name]) Long Term Goal(s): Improvement in symptoms so as ready for discharge NA   Short Term Goals: Ability to identify changes in lifestyle to reduce recurrence of condition will improve Ability to verbalize feelings will improve Ability to disclose and discuss suicidal ideas Ability to demonstrate self-control will improve  Ability to identify and develop effective coping behaviors will improve Ability to maintain clinical measurements within normal limits will improve Compliance with prescribed medications will improve Ability to identify triggers associated with substance abuse/mental health issues will improve NA  Medication Management: Evaluate patient's response, side effects, and tolerance of medication regimen.  Therapeutic Interventions: 1 to 1 sessions, Unit Group sessions and Medication administration.  Evaluation of Outcomes:  Progressing  Physician Treatment Plan for Secondary Diagnosis: Principal Problem:   Undifferentiated schizophrenia ([place]) Active Problems:   Tobacco use disorder  Long Term Goal(s): Improvement in symptoms so as ready for discharge NA   Short Term Goals: Ability to identify changes in lifestyle to reduce recurrence of condition will improve Ability to verbalize feelings will improve Ability to disclose and discuss suicidal ideas Ability to demonstrate self-control will improve Ability to identify and develop effective coping behaviors will improve Ability to maintain clinical measurements within normal limits will improve Compliance with prescribed medications will improve Ability to identify triggers associated with substance abuse/mental health issues will improve NA     Medication Management: Evaluate patient's response, side effects, and tolerance of medication regimen.  Therapeutic Interventions: 1 to 1 sessions, Unit Group sessions and Medication administration.  Evaluation of Outcomes: Progressing   RN Treatment Plan for Primary Diagnosis: Undifferentiated schizophrenia ([name]) Long Term Goal(s): Knowledge of disease and therapeutic regimen to maintain health will improve  Short Term Goals: Ability to identify and develop effective coping behaviors will improve and Compliance with prescribed medications will improve  Medication Management: RN will administer medications as ordered by provider, will assess and evaluate patient's response and provide education to patient for prescribed medication. RN will report any adverse and/or side effects to prescribing provider.  Therapeutic Interventions: 1 on 1 counseling sessions, Psychoeducation, Medication administration, Evaluate responses to treatment, Monitor vital signs and CBGs as ordered, Perform/monitor CIWA, COWS, AIMS and Fall Risk screenings as ordered, Perform wound care treatments as ordered.  Evaluation of Outcomes:  Progressing   LCSW Treatment Plan for Primary Diagnosis: Undifferentiated schizophrenia ([place]) Long Term Goal(s): Safe transition to appropriate next level of care at discharge, Engage patient in therapeutic group addressing interpersonal concerns.  Short Term Goals: Engage patient in aftercare planning with referrals and resources, Identify triggers associated with mental health/substance abuse issues and Increase skills for wellness and recovery  Therapeutic Interventions: Assess for all discharge needs, 1 to 1 time with Social worker, Explore available resources and support systems, Assess for adequacy in community support network, Educate family and significant other(s) on suicide prevention, Complete Psychosocial Assessment, Interpersonal group therapy.  Evaluation of Outcomes: Progressing   Progress in Treatment: Attending groups: No. Participating in groups: No. Taking medication as prescribed: Yes. Toleration medication: Yes. Family/Significant other contact made: No, will contact:  Pt refused Patient understands diagnosis: Yes. Discussing patient identified problems/goals with staff: Yes. Medical problems stabilized or resolved: Yes Denies suicidal/homicidal ideation: Yes. Issues/concerns per patient self-inventory: No. Other:    New problem(s) identified: No, Describe:     New Short Term/Long Term Goal(s):  Patient Goals:  Continue to feel better  Discharge Plan or Barriers: discharge back to group home once stable medically and mentally  Reason for Continuation of Hospitalization: Delusions  Hallucinations Medical Issues Medication stabilization  Estimated Length of Stay:3-5 days  Attendees: Patient:Gloria Perez 05/15/2018 4:43 PM  Physician: Orson Slick, MD 05/15/2018 4:43 PM  Nursing:  05/15/2018 4:43 PM  RN Care Manager: 05/15/2018 4:43 PM  Social Worker: Dossie Arbour, LCSW 05/15/2018 4:43  PM  Recreational Therapist:  05/15/2018 4:43 PM  Other:  05/15/2018 4:43 PM   Other:  05/15/2018 4:43 PM  Other: 05/15/2018 4:43 PM    Scribe for Treatment Team: August Saucer, LCSW 05/15/2018 4:43 PM

## 2018-05-15 NOTE — Progress Notes (Signed)
Recreation Therapy Notes  INPATIENT RECREATION THERAPY ASSESSMENT  Patient Details Name: Gloria Perez MRN: [id number] DOB: 07/28/1961 Today's Date: 05/15/2018       Information Obtained From: Patient  Able to Participate in Assessment/Interview: Yes  Patient Presentation: Responsive, Confused  Reason for Admission (Per Patient): Active Symptoms  Patient Stressors:    Coping Skills:   Talk  Leisure Interests (2+):  Exercise - Walking  Frequency of Recreation/Participation:    Awareness of Community Resources:  Yes  Community Resources:  [employer]  Current Use:    If no, Barriers?:    Expressed Interest in [employer] Information:    [place] of Residence:  [employer]  Patient Main Form of Transportation: Walk  Patient Strengths:  I do not know  Patient Identified Areas of Improvement:  I do not know  Patient Goal for Hospitalization:  I do not know  Current SI (including self-harm):     Current HI:     Current AVH:    Staff Intervention Plan: Group Attendance, Collaborate with Interdisciplinary Treatment Team  Consent to Intern Participation: N/A  Gloria Perez 05/15/2018, 1:57 PM

## 2018-05-15 NOTE — Progress Notes (Signed)
0800  Patient is confused and patient is unable to feed herself because of shaking.Sitter helped with feeding.  0900  Patient states "I cannot do anything".Patient is scared to walk.Sitter at bedside.  35  Sitter assisted with ADLs.Appropriate with sitter.  1100  Patient seems less confused now.Ambulates with walker.Sitter at bedside.  1200  Patient fed herself.Safety sitter with patient.  1300  Patient appropriate behavior with 1:1.

## 2018-05-15 NOTE — Progress Notes (Signed)
Recreation Therapy Notes  Date: 05/15/2018  Time: 9:30 am   Location: Craft Room   Behavioral response: N/A   Intervention Topic:  Problem Solving  Discussion/Intervention: Patient did not attend group.   Clinical Observations/Feedback:  Patient did not attend group.   Madix Blowe LRT/CTRS        Tierria Watson 05/15/2018 10:21 AM

## 2018-05-15 NOTE — Plan of Care (Signed)
Patients confusion is more clear.Ambulates steady with the walker.Safety sitter with patient.

## 2018-05-15 NOTE — Progress Notes (Signed)
Patient continues to be on 1:1 no distress noted, safety sitter at bedside , 35minutes safety checks maintained will continue to monitor.

## 2018-05-15 NOTE — Progress Notes (Signed)
1400  Patient resting with eyes closed.Safety sitter at bedside.  1500  Patient resting.Sitter at bedside.  1600  Patient appropriate behavior with 1:1.  1700  Patient fed herself.Safety sitter with patient.  1800  Assisted patient to get out of chair.sitter with patient.

## 2018-05-16 LAB — GLUCOSE, CAPILLARY
GLUCOSE-CAPILLARY: 80 mg/dL (ref 70–99)
Glucose-Capillary: 74 mg/dL (ref 70–99)
Glucose-Capillary: 147 mg/dL — ABNORMAL HIGH (ref 70–99)

## 2018-05-16 MED ORDER — QUETIAPINE FUMARATE 25 MG PO TABS
50.0000 mg | ORAL_TABLET | Freq: Every day | ORAL | Status: DC
  Administered 2018-05-16 – 2018-05-17 (×2): 50 mg via ORAL
  Filled 2018-05-16 (×2): qty 2

## 2018-05-16 MED ORDER — DIPHENHYDRAMINE HCL 25 MG PO CAPS
25.0000 mg | ORAL_CAPSULE | Freq: Three times a day (TID) | ORAL | Status: DC
  Administered 2018-05-16 – 2018-05-18 (×6): 25 mg via ORAL
  Filled 2018-05-16 (×6): qty 1

## 2018-05-16 NOTE — [facility] Group Notes (Signed)
CSW Group Therapy Note  05/16/2018  Time:  0900  Type of Therapy and Topic: Group Therapy: Goals Group: SMART Goals    Participation Level:  Did Not Attend    Description of Group:   The purpose of a daily goals group is to assist and guide patients in setting recovery/wellness-related goals. The objective is to set goals as they relate to the crisis in which they were admitted. Patients will be using SMART goal modalities to set measurable goals. Characteristics of realistic goals will be discussed and patients will be assisted in setting and processing how one will reach their goal. Facilitator will also assist patients in applying interventions and coping skills learned in psycho-education groups to the SMART goal and process how one will achieve defined goal.    Therapeutic Goals:  -Patients will develop and document one goal related to or their crisis in which brought them into treatment.  -Patients will be guided by LCSW using SMART goal setting modality in how to set a measurable, attainable, realistic and time sensitive goal.  -Patients will process barriers in reaching goal.  -Patients will process interventions in how to overcome and successful in reaching goal.    Patient's Goal:  Pt was invited to attend group but chose not to attend. CSW will continue to encourage pt to attend group throughout their admission.    Therapeutic Modalities:  Motivational Interviewing  Cognitive Behavioral Therapy  Crisis Intervention Model  SMART goals setting  Alden Hipp, MSW, LCSW Clinical Social Worker 05/16/2018 10:02 AM

## 2018-05-16 NOTE — Progress Notes (Signed)
D:Pt denies SI/HI/AVH. Pt. During assessments becomes very fidgety with her lips trembling and presents anxious. Pt. During this time given support and encouragement as well as offered PRN medications for anxieties. Pt. Declines PRN medications for anxieties. Pt. Reports some anxiousness, but is very vague. Pt. During assessments forwards little to TW. Safety sitter 1:1 reports that during conversations with this Probation officer and other staff the patient begins to twitch and present with tremors, but when alone present with 1:1 does not shack and is able to sit and eat snacks in her bed calmly. Pt. presents during our conversations with thought blocking behaviors.    A: Q x 15 minute observation checks were completed for safety. Patient was provided with education as well as education on high fall risk safety interventions. Patient was given scheduled medications. Patient  was encourage to attend groups, participate in unit activities and continue with plan of care. Pt. Chart and plans of care reviewed.   R:Patient is complaint with medication and unit procedures, but does not attend group or snacks. Pt. Blood sugars monitored per MD orders for safety. 1:1 maintained for safety per MD orders for safety.             Precautionary checks every 15 minutes for safety maintained, room free of safety hazards, patient sustains no injury or falls during this shift.

## 2018-05-16 NOTE — Progress Notes (Signed)
1300: Patient laying down in bed; eyes open; looking up at ceiling; no distress noted. Nurse Tech within arms reach. Nurse will continue to monitor.  1400: Patient laying down in bed no shakiness noted; eyes open.Patient  Sat up in bed when nurse showed medication and hands began to Shake. Nurse Tech within arms reach. Nurse will continue to monitor.

## 2018-05-16 NOTE — Progress Notes (Signed)
[facility] : [facility] MD Progress Note  05/16/2018 2:14 PM Gloria Perez  MRN:  [id number]  Subjective:    Gloria Perez is very disorganized in her thinking, delusional and paranoid. She seems forgetful and unable to participate in discharge believing that she could stay with her mother.  She is talking to invisible person in the room. She has 1:1 sitter due to confusion and fall risk even though she was evaluated by PTwho did nor recommend even a walker. She has periods of violent shakes that were not resolved with Cogentin 2 mg or Symmetrel. We will offer Benadryl today. At times, she has to be fed due to tremors.  Principal Problem: Undifferentiated schizophrenia ([place]) Diagnosis:   Patient Active Problem List   Diagnosis Date Noted  . Undifferentiated schizophrenia ([place]) [F20.3] 04/05/2018    Priority: High  . Tobacco use disorder [F17.200] 05/07/2018   Total Time spent with patient: 20 minutes  Past Psychiatric History: schizophrenia  Past Medical History:  Past Medical History:  Diagnosis Date  . Abnormal thyroid blood test   . Acute left-sided low back pain without sciatica   . Asthma   . B12 deficiency   . CHF (congestive heart failure) ([place])   . Chronic pain   . Contusion of lower back   . COPD (chronic obstructive pulmonary disease) ([place])   . Falling episodes   . GERD (gastroesophageal reflux disease)   . Hepatitis   . Hypertension   . Nonischemic cardiomyopathy ([place])   . Physical debility   . Pre-diabetes   . Schizophrenia ([place])   . Severe obesity (BMI 35.0-35.9 with comorbidity) ([place])   . Vitamin D deficiency   . Weight loss     Past Surgical History:  Procedure Laterality Date  . CESAREAN SECTION    . COLONOSCOPY    . COLONOSCOPY WITH PROPOFOL N/A 02/22/2018   Procedure: COLONOSCOPY WITH PROPOFOL;  Surgeon: Toledo, Benay Pike, MD;  Location: [facility] ENDOSCOPY;  Service: Gastroenterology;  Laterality: N/A;  . Right arm surgery    . TUBAL LIGATION     Family History:  Family  History  Problem Relation Age of Onset  . Diabetes Mother    Family Psychiatric  History: none Social History:  Social History   Substance and Sexual Activity  Alcohol Use Not Currently     Social History   Substance and Sexual Activity  Drug Use Not Currently    Social History   Socioeconomic History  . Marital status: Widowed    Spouse Perez: Not on file  . Number of children: Not on file  . Years of education: Not on file  . Highest education level: Not on file  Occupational History  . Not on file  Social Needs  . Financial resource strain: Not on file  . Food insecurity:    Worry: Not on file    Inability: Not on file  . Transportation needs:    Medical: Not on file    Non-medical: Not on file  Tobacco Use  . Smoking status: Current Every Day Smoker    Packs/day: 0.50    Years: 35.00    Pack years: 17.50    Types: Cigarettes  . Smokeless tobacco: Never Used  Substance and Sexual Activity  . Alcohol use: Not Currently  . Drug use: Not Currently  . Sexual activity: Not on file  Lifestyle  . Physical activity:    Days per week: Not on file    Minutes per session: Not on file  .  Stress: Not on file  Relationships  . Social connections:    Talks on phone: Not on file    Gets together: Not on file    Attends religious service: Not on file    Active member of club or organization: Not on file    Attends meetings of clubs or organizations: Not on file    Relationship status: Not on file  Other Topics Concern  . Not on file  Social History Narrative  . Not on file   Additional Social History:    History of alcohol / drug use?: No history of alcohol / drug abuse                    Sleep: Fair  Appetite:  Poor  Current Medications: Current Facility-Administered Medications  Medication Dose Route Frequency Provider Last Rate Last Dose  . acetaminophen (TYLENOL) tablet 650 mg  650 mg Oral Q6H PRN Lenward Chancellor, MD   650 mg at 05/15/18 0753   . alum & mag hydroxide-simeth (MAALOX/MYLANTA) 200-200-20 MG/5ML suspension 30 mL  30 mL Oral Q4H PRN Lenward Chancellor, MD      . cholecalciferol (VITAMIN D) tablet 1,000 Units  1,000 Units Oral Daily Lenward Chancellor, MD   1,000 Units at 05/16/18 [phone]  . DULoxetine (CYMBALTA) DR capsule 60 mg  60 mg Oral Daily Amily Depp B, MD   60 mg at 05/16/18 6160  . [START ON 05/28/2018] fluPHENAZine decanoate (PROLIXIN) injection 20 mg  20 mg Intramuscular Q21 days Anikka Marsan B, MD      . magnesium hydroxide (MILK OF MAGNESIA) suspension 30 mL  30 mL Oral Daily PRN Lenward Chancellor, MD      . pantoprazole (PROTONIX) EC tablet 40 mg  40 mg Oral Daily Lenward Chancellor, MD   40 mg at 05/16/18 7371  . tiotropium (SPIRIVA) inhalation capsule 18 mcg  18 mcg Inhalation Daily Lenward Chancellor, MD   18 mcg at 05/16/18 0818  . traZODone (DESYREL) tablet 50 mg  50 mg Oral QHS PRN Lenward Chancellor, MD   50 mg at 05/14/18 2102  . traZODone (DESYREL) tablet 50 mg  50 mg Oral QHS Lenward Chancellor, MD   50 mg at 05/15/18 2120    Lab Results:  Results for orders placed or performed during the hospital encounter of 05/14/18 (from the past 48 hour(s))  Glucose, capillary     Status: None   Collection Time: 05/14/18  2:36 PM  Result Value Ref Range   Glucose-Capillary 81 70 - 99 mg/dL  Glucose, capillary     Status: None   Collection Time: 05/14/18  2:38 PM  Result Value Ref Range   Glucose-Capillary 89 70 - 99 mg/dL  Glucose, capillary     Status: Abnormal   Collection Time: 05/14/18  3:51 PM  Result Value Ref Range   Glucose-Capillary 66 (L) 70 - 99 mg/dL  Glucose, capillary     Status: None   Collection Time: 05/14/18  4:12 PM  Result Value Ref Range   Glucose-Capillary 74 70 - 99 mg/dL  Glucose, capillary     Status: None   Collection Time: 05/15/18 11:21 AM  Result Value Ref Range   Glucose-Capillary 89 70 - 99 mg/dL   Comment 1 Notify RN   Glucose, capillary     Status: None    Collection Time: 05/15/18  9:28 PM  Result Value Ref Range   Glucose-Capillary 80 70 - 99 mg/dL   Comment 1 Notify RN  Glucose, capillary     Status: None   Collection Time: 05/16/18  7:06 AM  Result Value Ref Range   Glucose-Capillary 74 70 - 99 mg/dL   Comment 1 Notify RN   Glucose, capillary     Status: Abnormal   Collection Time: 05/16/18  9:55 AM  Result Value Ref Range   Glucose-Capillary 147 (H) 70 - 99 mg/dL   Comment 1 Notify RN     Blood Alcohol level:  Lab Results  Component Value Date   ETH <10 05/02/2018   ETH <10 32/20/2542    Metabolic Disorder Labs: Lab Results  Component Value Date   HGBA1C 4.8 05/04/2018   MPG 91.06 05/04/2018   MPG 88.19 05/02/2018   No results found for: PROLACTIN Lab Results  Component Value Date   CHOL 189 05/04/2018   TRIG 136 05/04/2018   HDL 79 05/04/2018   CHOLHDL 2.4 05/04/2018   VLDL 27 05/04/2018   LDLCALC 83 05/04/2018   LDLCALC 58 08/17/2017    Physical Findings: AIMS: Facial and Oral Movements Muscles of Facial Expression: Mild Lips and Perioral Area: Minimal Jaw: None, normal Tongue: Minimal,Extremity Movements Upper (arms, wrists, hands, fingers): Moderate Lower (legs, knees, ankles, toes): Mild, Trunk Movements Neck, shoulders, hips: None, normal, Overall Severity Severity of abnormal movements (highest score from questions above): None, normal Incapacitation due to abnormal movements: Mild Patient's awareness of abnormal movements (rate only patient's report): Aware, mild distress, Dental Status Current problems with teeth and/or dentures?: Yes(no teeth) Does patient usually wear dentures?: Yes  CIWA:    COWS:     Musculoskeletal: Strength & Muscle Tone: within normal limits Gait & Station: normal Patient leans: N/A  Psychiatric Specialty Exam: Physical Exam  Nursing note and vitals reviewed. Psychiatric: Her affect is inappropriate. Her speech is delayed and tangential. She is actively  hallucinating. Thought content is delusional. Cognition and memory are impaired. She expresses impulsivity.    Review of Systems  Musculoskeletal: Positive for falls.  Neurological: Positive for tremors.  Psychiatric/Behavioral: Positive for hallucinations.  All other systems reviewed and are negative.   Blood pressure (!) 153/88, pulse 96, temperature 97.9 F (36.6 C), temperature source Oral, resp. rate 18, height 5' (1.524 m), weight 65.8 kg (145 lb), SpO2 100 %.Body mass index is 28.32 kg/m.  General Appearance: Disheveled  Eye Contact:  Good  Speech:  Slow  Volume:  Decreased  Mood:  Anxious  Affect:  Labile  Thought Process:  Irrelevant and Descriptions of Associations: Loose  Orientation:  Other:  person only  Thought Content:  Delusions, Hallucinations: Auditory and Paranoid Ideation  Suicidal Thoughts:  No  Homicidal Thoughts:  No  Memory:  Immediate;   Poor Recent;   Poor Remote;   Poor  Judgement:  Poor  Insight:  Lacking  Psychomotor Activity:  Increased  Concentration:  Concentration: Poor and Attention Span: Poor  Recall:  Poor  Fund of Knowledge:  Poor  Language:  Poor  Akathisia:  No  Handed:  Right  AIMS (if indicated):     Assets:  Communication Skills Desire for Improvement Financial Resources/Insurance Housing Physical Health Resilience Social Support  ADL's:  Intact  Cognition:  WNL  Sleep:  Number of Hours: 8     Treatment Plan Summary: Daily contact with patient to assess and evaluate symptoms and progress in treatment and Medication management   Gloria Perez is a 57 year old female with a history of schizophrenia admitted for psychotic break who returns after brief visit in the  ER for episode of hypoglycemia and seizure.  #Mood/psychosis -continue Prolixin 18.5 mg every 3 weeks -patient was given Prolixin 50 mg IM on 7/1 -discontinue Wellbutrin 150 mg daily -discontinue Cymbalta 60 mg daily --start Seroquel 50 mg  nightly  #EPS -discontinue Cogentin 2 mg daily -discontinue Symmetrel 100 mg BID -start Benadryl 25 mg TID   #Insomnia slept 8 hours --Trazodone 100 mg nightly   #Asthma -continue Spiriva  #UTI, resolved -completed a course of Keflex  #Hypoglycemia, resolved  #Seizure -discontinue Lamictal 25 mg BID started in ER -head CT scan negative -1:1 sitter  #Labs completed  #Smoking cessation -nicotine patch is available  #Disposition -discharge back to her group home -follow up with ACT team     Orson Slick, MD 05/16/2018, 2:14 PM

## 2018-05-16 NOTE — [facility] Group Notes (Signed)
05/16/2018 1PM  Type of Therapy/Topic:  Group Therapy:  Feelings about Diagnosis  Participation Level:  Minimal   Description of Group:   This group will allow patients to explore their thoughts and feelings about diagnoses they have received. Patients will be guided to explore their level of understanding and acceptance of these diagnoses. Facilitator will encourage patients to process their thoughts and feelings about the reactions of others to their diagnosis and will guide patients in identifying ways to discuss their diagnosis with significant others in their lives. This group will be process-oriented, with patients participating in exploration of their own experiences, giving and receiving support, and processing challenge from other group members.   Therapeutic Goals: 1. Patient will demonstrate understanding of diagnosis as evidenced by identifying two or more symptoms of the disorder 2. Patient will be able to express two feelings regarding the diagnosis 3. Patient will demonstrate their ability to communicate their needs through discussion and/or role play  Summary of Patient Progress: Actively and appropriately engaged in the group. Patient practiced active listening when interacting with the facilitator and other group members. Patient reports "I'm working on my temper and I'm working on myself."  Patient is still in the process of obtaining treatment goals.       Therapeutic Modalities:   Cognitive Behavioral Therapy Brief Therapy Feelings Identification    Darin Engels, LCSW 05/16/2018 2:51 PM

## 2018-05-16 NOTE — Plan of Care (Addendum)
Patient is alert and oriented, denies SI, HI and AVH. Patient complains of shakiness; and somatic hallucinations. Patient is on a 1:1  for safety. Patient is not in any distress, attends some of the meetings, but mostly lays in the bed with sitter within arms reach. Patient blood glucose is  being maintained.  Patient attempts to verbalize feelings; confusion and disorganized thinking. Nurse will continue to monitor with safety sitter.  Problem: Education: Goal: Mental status will improve Outcome: Progressing   Problem: Coping: Goal: Ability to verbalize frustrations and anger appropriately will improve Outcome: Progressing   Problem: Safety: Goal: Periods of time without injury will increase Outcome: Progressing

## 2018-05-16 NOTE — Progress Notes (Signed)
7 AM: Patient laying in bed eyes closed; breaths even ; no distress; safety sitter within arms reach. 8 AM: Patient eating breakfast; took medications; Nurse noticed shakiness of hands when giving medications. No shakiness observed before. Patient has no distress; Safety sitter within arms reach. 9AM: Patient laying in bed, eyes closed, breaths even and unlabored. Safety sitter within arms reach. 10AM: Patient laying in bed awake talking with safety sitter no distress noted, no shakiness noticed.  11AM: Patient sitting in the day room with safety sitter talking awaiting lunch. 12PM: Patient eating lunch in the day room; no distress noted; safety sitter within arms reach.

## 2018-05-16 NOTE — Progress Notes (Signed)
Recreation Therapy Notes  Date: 05/16/2018  Time: 9:30 am  Location: Craft Room  Behavioral response: Appropriate   Intervention Topic: Self-esteem  Discussion/Intervention:  Group content today was focused on self-esteem. Patient defined self-esteem and where it comes form. The group described reasons self-esteem is important. Individuals stated things that impact self-esteem and positive ways to improve self-esteem. The group participated in the intervention "Improving Me" where patients were able to create a collage of positive things that makes them who they are. Clinical Observations/Feedback:  Patient came to group late do to unknown reasons. She stated she did not feel well and left group early with a MHT. Somer Trotter LRT/CTRS         Caide Campi 05/16/2018 11:25 AM

## 2018-05-16 NOTE — B[facility] Group Notes (Signed)
[place] Group Notes:  (Nursing/MHT/Case Management/Adjunct)  Date:  05/16/2018  Time:  9:52 PM  Type of Therapy:  Group Therapy  Participation Level:  Did Not Attend   Barnie Mort 05/16/2018, 9:52 PM

## 2018-05-16 NOTE — Progress Notes (Signed)
1500: Patient attended group with sitter, No distress noted. Patient wanted to go into locker to get her "pocket book out". Rules explained Patient informed no locker re-entry until discharge.  1600: Patient In Dayroom with sitter; No distress noted.  1700: Patient in dayroom eating dinner, patient able to feed self without shaking of hands. Patient hands seem to shake once patient notices someone watching her. Nurse sitter within arms reach.  18:00 Patient in dayroom watching television with Nurse Tech sitter within reach; no distress.  1900: Patient in dayroom watching television with Nurse Tech sitter within arms reach; no distress.

## 2018-05-16 NOTE — Progress Notes (Signed)
1:1 Safety Sitter Note  1900 Pt. Resting in bed with 1:1 sitter present 2000 Pt. Resting in bed with 1:1 sitter present 2100 Pt. Resting in bed with 1:1 sitter present 2200 Pt. Resting in bed with 1:1 sitter present 2300 Pt. Sleeping in bed at this time with 1:1 sitter present. Respirations even and unlabored.  0000 Pt. Sleeping in bed at this time with 1:1 sitter present. Respirations even and unlabored.  0100 Pt. Sleeping in bed at this time with 1:1 sitter present. Respirations even and unlabored.  0200 Pt. Sleeping in bed at this time with 1:1 sitter present. Respirations even and unlabored.  0300 Pt. Sleeping in bed at this time with 1:1 sitter present. Respirations even and unlabored. 0400 Pt. Sleeping in bed at this time with 1:1 sitter present. Respirations even and unlabored. 0500 Pt. Sleeping in bed at this time with 1:1 sitter present. Respirations even and unlabored. 0600 Pt. Resting in bed with 1:1 sitter present 0700 Pt. Resting in bed with 1:1 sitter present  Pt. Monitored per MD orders.

## 2018-05-16 NOTE — Plan of Care (Signed)
Pt. Denies SI/HI this evening. Pt. Given education on high falls risk and to ask for help when ambulating. Pt. Educated to utilize front wheel walker given. Pt. Verbalizes high fall risk and mobility education safety.   Problem: Safety: Goal: Periods of time without injury will increase Outcome: Progressing

## 2018-05-17 LAB — GLUCOSE, CAPILLARY
GLUCOSE-CAPILLARY: 101 mg/dL — AB (ref 70–99)
Glucose-Capillary: 79 mg/dL (ref 70–99)
Glucose-Capillary: 110 mg/dL — ABNORMAL HIGH (ref 70–99)

## 2018-05-17 MED ORDER — OMEPRAZOLE 20 MG PO CPDR: 20.0000 mg | DELAYED_RELEASE_CAPSULE | Freq: Every day | ORAL | 1 refills | Status: AC

## 2018-05-17 MED ORDER — TIOTROPIUM BROMIDE MONOHYDRATE 18 MCG IN CAPS: 18.0000 ug | ORAL_CAPSULE | Freq: Every day | RESPIRATORY_TRACT | 12 refills | Status: AC

## 2018-05-17 MED ORDER — DIPHENHYDRAMINE HCL 25 MG PO CAPS: 25.0000 mg | ORAL_CAPSULE | Freq: Three times a day (TID) | ORAL | 1 refills | Status: DC

## 2018-05-17 MED ORDER — QUETIAPINE FUMARATE 50 MG PO TABS: 50.0000 mg | ORAL_TABLET | Freq: Every day | ORAL | 1 refills | Status: DC

## 2018-05-17 MED ORDER — FLUPHENAZINE DECANOATE 25 MG/ML IJ SOLN: 20.0000 mg | INTRAMUSCULAR | 1 refills | Status: DC

## 2018-05-17 MED ORDER — TRAZODONE HCL 50 MG PO TABS: 50.0000 mg | ORAL_TABLET | Freq: Every evening | ORAL | 1 refills | Status: DC | PRN

## 2018-05-17 MED ORDER — CHOLECALCIFEROL 25 MCG (1000 UT) PO TABS: 1000.0000 [IU] | ORAL_TABLET | Freq: Every day | ORAL | 1 refills | Status: AC

## 2018-05-17 NOTE — Progress Notes (Signed)
Patient alert and oriented x 3 no distress noted , sitter at bedside will continue to monitor.

## 2018-05-17 NOTE — BHH Group Notes (Signed)
LCSW Group Therapy Note  05/17/2018 1:00 pm  Type of Therapy/Topic:  Group Therapy:  Emotion Regulation  Participation Level:  Did Not Attend   Description of Group:    The purpose of this group is to assist patients in learning to regulate negative emotions and experience positive emotions. Patients will be guided to discuss ways in which they have been vulnerable to their negative emotions. These vulnerabilities will be juxtaposed with experiences of positive emotions or situations, and patients will be challenged to use positive emotions to combat negative ones. Special emphasis will be placed on coping with negative emotions in conflict situations, and patients will process healthy conflict resolution skills.  Therapeutic Goals: 1. Patient will identify two positive emotions or experiences to reflect on in order to balance out negative emotions 2. Patient will label two or more emotions that they find the most difficult to experience 3. Patient will demonstrate positive conflict resolution skills through discussion and/or role plays  Summary of Patient Progress:  Gloria Perez was invited to today's group, but chose not to attend.     Therapeutic Modalities:   Cognitive Behavioral Therapy Feelings Identification Dialectical Behavioral Therapy

## 2018-05-17 NOTE — Progress Notes (Signed)
One on one still in progress for safety. No Issues noted. Will continue to monitor.

## 2018-05-17 NOTE — Progress Notes (Signed)
Patient alert and oriented x 3, denies pain and discomfort, affect is flat, mood is pleasant denies SI/HI/AVH, no distress noted,  thoughts are organized, special soft and logical/coherent, interacting appropriately with staff and peers. Patient was given support and encouragement, will continue to closely monitor.

## 2018-05-17 NOTE — Progress Notes (Signed)
Safety sitter at bedside with no issue, will continue to monitor.

## 2018-05-17 NOTE — Progress Notes (Signed)
1:1 Patient Hourly Rounding  0800: Patient is eating breakfast in the dayroom with her assigned MHT.  0900: Patient is in her room resting with her assigned MHT.  1000: Patient is asleep in her room with her assigned MHT present.  1100: Patient is asleep in her room with her assigned MHT present.  1200: Patient is eating lunch in the dayroom with her assigned MHT.  1300: Patient is in her room with her assigned MHT present.  1400: Patient is asleep in her room with her assigned MHT present.  1500: Patient is asleep in her room with her assigned MHT present.  1600: Patient is asleep in her room with her assigned MHT present.  1700: Patient is on the phone with her assigned MHT present.  1800: Patient is asleep in her room, with her assigned MHT present.  1900: Patient is in her room with her assigned MHT present.

## 2018-05-17 NOTE — Progress Notes (Signed)
D- Patient alert and oriented. Patient presents in a pleasant mood on assessment stating that she slept "alright" last night. Patient complains of a headache rating her pain level a "8/10" requesting pain medication from this Probation officer. Patient denies SI, HI, AVH, at this time. Patient also denies any signs/symptoms of depression/anxiety at this time. Patient has no stated goals for today.   A- Scheduled medications administered to patient, per MD orders. Support and encouragement provided.  Routine safety checks conducted every 15 minutes.  Patient informed to notify staff with problems or concerns.  R- No adverse drug reactions noted. Patient contracts for safety at this time. Patient compliant with medications and treatment plan. Patient receptive, calm, and cooperative. Patient interacts well with others on the unit.  Patient remains safe at this time.

## 2018-05-17 NOTE — Progress Notes (Signed)
Patient continues to be on 1:1 no distress noted.  

## 2018-05-17 NOTE — Discharge Summary (Addendum)
Physician Discharge Summary Note  Patient:  Gloria Perez is an 57 y.o., female MRN:  [id number] DOB:  [birth year] Patient phone:  [phone] (home)  Patient address:   [place] [address],  Total Time spent with patient: 20 minutes plus 15 min on care coordination and documantation.  Date of Admission:  05/14/2018 Date of Discharge: 05/18/2018  Reason for Admission:  Psychotic break.  History of Present Illness:   Identifying data. Gloria Perez is a 57 year old female with a history of schizophrenia.  Chief complaint. "I don't know what happened."  History of present illness. Information was obtained from the patient and the chart. The patient initially came to the ER reporting suicidal ideation. Her group home rwported change in her behavior with paranoia for about two week duration. The patient has been maintained on Prlolixin decanoate injections of 18.5 mg every 3 weeks. Suicidal ideation resolved quickly. She was diagnosed with UTI and treated with Keflex. She experienced a seizure-like episode and was briefly transferred to the ER where she was diagnosed with hypoglycemia.  This morning, the patient is utterly confused. She insists on going "home with my children in [place]" while in fact she resided at the [facility] group home in [place]. She also wants to be taken "to the top of my head". Her sugar level was 74 but VS are unstable. She is tremulous. She has a Actuary. .   Past psychiatric history. Long history pf schizophrenia with multiple hospitalizations and medication trials. Suicide attempt in remote past. She is in the care of [facility] team maintained on Prolixin Decanoate.   Family psychiatric history. None reported.  Social history. She is disabled from mental illness and lives in a group home. She has 4 sisters and children and 8 grandchildren but does not stay in touch.  Principal Problem: Undifferentiated schizophrenia  [facility]) Discharge Diagnoses: Patient Active Problem List   Diagnosis Date Noted  . Undifferentiated schizophrenia ([place]) [F20.3] 04/05/2018    Priority: High  . Tobacco use disorder [F17.200] 05/07/2018   Past Medical History:  Past Medical History:  Diagnosis Date  . Abnormal thyroid blood test   . Acute left-sided low back pain without sciatica   . Asthma   . B12 deficiency   . CHF (congestive heart failure) ([place])   . Chronic pain   . Contusion of lower back   . COPD (chronic obstructive pulmonary disease) ([place])   . Falling episodes   . GERD (gastroesophageal reflux disease)   . Hepatitis   . Hypertension   . Nonischemic cardiomyopathy ([place])   . Physical debility   . Pre-diabetes   . Schizophrenia ([place])   . Severe obesity (BMI 35.0-35.9 with comorbidity) ([place])   . Vitamin D deficiency   . Weight loss     Past Surgical History:  Procedure Laterality Date  . CESAREAN SECTION    . COLONOSCOPY    . COLONOSCOPY WITH PROPOFOL N/A 02/22/2018   Procedure: COLONOSCOPY WITH PROPOFOL;  Surgeon: Toledo, Benay Pike, MD;  Location: [facility] ENDOSCOPY;  Service: Gastroenterology;  Laterality: N/A;  . Right arm surgery    . TUBAL LIGATION     Family History:  Family History  Problem Relation Age of Onset  . Diabetes Mother    Social History:  Social History   Substance and Sexual Activity  Alcohol Use Not Currently     Social History   Substance and Sexual Activity  Drug Use Not Currently  Social History   Socioeconomic History  . Marital status: Widowed    Spouse name: Not on file  . Number of children: Not on file  . Years of education: Not on file  . Highest education level: Not on file  Occupational History  . Not on file  Social Needs  . Financial resource strain: Not on file  . Food insecurity:    Worry: Not on file    Inability: Not on file  . Transportation needs:    Medical: Not on file    Non-medical: Not on file  Tobacco Use  . Smoking status: Current  Every Day Smoker    Packs/day: 0.50    Years: 35.00    Pack years: 17.50    Types: Cigarettes  . Smokeless tobacco: Never Used  Substance and Sexual Activity  . Alcohol use: Not Currently  . Drug use: Not Currently  . Sexual activity: Not on file  Lifestyle  . Physical activity:    Days per week: Not on file    Minutes per session: Not on file  . Stress: Not on file  Relationships  . Social connections:    Talks on phone: Not on file    Gets together: Not on file    Attends religious service: Not on file    Active member of club or organization: Not on file    Attends meetings of clubs or organizations: Not on file    Relationship status: Not on file  Other Topics Concern  . Not on file  Social History Narrative  . Not on file    Hospital Course:    Gloria Perez is a 57 year old female with a history of schizophrenia admitted for psychotic break. She was briefly discharged to the ER where she was treated for an episode of hypoglycemia. Her blood glucose has stabilized. At the time of discharge, she is no longer overtly psychotic. There are baseline non-command hallucinations. She tolerates medications well. She is cool and collected.   #Mood/psychosis, improved -continue Prolixin 18.75 mg every 3 weeks, next injection on 05/28/2018 -patient was given Prolixin 50 mg IM on 7/1 -we discontinued Wellbutrin and Cymbalta -continue Seroquel 50 mg nightly  #EPS, improved -we discontinued Cogentin  -continue Benadryl 25 mg TID    #Asthma -continue Spiriva  #UTI, resolved -completed course of Keflex  #Hypoglycemia, resolved  #Falls -head CT scan negative  #Labs completed  #Smoking cessation -nicotine patch was available  #Disposition -discharge back to her group home -follow up with ACT team     Physical Findings: AIMS: Facial and Oral Movements Muscles of Facial Expression: Mild Lips and Perioral Area: Minimal Jaw: None, normal Tongue:  Minimal,Extremity Movements Upper (arms, wrists, hands, fingers): Moderate Lower (legs, knees, ankles, toes): Mild, Trunk Movements Neck, shoulders, hips: None, normal, Overall Severity Severity of abnormal movements (highest score from questions above): None, normal Incapacitation due to abnormal movements: Mild Patient's awareness of abnormal movements (rate only patient's report): Aware, mild distress, Dental Status Current problems with teeth and/or dentures?: Yes(no teeth) Does patient usually wear dentures?: Yes  CIWA:    COWS:     Musculoskeletal: Strength & Muscle Tone: within normal limits Gait & Station: normal Patient leans: N/A  Psychiatric Specialty Exam: Physical Exam  Nursing note and vitals reviewed. Psychiatric: She has a normal mood and affect. Her speech is normal. Judgment and thought content normal. She is actively hallucinating. Cognition and memory are normal.    Review of Systems  Neurological: Negative.  Psychiatric/Behavioral: Positive for hallucinations.  All other systems reviewed and are negative.   Blood pressure 97/78, pulse 87, temperature 98.3 F (36.8 C), resp. rate 18, height 5' (1.524 m), weight 65.8 kg (145 lb), SpO2 100 %.Body mass index is 28.32 kg/m.  General Appearance: Casual  Eye Contact:  Good  Speech:  Clear and Coherent  Volume:  Normal  Mood:  Euthymic  Affect:  Appropriate  Thought Process:  Goal Directed and Descriptions of Associations: Intact  Orientation:  Full (Time, Place, and Person)  Thought Content:  Hallucinations: Auditory  Suicidal Thoughts:  No  Homicidal Thoughts:  No  Memory:  Immediate;   Fair Recent;   Fair Remote;   Fair  Judgement:  Impaired  Insight:  Shallow  Psychomotor Activity:  Normal  Concentration:  Concentration: Fair and Attention Span: Fair  Recall:  [employer] of Knowledge:  Fair  Language:  Fair  Akathisia:  No  Handed:  Right  AIMS (if indicated):     Assets:  Communication  Skills Desire for Improvement Financial Resources/Insurance Housing Physical Health Resilience Social Support  ADL's:  Intact  Cognition:  WNL  Sleep:  Number of Hours: 7     Have you used any form of tobacco in the last 30 days? (Cigarettes, Smokeless Tobacco, Cigars, and/or Pipes): No  Has this patient used any form of tobacco in the last 30 days? (Cigarettes, Smokeless Tobacco, Cigars, and/or Pipes) Yes, Yes, A prescription for an FDA-approved tobacco cessation medication was offered at discharge and the patient refused  Blood Alcohol level:  Lab Results  Component Value Date   ETH <10 05/02/2018   ETH <10 24/40/1027    Metabolic Disorder Labs:  Lab Results  Component Value Date   HGBA1C 4.8 05/04/2018   MPG 91.06 05/04/2018   MPG 88.19 05/02/2018   No results found for: PROLACTIN Lab Results  Component Value Date   CHOL 189 05/04/2018   TRIG 136 05/04/2018   HDL 79 05/04/2018   CHOLHDL 2.4 05/04/2018   VLDL 27 05/04/2018   LDLCALC 83 05/04/2018   LDLCALC 58 08/17/2017    See Psychiatric Specialty Exam and Suicide Risk Assessment completed by Attending Physician prior to discharge.  Discharge destination:  Home  Is patient on multiple antipsychotic therapies at discharge:  Yes,   Do you recommend tapering to monotherapy for antipsychotics?  Yes   Has Patient had three or more failed trials of antipsychotic monotherapy by history:  No  Recommended Plan for Multiple Antipsychotic Therapies: Taper to monotherapy as described:  discontinue Seroquel when appropriate  Discharge Instructions    Diet - low sodium heart healthy   Complete by:  As directed    Increase activity slowly   Complete by:  As directed      Allergies as of 05/18/2018      Reactions   Lisinopril    Metformin And Related       Medication List    STOP taking these medications   benztropine 0.5 MG tablet Commonly known as:  COGENTIN   buPROPion 150 MG 12 hr tablet Commonly known  as:  WELLBUTRIN SR   DULoxetine 30 MG capsule Commonly known as:  CYMBALTA   hydrOXYzine 25 MG tablet Commonly known as:  ATARAX/VISTARIL   lamoTRIgine 25 MG tablet Commonly known as:  LAMICTAL   lisinopril 5 MG tablet Commonly known as:  PRINIVIL,ZESTRIL   traZODone 50 MG tablet Commonly known as:  DESYREL     TAKE these  medications     Indication  Cholecalciferol 1000 units tablet Take 1 tablet (1,000 Units total) by mouth daily.  Indication:  general health   diphenhydrAMINE 25 mg capsule Commonly known as:  BENADRYL Take 1 capsule (25 mg total) by mouth 3 (three) times daily.  Indication:  Extrapyramidal Reaction   fluPHENAZine decanoate 25 MG/ML injection Commonly known as:  PROLIXIN Inject 0.8 mLs (20 mg total) into the muscle every 21 ( twenty-one) days. Next injectio on 05/28/2018 Start taking on:  05/28/2018  Indication:  Schizophrenia   omeprazole 20 MG capsule Commonly known as:  PRILOSEC Take 1 capsule (20 mg total) by mouth daily.  Indication:  Gastroesophageal Reflux Disease   QUEtiapine 50 MG tablet Commonly known as:  SEROQUEL Take 1 tablet (50 mg total) by mouth at bedtime.  Indication:  Schizophrenia   tiotropium 18 MCG inhalation capsule Commonly known as:  SPIRIVA Place 1 capsule (18 mcg total) into inhaler and inhale daily. What changed:  when to take this  Indication:  Chronic Obstructive Lung Disease        Follow-up recommendations:  Activity:  as tolerated Diet:  low sodium heart healthy Other:  keep follow up appointments  Comments:     Signed: Orson Slick, MD 05/18/2018, 9:11 AM

## 2018-05-17 NOTE — Progress Notes (Signed)
Recreation Therapy Notes  Date: 05/17/2018  Time: 9:30 am   Location: Craft Room   Behavioral response: N/A   Intervention Topic:  Coping  Discussion/Intervention: Patient did not attend group.   Clinical Observations/Feedback:  Patient did not attend group.   [place] LRT/CTRS         Gurnie Duris 05/17/2018 11:59 AM

## 2018-05-17 NOTE — [facility] Suicide Risk Assessment (Signed)
[facility] Discharge Suicide Risk Assessment   Principal Problem: Undifferentiated schizophrenia [facility]) Discharge Diagnoses:  Patient Active Problem List   Diagnosis Date Noted  . Undifferentiated schizophrenia ([place]) [F20.3] 04/05/2018    Priority: High  . Tobacco use disorder [F17.200] 05/07/2018    Total Time spent with patient: 20 minutes  Musculoskeletal: Strength & Muscle Tone: within normal limits Gait & Station: normal Patient leans: N/A  Psychiatric Specialty Exam: Review of Systems  Neurological: Negative.   Psychiatric/Behavioral: Positive for hallucinations.  All other systems reviewed and are negative.   Blood pressure 97/78, pulse 87, temperature 98.3 F (36.8 C), resp. rate 18, height 5' (1.524 m), weight 65.8 kg (145 lb), SpO2 100 %.Body mass index is 28.32 kg/m.  General Appearance: Casual  Eye Contact::  Good  Speech:  Clear and Coherent409  Volume:  Normal  Mood:  Euthymic  Affect:  Flat  Thought Process:  Goal Directed and Descriptions of Associations: Intact  Orientation:  Full (Time, Place, and Person)  Thought Content:  Hallucinations: Auditory  Suicidal Thoughts:  No  Homicidal Thoughts:  No  Memory:  Immediate;   Fair Recent;   Fair Remote;   Fair  Judgement:  Impaired  Insight:  Shallow  Psychomotor Activity:  Normal  Concentration:  Fair  Recall:  [employer] of Knowledge:Fair  Language: Fair  Akathisia:  No  Handed:  Right  AIMS (if indicated):     Assets:  Communication Skills Desire for Improvement Financial Resources/Insurance Housing Resilience Social Support  Sleep:  Number of Hours: 7  Cognition: WNL  ADL's:  Intact   Mental Status Per Nursing Assessment::   On Admission:  NA  Demographic Factors:  Divorced or widowed  Loss Factors: NA  Historical Factors: Impulsivity  Risk Reduction Factors:   Sense of responsibility to family, Living with another person, especially a relative and Positive social support  Continued  Clinical Symptoms:  Schizophrenia:   Paranoid or undifferentiated type  Cognitive Features That Contribute To Risk:  None    Suicide Risk:  Minimal: No identifiable suicidal ideation.  Patients presenting with no risk factors but with morbid ruminations; may be classified as minimal risk based on the severity of the depressive symptoms    Plan Of Care/Follow-up recommendations:  Activity:  as tolerated Diet:  low sodiumheart healthy Other:  keep follow up appointments  Orson Slick, MD 05/18/2018, 8:03 AM

## 2018-05-17 NOTE — Plan of Care (Signed)
  Problem: Safety: Goal: Periods of time without injury will increase Outcome: Progressing  Patient continues to be safe 1:1 safety sitter at bedside.

## 2018-05-17 NOTE — Plan of Care (Signed)
Patient denies SI/HI/AVH as well as any signs/symptoms of depression/anxiety at this time. Patient has the ability to verbalize her anger and frustrations in an appropriate manner. Patient remains free from injury and is safe on the unit at this time.  Problem: Education: Goal: Mental status will improve Outcome: Progressing   Problem: Coping: Goal: Ability to verbalize frustrations and anger appropriately will improve Outcome: Progressing   Problem: Safety: Goal: Periods of time without injury will increase Outcome: Progressing

## 2018-05-17 NOTE — Progress Notes (Addendum)
[facility] MD Progress Note  05/17/2018 12:40 PM Gloria Perez  MRN:  [id number]  Subjective:    Gloria Perez seems better today. I observed her eating lunch all by herself today. Her tremors are better with Benadryl than with Cogentin. She is able to ambulate well with a walker. She still has a Actuary for frequent falls. Most importantly, her confusion has resolved. She knows she lives in [place] and is ready to return to the facility. She still hallucinates but it may be her baseline. We will attempt ro discharge tomorrow if this improvement continues.   Principal Problem: Undifferentiated schizophrenia ([place]) Diagnosis:   Patient Active Problem List   Diagnosis Date Noted  . Undifferentiated schizophrenia ([place]) [F20.3] 04/05/2018    Priority: High  . Tobacco use disorder [F17.200] 05/07/2018   Total Time spent with patient: 20 minutes  Past Psychiatric History: schizophrenia  Past Medical History:  Past Medical History:  Diagnosis Date  . Abnormal thyroid blood test   . Acute left-sided low back pain without sciatica   . Asthma   . B12 deficiency   . CHF (congestive heart failure) ([place])   . Chronic pain   . Contusion of lower back   . COPD (chronic obstructive pulmonary disease) ([place])   . Falling episodes   . GERD (gastroesophageal reflux disease)   . Hepatitis   . Hypertension   . Nonischemic cardiomyopathy ([place])   . Physical debility   . Pre-diabetes   . Schizophrenia ([place])   . Severe obesity (BMI 35.0-35.9 with comorbidity) ([place])   . Vitamin D deficiency   . Weight loss     Past Surgical History:  Procedure Laterality Date  . CESAREAN SECTION    . COLONOSCOPY    . COLONOSCOPY WITH PROPOFOL N/A 02/22/2018   Procedure: COLONOSCOPY WITH PROPOFOL;  Surgeon: Toledo, Benay Pike, MD;  Location: [facility] ENDOSCOPY;  Service: Gastroenterology;  Laterality: N/A;  . Right arm surgery    . TUBAL LIGATION     Family History:  Family History  Problem Relation Age of Onset  .  Diabetes Mother    Family Psychiatric  History: none Social History:  Social History   Substance and Sexual Activity  Alcohol Use Not Currently     Social History   Substance and Sexual Activity  Drug Use Not Currently    Social History   Socioeconomic History  . Marital status: Widowed    Spouse name: Not on file  . Number of children: Not on file  . Years of education: Not on file  . Highest education level: Not on file  Occupational History  . Not on file  Social Needs  . Financial resource strain: Not on file  . Food insecurity:    Worry: Not on file    Inability: Not on file  . Transportation needs:    Medical: Not on file    Non-medical: Not on file  Tobacco Use  . Smoking status: Current Every Day Smoker    Packs/day: 0.50    Years: 35.00    Pack years: 17.50    Types: Cigarettes  . Smokeless tobacco: Never Used  Substance and Sexual Activity  . Alcohol use: Not Currently  . Drug use: Not Currently  . Sexual activity: Not on file  Lifestyle  . Physical activity:    Days per week: Not on file    Minutes per session: Not on file  . Stress: Not on file  Relationships  . Social connections:  Talks on phone: Not on file    Gets together: Not on file    Attends religious service: Not on file    Active member of club or organization: Not on file    Attends meetings of clubs or organizations: Not on file    Relationship status: Not on file  Other Topics Concern  . Not on file  Social History Narrative  . Not on file   Additional Social History:    History of alcohol / drug use?: No history of alcohol / drug abuse                    Sleep: Fair  Appetite:  Fair  Current Medications: Current Facility-Administered Medications  Medication Dose Route Frequency Provider Last Rate Last Dose  . acetaminophen (TYLENOL) tablet 650 mg  650 mg Oral Q6H PRN Lenward Chancellor, MD   650 mg at 05/17/18 0915  . alum & mag hydroxide-simeth  (MAALOX/MYLANTA) 200-200-20 MG/5ML suspension 30 mL  30 mL Oral Q4H PRN Lenward Chancellor, MD      . cholecalciferol (VITAMIN D) tablet 1,000 Units  1,000 Units Oral Daily Lenward Chancellor, MD   1,000 Units at 05/17/18 0913  . diphenhydrAMINE (BENADRYL) capsule 25 mg  25 mg Oral TID Pucilowska, Jolanta B, MD   25 mg at 05/17/18 0913  . [START ON 05/28/2018] fluPHENAZine decanoate (PROLIXIN) injection 20 mg  20 mg Intramuscular Q21 days Pucilowska, Jolanta B, MD      . magnesium hydroxide (MILK OF MAGNESIA) suspension 30 mL  30 mL Oral Daily PRN Lenward Chancellor, MD      . pantoprazole (PROTONIX) EC tablet 40 mg  40 mg Oral Daily Lenward Chancellor, MD   40 mg at 05/17/18 0913  . QUEtiapine (SEROQUEL) tablet 50 mg  50 mg Oral QHS Pucilowska, Jolanta B, MD   50 mg at 05/16/18 2143  . tiotropium (SPIRIVA) inhalation capsule 18 mcg  18 mcg Inhalation Daily Lenward Chancellor, MD   18 mcg at 05/17/18 0914  . traZODone (DESYREL) tablet 50 mg  50 mg Oral QHS Lenward Chancellor, MD   50 mg at 05/16/18 2143    Lab Results:  Results for orders placed or performed during the hospital encounter of 05/14/18 (from the past 48 hour(s))  Glucose, capillary     Status: None   Collection Time: 05/15/18  9:28 PM  Result Value Ref Range   Glucose-Capillary 80 70 - 99 mg/dL   Comment 1 Notify RN   Glucose, capillary     Status: None   Collection Time: 05/16/18  7:06 AM  Result Value Ref Range   Glucose-Capillary 74 70 - 99 mg/dL   Comment 1 Notify RN   Glucose, capillary     Status: Abnormal   Collection Time: 05/16/18  9:55 AM  Result Value Ref Range   Glucose-Capillary 147 (H) 70 - 99 mg/dL   Comment 1 Notify RN   Glucose, capillary     Status: Abnormal   Collection Time: 05/16/18 11:23 AM  Result Value Ref Range   Glucose-Capillary 101 (H) 70 - 99 mg/dL   Comment 1 Notify RN   Glucose, capillary     Status: None   Collection Time: 05/16/18  4:26 PM  Result Value Ref Range   Glucose-Capillary 79 70 -  99 mg/dL   Comment 1 Notify RN     Blood Alcohol level:  Lab Results  Component Value Date   ETH <10 05/02/2018   ETH <  10 18/29/9371    Metabolic Disorder Labs: Lab Results  Component Value Date   HGBA1C 4.8 05/04/2018   MPG 91.06 05/04/2018   MPG 88.19 05/02/2018   No results found for: PROLACTIN Lab Results  Component Value Date   CHOL 189 05/04/2018   TRIG 136 05/04/2018   HDL 79 05/04/2018   CHOLHDL 2.4 05/04/2018   VLDL 27 05/04/2018   LDLCALC 83 05/04/2018   LDLCALC 58 08/17/2017    Physical Findings: AIMS: Facial and Oral Movements Muscles of Facial Expression: Mild Lips and Perioral Area: Minimal Jaw: None, normal Tongue: Minimal,Extremity Movements Upper (arms, wrists, hands, fingers): Moderate Lower (legs, knees, ankles, toes): Mild, Trunk Movements Neck, shoulders, hips: None, normal, Overall Severity Severity of abnormal movements (highest score from questions above): None, normal Incapacitation due to abnormal movements: Mild Patient's awareness of abnormal movements (rate only patient's report): Aware, mild distress, Dental Status Current problems with teeth and/or dentures?: Yes(no teeth) Does patient usually wear dentures?: Yes  CIWA:    COWS:     Musculoskeletal: Strength & Muscle Tone: within normal limits Gait & Station: normal Patient leans: N/A  Psychiatric Specialty Exam: Physical Exam  Nursing note and vitals reviewed. Psychiatric: She has a normal mood and affect. Her speech is normal. Judgment and thought content normal. She is actively hallucinating. Cognition and memory are normal.    Review of Systems  Neurological: Negative.   Psychiatric/Behavioral: Positive for hallucinations.  All other systems reviewed and are negative.   Blood pressure 110/71, pulse 96, temperature 97.7 F (36.5 C), temperature source Oral, resp. rate 18, height 5' (1.524 m), weight 65.8 kg (145 lb), SpO2 99 %.Body mass index is 28.32 kg/m.  General  Appearance: Casual  Eye Contact:  Good  Speech:  Clear and Coherent  Volume:  Normal  Mood:  Euthymic  Affect:  Appropriate  Thought Process:  Goal Directed and Descriptions of Associations: Intact  Orientation:  Full (Time, Place, and Person)  Thought Content:  Hallucinations: Auditory  Suicidal Thoughts:  No  Homicidal Thoughts:  No  Memory:  Immediate;   Fair Recent;   Fair Remote;   Fair  Judgement:  Impaired  Insight:  Shallow  Psychomotor Activity:  Normal  Concentration:  Concentration: Fair and Attention Span: Fair  Recall:  [employer] of Knowledge:  Fair  Language:  Fair  Akathisia:  No  Handed:  Right  AIMS (if indicated):     Assets:  Communication Skills Desire for Improvement Financial Resources/Insurance Housing Physical Health Resilience Social Support  ADL's:  Intact  Cognition:  WNL  Sleep:  Number of Hours: 7     Treatment Plan Summary: Daily contact with patient to assess and evaluate symptoms and progress in treatment and Medication management   Ms. Kondo is a 57 year old female with a history of schizophrenia admitted for psychotic break who returns after brief visit in the ER for episode of hypoglycemia and seizure. Confusion has resolved. Continues to hallucinate. Will attempt to discharge tomorrow.  #Mood/psychosis, improved -continue Prolixin 18.75 mg every 3 weeks -patient was given Prolixin 50 mg IM on 7/1 -discontinue Wellbutrin 150 mg daily -discontinue Cymbalta 60 mg daily -continue Seroquel 50 mg nightly  #EPS, improved -discontinue Cogentin 2 mg daily -discontinue Symmetrel 100 mg BID -start Benadryl 25 mg TID   #Insomnia  -slept 7 hours with Seroquel -discontinue Trazodone   #Asthma -continue Spiriva  #UTI, resolved -completed a course of Keflex  #Hypoglycemia, resolved  #Seizure -head CT scan negative -  1:1 sitter  #Labs completed  #Smoking cessation -nicotine patch is  available  #Disposition -discharge back to her group home -follow up with ACT team    Orson Slick, MD 05/17/2018, 12:40 PM

## 2018-05-17 NOTE — Progress Notes (Signed)
Patient ID: Gloria Perez, female   DOB: 09-24-1961, 57 y.o.   MRN: [id number] CSW followed up with Carilyn Goodpasture with [facility] to inform her that pt may be able to discharge back to the [facility] on Thursday, May 18, 2018.  CSW informed Carilyn Goodpasture that she would contact her Thursday morning to confrm if pt's attending physician will be discharging her.  Carilyn Goodpasture shared with CSW that someone from the [facility] would be able to pick pt up after 2:00 PM on May 18, 2018 if pt will be discharging.

## 2018-05-18 LAB — GLUCOSE, CAPILLARY
GLUCOSE-CAPILLARY: 115 mg/dL — AB (ref 70–99)
GLUCOSE-CAPILLARY: 94 mg/dL (ref 70–99)
Glucose-Capillary: 90 mg/dL (ref 70–99)

## 2018-05-18 NOTE — Progress Notes (Addendum)
Safety sitter at arms length no issues. Patient denies any SI/HI. Will continue to monitor.

## 2018-05-18 NOTE — Progress Notes (Signed)
Safety sitter at arms length no issues. Patient denies any SI/HI. Will continue to monitor.

## 2018-05-18 NOTE — [facility] Group Notes (Signed)
LCSW Group Therapy Note 05/18/2018 9:00 AM  Type of Therapy and Topic:  Group Therapy:  Setting Goals  Participation Level:  Did Not Attend  Description of Group: In this process group, patients discussed using strengths to work toward goals and address challenges.  Patients identified two positive things about themselves and one goal they were working on.  Patients were given the opportunity to share openly and support each other's plan for self-empowerment.  The group discussed the value of gratitude and were encouraged to have a daily reflection of positive characteristics or circumstances.  Patients were encouraged to identify a plan to utilize their strengths to work on current challenges and goals.  Therapeutic Goals 1. Patient will verbalize personal strengths/positive qualities and relate how these can assist with achieving desired personal goals 2. Patients will verbalize affirmation of peers plans for personal change and goal setting 3. Patients will explore the value of gratitude and positive focus as related to successful achievement of goals 4. Patients will verbalize a plan for regular reinforcement of personal positive qualities and circumstances.  Summary of Patient Progress:   Natayla was invited to today's group, but chose not to attend.    Therapeutic Modalities Cognitive Behavioral Therapy Motivational Interviewing    Devona Konig, Marlinda Mike 05/18/2018 2:31 PM

## 2018-05-18 NOTE — Progress Notes (Signed)
Recreation Therapy Notes  INPATIENT RECREATION TR PLAN  Patient Details Name: Gloria Perez MRN: [id number] DOB: 07/31/1961 Today's Date: 05/18/2018  Rec Therapy Plan Is patient appropriate for Therapeutic Recreation?: Yes Treatment times per week: at least 3 Estimated Length of Stay: 5-7 days TR Treatment/Interventions: Group participation (Comment)  Discharge Criteria Pt will be discharged from therapy if:: Discharged Treatment plan/goals/alternatives discussed and agreed upon by:: Patient/family  Discharge Summary Short term goals set: Patient will engage in groups without prompting or encouragement from LRT x3 group sessions within 5 recreation therapy group sessions Short term goals met: Not met Progress toward goals comments: Groups attended Which groups?: Self-esteem Reason goals not met: Patient spent most of her time in her room Therapeutic equipment acquired: N/A Reason patient discharged from therapy: Discharge from hospital Pt/family agrees with progress & goals achieved: Yes Date patient discharged from therapy: 05/18/18   Markeia Harkless 05/18/2018, 2:01 PM

## 2018-05-18 NOTE — Progress Notes (Signed)
Safety sitter at bedside, no issues noted. Will continue to monitor.

## 2018-05-18 NOTE — Progress Notes (Signed)
Recreation Therapy Notes   Date: 05/18/2018  Time: 9:30 am   Location: Craft Room   Behavioral response: N/A   Intervention Topic:  Anger Management   Discussion/Intervention: Patient did not attend group.   Clinical Observations/Feedback:  Patient did not attend group.   Lisa Milian LRT/CTRS        Shawnn Bouillon 05/18/2018 10:51 AM

## 2018-05-18 NOTE — Progress Notes (Signed)
Hourly Rounding: 0800: In dayroom.  1:1 sitter for safety 9:00: Resting in bed with eyes closed.  1:1 maintained. 10:00: Resting in bed, talking with 1:1 sitter 11:00: Resting in bed with eyes closed.  1:1 maintained. 12:00: In dayroom eating lunch.  1:1 maintained. 1:00: Resting ing bed.  1:1 maintained 2:00: Resting in bed.  1:1 maintained.  1430 discharge instructions given, verbalized understanding.  Prescriptions and FL2 given. Personal belongings returned.  Escorted off unit by this Probation officer to meet group home to travel home.

## 2018-05-18 NOTE — Progress Notes (Addendum)
No safety issues while patient is on one on one with a sitter. Will  Continue to monitor. Patient slept for an hour and 15 minutes

## 2018-05-18 NOTE — NC FL2 (Signed)
[place] LEVEL OF CARE SCREENING TOOL     IDENTIFICATION  Patient Name: Gloria Perez Birthdate: [birth year] Sex: female Admission Date (Current Location): 05/14/2018  [place] and [place] Number:   Selena Lesser   [id number] [place] and Address:   [facility]  [place], [place],   [address]      Provider Number:  [id number]  Attending Physician Name and Address:  Clovis Fredrickson, MD  Relative Name and Phone Number:   Marcelo Baldy  [phone]    Current Level of Care:  Inpatient/Hospital Recommended Level of Care:  Level II/Group Home Prior Approval Number:    Date Approved/Denied:   PASRR Number:    Discharge Plan:  Level II/Group Home    Current Diagnoses: Patient Active Problem List   Diagnosis Date Noted  . Tobacco use disorder 05/07/2018  . Undifferentiated schizophrenia ([place]) 04/05/2018    Orientation RESPIRATION BLADDER Height & Weight      Self, Time, Situation, Place   Normal  Continent Weight: 145 lb (65.8 kg) Height:  5' (152.4 cm)  BEHAVIORAL SYMPTOMS/MOOD NEUROLOGICAL BOWEL NUTRITION STATUS       Continent  Regular Diet  AMBULATORY STATUS COMMUNICATION OF NEEDS Skin    Walks with walker, but gait is steady  Verbally  Normal                       Personal Care Assistance Level of Assistance   Bathing, Feeding, Dressing  All Independent         Functional Limitations Info   Sight, Hearing, Speech  All Adequate        SPECIAL CARE FACTORS FREQUENCY                       Contractures  Not present    Additional Factors Info                  Current Medications (05/18/2018):  This is the current hospital active medication list Current Facility-Administered Medications  Medication Dose Route Frequency Provider Last Rate Last Dose  . acetaminophen (TYLENOL) tablet 650 mg  650 mg Oral Q6H PRN Lenward Chancellor, MD   650 mg at 05/17/18 0915  . alum & mag  hydroxide-simeth (MAALOX/MYLANTA) 200-200-20 MG/5ML suspension 30 mL  30 mL Oral Q4H PRN Lenward Chancellor, MD      . cholecalciferol (VITAMIN D) tablet 1,000 Units  1,000 Units Oral Daily Lenward Chancellor, MD   1,000 Units at 05/18/18 0911  . diphenhydrAMINE (BENADRYL) capsule 25 mg  25 mg Oral TID Pucilowska, Jolanta B, MD   25 mg at 05/18/18 0911  . [START ON 05/28/2018] fluPHENAZine decanoate (PROLIXIN) injection 20 mg  20 mg Intramuscular Q21 days Pucilowska, Jolanta B, MD      . magnesium hydroxide (MILK OF MAGNESIA) suspension 30 mL  30 mL Oral Daily PRN Lenward Chancellor, MD      . pantoprazole (PROTONIX) EC tablet 40 mg  40 mg Oral Daily Lenward Chancellor, MD   40 mg at 05/18/18 0911  . QUEtiapine (SEROQUEL) tablet 50 mg  50 mg Oral QHS Pucilowska, Jolanta B, MD   50 mg at 05/17/18 2110  . tiotropium (SPIRIVA) inhalation capsule 18 mcg  18 mcg Inhalation Daily Lenward Chancellor, MD   18 mcg at 05/18/18 6010     Discharge Medications: Please see discharge summary for a list of discharge medications.  Relevant Imaging Results:  Relevant Lab Results:   Additional Information  SSN [id number] Q  Devona Konig, LCSW

## 2018-05-18 NOTE — Progress Notes (Signed)
One on one still in progress for safety. No Issues noted. Will continue to monitor.

## 2018-05-18 NOTE — Progress Notes (Signed)
  [facility] Adult Case Management Discharge Plan :  Will you be returning to the same living situation after discharge:  Yes,  Pt will be returning to the group home. At discharge, do you have transportation home?: Yes,  A group home staff member will be picking pt up at discharge. Do you have the ability to pay for your medications: Yes,  Pt has financial means to pay for her medications.  Release of information consent forms completed and in the chart;  Patient's signature needed at discharge.  Patient to Follow up at: Follow-up Information    [facility] Follow up on 05/18/2018.   Why:  You will be meeting with the ACTT doctor at the office around 3:30 PM for your follow-up appointment. Contact information: [address] [place] [address] [phone]           Next level of care provider has access to [place] and Suicide Prevention discussed: Yes,  No safety concerns noted.  Have you used any form of tobacco in the last 30 days? (Cigarettes, Smokeless Tobacco, Cigars, and/or Pipes): No  Has patient been referred to the Quitline?: N/A patient is not a smoker  Patient has been referred for addiction treatment: [place], LCSW 05/18/2018, 2:41 PM

## 2018-05-18 NOTE — Progress Notes (Signed)
No safety issues while patient is on one on one with a sitter. Will  Continue to monitor.

## 2018-05-18 NOTE — Progress Notes (Signed)
Safety sitter at arms length no issues. Patient denies any SI/HI. Will continue to monitor

## 2018-05-18 NOTE — [facility] Group Notes (Signed)
  05/18/2018  Time: 1PM  Type of Therapy/Topic:  Group Therapy:  Balance in Life  Participation Level:  Did Not Attend  Description of Group:   This group will address the concept of balance and how it feels and looks when one is unbalanced. Patients will be encouraged to process areas in their lives that are out of balance and identify reasons for remaining unbalanced. Facilitators will guide patients in utilizing problem-solving interventions to address and correct the stressor making their life unbalanced. Understanding and applying boundaries will be explored and addressed for obtaining and maintaining a balanced life. Patients will be encouraged to explore ways to assertively make their unbalanced needs known to significant others in their lives, using other group members and facilitator for support and feedback.  Therapeutic Goals: 1. Patient will identify two or more emotions or situations they have that consume much of in their lives. 2. Patient will identify signs/triggers that life has become out of balance:  3. Patient will identify two ways to set boundaries in order to achieve balance in their lives:  4. Patient will demonstrate ability to communicate their needs through discussion and/or role plays  Summary of Patient Progress: Pt was invited to attend group but chose not to attend. CSW will continue to encourage pt to attend group throughout their admission.     Therapeutic Modalities:   Cognitive Behavioral Therapy Solution-Focused Therapy Assertiveness Training  Alden Hipp, MSW, LCSW Clinical Social Worker 05/18/2018 2:56 PM

## 2018-05-29 ENCOUNTER — Encounter: Payer: Self-pay | Admitting: Emergency Medicine

## 2018-05-29 ENCOUNTER — Emergency Department: Payer: Medicare (Managed Care)

## 2018-05-29 ENCOUNTER — Emergency Department
Admission: EM | Admit: 2018-05-29 | Discharge: 2018-05-30 | Disposition: A | Discharge status: Other Acute Inpatient Hospital | Payer: Medicare (Managed Care) | Attending: Emergency Medicine | Admitting: Emergency Medicine

## 2018-05-29 DIAGNOSIS — I509 Heart failure, unspecified: Secondary | ICD-10-CM | POA: Insufficient documentation

## 2018-05-29 DIAGNOSIS — R451 Restlessness and agitation: Secondary | ICD-10-CM | POA: Diagnosis present

## 2018-05-29 DIAGNOSIS — F17210 Nicotine dependence, cigarettes, uncomplicated: Secondary | ICD-10-CM | POA: Diagnosis not present

## 2018-05-29 DIAGNOSIS — F209 Schizophrenia, unspecified: Secondary | ICD-10-CM | POA: Insufficient documentation

## 2018-05-29 DIAGNOSIS — I110 Hypertensive heart disease with heart failure: Secondary | ICD-10-CM | POA: Insufficient documentation

## 2018-05-29 DIAGNOSIS — J45909 Unspecified asthma, uncomplicated: Secondary | ICD-10-CM | POA: Diagnosis not present

## 2018-05-29 DIAGNOSIS — F203 Undifferentiated schizophrenia: Secondary | ICD-10-CM | POA: Diagnosis not present

## 2018-05-29 DIAGNOSIS — Z79899 Other long term (current) drug therapy: Secondary | ICD-10-CM | POA: Insufficient documentation

## 2018-05-29 DIAGNOSIS — J449 Chronic obstructive pulmonary disease, unspecified: Secondary | ICD-10-CM | POA: Insufficient documentation

## 2018-05-29 DIAGNOSIS — F29 Unspecified psychosis not due to a substance or known physiological condition: Secondary | ICD-10-CM

## 2018-05-29 LAB — CBC
HEMATOCRIT: 33.9 % — AB (ref 35.0–47.0)
HEMOGLOBIN: 11.9 g/dL — AB (ref 12.0–16.0)
MCH: 31 pg (ref 26.0–34.0)
MCHC: 35.1 g/dL (ref 32.0–36.0)
MCV: 88.4 fL (ref 80.0–100.0)
Platelets: 236 10*3/uL (ref 150–440)
RBC: 3.84 MIL/uL (ref 3.80–5.20)
RDW: 14.2 % (ref 11.5–14.5)
WBC: 4.7 10*3/uL (ref 3.6–11.0)

## 2018-05-29 LAB — COMPREHENSIVE METABOLIC PANEL
ALBUMIN: 3.6 g/dL (ref 3.5–5.0)
ALT: 27 U/L (ref 0–44)
AST: 34 U/L (ref 15–41)
Alkaline Phosphatase: 61 U/L (ref 38–126)
Anion gap: 7 (ref 5–15)
BUN: 8 mg/dL (ref 6–20)
CALCIUM: 8.9 mg/dL (ref 8.9–10.3)
CO2: 28 mmol/L (ref 22–32)
Chloride: 106 mmol/L (ref 98–111)
Creatinine, Ser: 0.67 mg/dL (ref 0.44–1.00)
GFR calc Af Amer: >60 mL/min (ref >60)
GFR calc non Af Amer: >60 mL/min (ref >60)
GLUCOSE: 85 mg/dL (ref 70–99)
Potassium: 3.1 mmol/L — ABNORMAL LOW (ref 3.5–5.1)
Sodium: 141 mmol/L (ref 135–145)
Total Bilirubin: 0.6 mg/dL (ref 0.3–1.2)
Total Protein: 6.5 g/dL (ref 6.5–8.1)

## 2018-05-29 LAB — URINE DRUG SCREEN, QUALITATIVE (ARMC ONLY)
Amphetamines, Ur Screen: NOT DETECTED
Benzodiazepine, Ur Scrn: POSITIVE — AB
COCAINE METABOLITE, UR ~~LOC~~: NOT DETECTED
Cannabinoid 50 Ng, Ur ~~LOC~~: NOT DETECTED
MDMA (Ecstasy)Ur Screen: NOT DETECTED
METHADONE SCREEN, URINE: NOT DETECTED
OPIATE, UR SCREEN: NOT DETECTED
Phencyclidine (PCP) Ur S: NOT DETECTED
Tricyclic, Ur Screen: POSITIVE — AB

## 2018-05-29 LAB — URINALYSIS, COMPLETE (UACMP) WITH MICROSCOPIC
BILIRUBIN URINE: NEGATIVE
Bacteria, UA: NONE SEEN
Glucose, UA: NEGATIVE mg/dL
Hgb urine dipstick: NEGATIVE
Ketones, ur: NEGATIVE mg/dL
Leukocytes, UA: NEGATIVE
Nitrite: NEGATIVE
PROTEIN: NEGATIVE mg/dL
Specific Gravity, Urine: 1.011 (ref 1.005–1.030)
pH: 6 (ref 5.0–8.0)

## 2018-05-29 LAB — SALICYLATE LEVEL: Salicylate Lvl: <7 mg/dL (ref 2.8–30.0)

## 2018-05-29 LAB — ETHANOL: Alcohol, Ethyl (B): <10 mg/dL (ref <10)

## 2018-05-29 LAB — TROPONIN I: Troponin I: <0.03 ng/mL (ref <0.03)

## 2018-05-29 LAB — ACETAMINOPHEN LEVEL

## 2018-05-29 LAB — PREGNANCY, URINE: Preg Test, Ur: NEGATIVE

## 2018-05-29 MED ORDER — LORAZEPAM 2 MG/ML IJ SOLN
1.0000 mg | Freq: Once | INTRAMUSCULAR | Status: DC
  Filled 2018-05-29: qty 1

## 2018-05-29 MED ORDER — LORAZEPAM 2 MG/ML IJ SOLN: 2.0000 mg | Freq: Once | INTRAMUSCULAR | Status: DC

## 2018-05-29 MED ORDER — LORAZEPAM 2 MG/ML IJ SOLN: 1.0000 mg | Freq: Once | INTRAMUSCULAR | Status: DC

## 2018-05-29 MED ORDER — HALOPERIDOL LACTATE 5 MG/ML IJ SOLN
5.0000 mg | Freq: Once | INTRAMUSCULAR | Status: AC
  Administered 2018-05-29: 5 mg via INTRAMUSCULAR
  Filled 2018-05-29: qty 1

## 2018-05-29 MED ORDER — LORAZEPAM 2 MG/ML IJ SOLN
1.0000 mg | Freq: Once | INTRAMUSCULAR | Status: AC
  Administered 2018-05-29: 1 mg via INTRAMUSCULAR

## 2018-05-29 NOTE — ED Notes (Signed)
BEHAVIORAL HEALTH ROUNDING Patient sleeping: No. Patient alert and oriented:  Alert  Behavior appropriate: Yes.  ; If no, describe:  Nutrition and fluids offered: yes Toileting and hygiene offered: Yes  Sitter present: q15 minute observations and security monitoring Law enforcement present: Yes    ENVIRONMENTAL ASSESSMENT Potentially harmful objects out of patient reach: Yes.   Personal belongings secured: Yes.   Patient dressed in hospital provided attire only: Yes.   Plastic bags out of patient reach: Yes.   Patient care equipment (cords, cables, call bells, lines, and drains) shortened, removed, or accounted for: Yes.   Equipment and supplies removed from bottom of stretcher: Yes.   Potentially toxic materials out of patient reach: Yes.   Sharps container removed or out of patient reach: Yes.

## 2018-05-29 NOTE — BH Assessment (Signed)
Assessment Note  Gloria Perez is a 57 y.o. female who present to the ED from her group home in an agitated state. Pt attempted to complete assessment but was unable to finish due to crying spells, agitation, combativeness, and screaming. Per the g/h staff the pt is presenting more agitate than normal. When asked what brought the pt into the ED today she states, "They came in my room and cleaned everything out! I'm on my death bed, My mama is gonna be happy and my grand babies (pt tearful) I'm going to miss them. I got 8 grandchildren." Pt tangential in speech with a flight of ideas, difficult to redirect. When asked if she was currently experiencing SI she states, "I'm dead! I'm already dead! Can't you see? And the cranberry juice is my blood." Pt was given IM medications by RN to calm agitation.   Pt unable to be assessed in regards to A/V H/D but denies SI/HI.  Diagnosis: schizophrenia   Past Medical History:  Past Medical History:  Diagnosis Date  . Abnormal thyroid blood test   . Acute left-sided low back pain without sciatica   . Asthma   . B12 deficiency   . CHF (congestive heart failure) ([place])   . Chronic pain   . Contusion of lower back   . COPD (chronic obstructive pulmonary disease) ([place])   . Falling episodes   . GERD (gastroesophageal reflux disease)   . Hepatitis   . Hypertension   . Nonischemic cardiomyopathy ([place])   . Physical debility   . Pre-diabetes   . Schizophrenia ([place])   . Severe obesity (BMI 35.0-35.9 with comorbidity) ([place])   . Vitamin D deficiency   . Weight loss     Past Surgical History:  Procedure Laterality Date  . CESAREAN SECTION    . COLONOSCOPY    . COLONOSCOPY WITH PROPOFOL N/A 02/22/2018   Procedure: COLONOSCOPY WITH PROPOFOL;  Surgeon: Toledo, Benay Pike, MD;  Location: [facility] ENDOSCOPY;  Service: Gastroenterology;  Laterality: N/A;  . Right arm surgery    . TUBAL LIGATION      Family History:  Family History  Problem Relation Age of Onset   . Diabetes Mother     Social History:  reports that she has been smoking cigarettes.  She has a 17.50 pack-year smoking history. She has never used smokeless tobacco. She reports that she drank alcohol. She reports that she has current or past drug history.  Additional Social History:  Alcohol / Drug Use Pain Medications: SEE MAR Prescriptions: SEE MAR Over the Counter: SEE MAR History of alcohol / drug use?: No history of alcohol / drug abuse  CIWA: CIWA-Ar BP: (!) 157/104 Pulse Rate: (!) 107 COWS:    Allergies:  Allergies  Allergen Reactions  . Lisinopril   . Metformin And Related     Home Medications:  (Not in a hospital admission)  OB/GYN Status:  No LMP recorded. Patient is postmenopausal.  General Assessment Data Location of Assessment: [facility] ED TTS Assessment: In system Is this a Tele or Face-to-Face Assessment?: Face-to-Face Is this an Initial Assessment or a Re-assessment for this encounter?: Initial Assessment Marital status: Widowed [place] name: N/A Is patient pregnant?: No Pregnancy Status: No Living Arrangements: Group Home Can pt return to current living arrangement?: Yes Admission Status: Involuntary Is patient capable of signing voluntary admission?: No Referral Source: Self/Family/Friend Insurance type: Medicaid  Medical Screening Exam ([place]) Medical Exam completed: Yes  Crisis Care Plan Living Arrangements: Group Home  Legal Guardian: Other:(Grace Trenton Gammon) Name of Psychiatrist: [place] Name of Therapist: [facility]  Education Status Is patient currently in school?: No Highest grade of school patient has completed: 10th grade Is the patient employed, unemployed or receiving disability?: Unemployed, Receiving disability income  Risk to self with the past 6 months Suicidal Ideation: No Has patient been a risk to self within the past 6 months prior to admission? : No Suicidal Intent: No Has patient had any suicidal  intent within the past 6 months prior to admission? : No Is patient at risk for suicide?: No Suicidal Plan?: No Has patient had any suicidal plan within the past 6 months prior to admission? : No Access to Means: No What has been your use of drugs/alcohol within the last 12 months?: Pt denies Previous Attempts/Gestures: No How many times?: 0 Other Self Harm Risks: none Triggers for Past Attempts: Unknown Intentional Self Injurious Behavior: None Family Suicide History: Unknown Recent stressful life event(s): Turmoil (Comment)(mental illness) Persecutory voices/beliefs?: No Depression: No Substance abuse history and/or treatment for substance abuse?: No Suicide prevention information given to non-admitted patients: Not applicable  Risk to Others within the past 6 months Homicidal Ideation: No Does patient have any lifetime risk of violence toward others beyond the six months prior to admission? : No Thoughts of Harm to Others: No Current Homicidal Intent: No Current Homicidal Plan: No Access to Homicidal Means: No Identified Victim: none History of harm to others?: No Assessment of Violence: None Noted Violent Behavior Description: none Does patient have access to weapons?: No Criminal Charges Pending?: No Does patient have a court date: No Is patient on probation?: No  Psychosis Hallucinations: None noted Delusions: None noted  Mental Status Report Appearance/Hygiene: In scrubs Eye Contact: Good Motor Activity: Freedom of movement Speech: Logical/coherent, Aggressive, Rapid, Loud Level of Consciousness: Alert, Combative Mood: Irritable Affect: Appropriate to circumstance Anxiety Level: Minimal Thought Processes: Irrelevant, Flight of Ideas, Tangential Judgement: Partial Orientation: Person, Place Obsessive Compulsive Thoughts/Behaviors: Moderate  Cognitive Functioning Concentration: Decreased Memory: Remote Impaired, Recent Impaired Is patient IDD: No Is  patient DD?: No Insight: Fair Impulse Control: Poor Appetite: Good Have you had any weight changes? : No Change Sleep: Unable to Assess Total Hours of Sleep: (unknown) Vegetative Symptoms: None  ADLScreening [facility] Assessment Services) Patient's cognitive ability adequate to safely complete daily activities?: Yes Patient able to express need for assistance with ADLs?: Yes Independently performs ADLs?: Yes (appropriate for developmental age)  Prior Inpatient Therapy Prior Inpatient Therapy: Yes Prior Therapy Dates: 05/2018 Prior Therapy Facilty/Provider(s): [facility]  Prior Outpatient Therapy Prior Outpatient Therapy: Yes Prior Therapy Dates: Currently Prior Therapy Facilty/Provider(s): [facility] Reason for Treatment: Schizophrenia Does patient have an ACCT team?: Yes Does patient have Intensive In-House Services?  : No Does patient have [facility] services? : No Does patient have P4CC services?: No  ADL Screening (condition at time of admission) Patient's cognitive ability adequate to safely complete daily activities?: Yes Is the patient deaf or have difficulty hearing?: No Does the patient have difficulty seeing, even when wearing glasses/contacts?: No Does the patient have difficulty concentrating, remembering, or making decisions?: No Patient able to express need for assistance with ADLs?: Yes Does the patient have difficulty dressing or bathing?: No Independently performs ADLs?: Yes (appropriate for developmental age) Does the patient have difficulty walking or climbing stairs?: No Weakness of Legs: None Weakness of Arms/Hands: None  Home Assistive Devices/Equipment Home Assistive Devices/Equipment: None  Therapy Consults (therapy consults require a  physician order) PT Evaluation Needed: No OT Evalulation Needed: No SLP Evaluation Needed: No Abuse/Neglect Assessment (Assessment to be complete while patient is alone) Abuse/Neglect Assessment Can Be Completed:  Yes Physical Abuse: Denies Verbal Abuse: Denies Sexual Abuse: Denies Exploitation of patient/patient's resources: Denies Self-Neglect: Denies Values / Beliefs Cultural Requests During Hospitalization: None Spiritual Requests During Hospitalization: None Consults Spiritual Care Consult Needed: No Social Work Consult Needed: No Regulatory affairs officer (For Healthcare) Does Patient Have a Medical Advance Directive?: No Would patient like information on creating a medical advance directive?: No - Patient declined    Additional Information 1:1 In Past 12 Months?: No CIRT Risk: No Elopement Risk: No Does patient have medical clearance?: Yes  Child/Adolescent Assessment Running Away Risk: Denies(PT IS AN ADULT)  Disposition:  Disposition Initial Assessment Completed for this Encounter: Yes Disposition of Patient: (Pending Psych eval ) Patient refused recommended treatment: No Mode of transportation if patient is discharged?: Car Patient referred to: (Pending psych eval)  On Site Evaluation by:   Reviewed with Physician:    Matison Nuccio D Alahia Whicker 05/29/2018 1:59 PM

## 2018-05-29 NOTE — Consult Note (Signed)
Psychiatry: Consult received.  I am familiar with the patient.  Came to see her but she was too sedated already.  Could not perform a full evaluation.  Unclear why she is coming back already so agitated.  Urine analysis was still pending earlier today.  I will try to reevaluate tomorrow.

## 2018-05-29 NOTE — ED Notes (Signed)
Patient wearing multiple layers.  Belongings include:  2 bras, black and white tennis shoes, 2 layers of socks, pink underwear, grey pants and denim pants, brown belt and 2 shirts.

## 2018-05-29 NOTE — ED Provider Notes (Signed)
[facility] Emergency Department Provider Note   ____________________________________________   First MD Initiated Contact with Patient 05/29/18 1151     (approximate)  I have reviewed the triage vital signs and the nursing notes.   HISTORY  Chief Complaint Psychiatric Evaluation    HPI Gloria Perez is a 57 y.o. female patient from group home.  Staff says she is been much more agitated than usual.  Coming through the ER going to her room she was yelling and screaming.  Patient told me she is having chest pain but does not want any blood drawn she says she can have any blood drawn because she does not have any blood.  She says we took it all before.  She says she does not have a heart.  Patient initially was going to listen to her heart beating with my stethoscope but then changed her mind.  We will try to talk patient into giving [place] blood and letting us do an EKG and chest x-ray.  She does not want to provide any other history.   Past Medical History:  Diagnosis Date  . Abnormal thyroid blood test   . Acute left-sided low back pain without sciatica   . Asthma   . B12 deficiency   . CHF (congestive heart failure) ([place])   . Chronic pain   . Contusion of lower back   . COPD (chronic obstructive pulmonary disease) ([place])   . Falling episodes   . GERD (gastroesophageal reflux disease)   . Hepatitis   . Hypertension   . Nonischemic cardiomyopathy ([place])   . Physical debility   . Pre-diabetes   . Schizophrenia ([place])   . Severe obesity (BMI 35.0-35.9 with comorbidity) ([place])   . Vitamin D deficiency   . Weight loss     Patient Active Problem List   Diagnosis Date Noted  . Tobacco use disorder 05/07/2018  . Undifferentiated schizophrenia ([place]) 04/05/2018    Past Surgical History:  Procedure Laterality Date  . CESAREAN SECTION    . COLONOSCOPY    . COLONOSCOPY WITH PROPOFOL N/A 02/22/2018   Procedure: COLONOSCOPY WITH PROPOFOL;  Surgeon: Toledo,  Benay Pike, MD;  Location: [facility] ENDOSCOPY;  Service: Gastroenterology;  Laterality: N/A;  . Right arm surgery    . TUBAL LIGATION      Prior to Admission medications   Medication Sig Start Date End Date Taking? Authorizing Provider  Cholecalciferol 1000 units tablet Take 1 tablet (1,000 Units total) by mouth daily. 05/17/18   Pucilowska, Herma Ard B, MD  diphenhydrAMINE (BENADRYL) 25 mg capsule Take 1 capsule (25 mg total) by mouth 3 (three) times daily. 05/18/18   Pucilowska, Herma Ard B, MD  fluPHENAZine decanoate (PROLIXIN) 25 MG/ML injection Inject 0.8 mLs (20 mg total) into the muscle every 21 ( twenty-one) days. Next injectio on 05/28/2018 05/28/18   Pucilowska, Herma Ard B, MD  omeprazole (PRILOSEC) 20 MG capsule Take 1 capsule (20 mg total) by mouth daily. 05/17/18   Pucilowska, Jolanta B, MD  QUEtiapine (SEROQUEL) 50 MG tablet Take 1 tablet (50 mg total) by mouth at bedtime. 05/17/18   Pucilowska, Herma Ard B, MD  tiotropium (SPIRIVA) 18 MCG inhalation capsule Place 1 capsule (18 mcg total) into inhaler and inhale daily. 05/17/18   Pucilowska, Wardell Honour, MD    Allergies Lisinopril and Metformin and related  Family History  Problem Relation Age of Onset  . Diabetes Mother     Social History Social History   Tobacco Use  . Smoking  status: Current Every Day Smoker    Packs/day: 0.50    Years: 35.00    Pack years: 17.50    Types: Cigarettes  . Smokeless tobacco: Never Used  Substance Use Topics  . Alcohol use: Not Currently  . Drug use: Not Currently    Review of Systems  Constitutional: No fever/chills Eyes: No visual changes. ENT: No sore throat. Cardiovascular: Denies chest pain. Respiratory: Denies shortness of breath. Gastrointestinal: No abdominal pain.  No nausea, no vomiting.  No diarrhea.  No constipation. Genitourinary: Negative for dysuria. Musculoskeletal: Negative for back pain. Skin: Negative for rash. Neurological: Negative for headaches, focal  weakness  ____________________________________________   PHYSICAL EXAM:  VITAL SIGNS: ED Triage Vitals  Enc Vitals Group     BP 05/29/18 0947 (!) 157/104     Pulse Rate 05/29/18 0947 (!) 107     Resp 05/29/18 0947 (!) 24     Temp --      Temp Source 05/29/18 0947 Oral     SpO2 05/29/18 0947 100 %     Weight 05/29/18 0956 180 lb (81.6 kg)     Height 05/29/18 0956 5' (1.524 m)     Head Circumference --      Peak Flow --      Pain Score 05/29/18 0955 0     Pain Loc --      Pain Edu? --      Excl. in [place]? --     Constitutional: Alert . Well appearing and in no acute distress. Eyes: Conjunctivae are normal.  Head: Atraumatic. Nose: No congestion/rhinnorhea. Mouth/Throat: Mucous membranes are moist.  Oropharynx non-erythematous. Neck: No stridor.  Cardiovascular: Normal rate, regular rhythm. Grossly normal heart sounds.  Good peripheral circulation. Respiratory: Normal respiratory effort.  No retractions. Lungs CTAB. Gastrointestinal: Soft and nontender. No distention. No abdominal bruits. No CVA tenderness. Musculoskeletal: No lower extremity tenderness nor edema.   Neurologic:  Normal speech and language. No gross focal neurologic deficits are appreciated. No gait instability. Skin:  Skin is warm, dry and intact. No rash noted. Psychiatric: Patient has a history of schizophrenia and appears to be paranoid and also at times responding to internal stimuli  ____________________________________________   LABS (all labs ordered are listed, but only abnormal results are displayed)  Labs Reviewed  URINE DRUG SCREEN, QUALITATIVE ([place]) - Abnormal; Notable for the following components:      Result Value   Tricyclic, Ur Screen POSITIVE (*)    Barbiturates, Ur Screen   (*)    Value: Result not available. Reagent lot number recalled by manufacturer.   Benzodiazepine, Ur Scrn POSITIVE (*)    All other components within normal limits  COMPREHENSIVE METABOLIC PANEL  ETHANOL   SALICYLATE LEVEL  ACETAMINOPHEN LEVEL  CBC  TROPONIN I  PREGNANCY, URINE   ____________________________________________  EKG   ____________________________________________  RADIOLOGY  ED MD interpretation:   Official radiology report(s): Dg Chest Portable 1 View  Result Date: 05/29/2018 CLINICAL DATA:  Increased agitation over baseline. EXAM: PORTABLE CHEST 1 VIEW COMPARISON:  Chest x-ray of May 03, 2018 FINDINGS: The lungs are adequately inflated and clear. The heart and pulmonary vascularity are normal. There is calcification in the wall of the aortic arch. There is no pleural effusion. The bony thorax exhibits no acute abnormality. IMPRESSION: There is no active cardiopulmonary disease. Thoracic aortic atherosclerosis. Electronically Signed   By: David  Martinique M.D.   On: 05/29/2018 12:24    ____________________________________________   PROCEDURES  Procedure(s) performed:  Procedures  Critical Care performed:   ____________________________________________   INITIAL IMPRESSION / ASSESSMENT AND PLAN / ED COURSE          ____________________________________________   FINAL CLINICAL IMPRESSION(S) / ED DIAGNOSES  Final diagnoses:  Psychosis, unspecified psychosis type [facility])     ED Discharge Orders    None       Note:  This document was prepared using Dragon voice recognition software and may include unintentional dictation errors.     Nena Polio, MD 05/29/18 1556

## 2018-05-29 NOTE — ED Notes (Signed)
Pt refusing to allow her lab specimens to be drawn

## 2018-05-29 NOTE — ED Notes (Signed)
BEHAVIORAL HEALTH ROUNDING Patient sleeping: Yes.   Patient alert and oriented: eyes closed  Appears to be asleep Behavior appropriate: Yes.  ; If no, describe:  Nutrition and fluids offered: Yes  Toileting and hygiene offered: sleeping Sitter present: q 15 minute observations and security monitoring Law enforcement present: yes

## 2018-05-29 NOTE — ED Notes (Signed)
Staff from the group home states that patient does not have a legal guardian although our computer systems states she does.  Group home staff states her legal guardian may be patient's mother who was contacted when she was brought over here.

## 2018-05-29 NOTE — ED Notes (Addendum)
2 attempts made to get an EKG on her  - pt refusing to allow the stickers to be placed on her body  Med order received  - attempts to draw lab specimens pending medication results

## 2018-05-29 NOTE — ED Triage Notes (Signed)
Patient presents to the ED from her group home very agitated.  Staff at group home states patient is much more agitated than normal.  Patient is yelling at the top of her lungs during triage.  Per staff patient has been stating that she is dead and has been talking about blood.  Patient refused to have her temperature taken during triage.

## 2018-05-29 NOTE — ED Notes (Signed)
IVC 

## 2018-05-29 NOTE — ED Notes (Signed)
Attempt made to collect lab specimens  - pt awakened- pulled her arm away and stated  "No - I don't have any blood"   Asked if I could now perform the EKG that is ordered by the doctor and she replied  "No"

## 2018-05-29 NOTE — ED Notes (Signed)
Patient lives at [facility].  Contact number:  [phone] or [phone], staff member Maia Breslow [phone]

## 2018-05-30 ENCOUNTER — Inpatient Hospital Stay
Admission: AD | Admit: 2018-05-30 | Discharge: 2018-06-05 | DRG: 885 | Disposition: A | Discharge status: Home / Self Care | Payer: Medicare (Managed Care) | Attending: Psychiatry | Admitting: Psychiatry

## 2018-05-30 DIAGNOSIS — F203 Undifferentiated schizophrenia: Secondary | ICD-10-CM | POA: Diagnosis present

## 2018-05-30 DIAGNOSIS — Z716 Tobacco abuse counseling: Secondary | ICD-10-CM | POA: Diagnosis not present

## 2018-05-30 DIAGNOSIS — Z833 Family history of diabetes mellitus: Secondary | ICD-10-CM | POA: Diagnosis not present

## 2018-05-30 DIAGNOSIS — F209 Schizophrenia, unspecified: Secondary | ICD-10-CM | POA: Diagnosis not present

## 2018-05-30 DIAGNOSIS — Z638 Other specified problems related to primary support group: Secondary | ICD-10-CM

## 2018-05-30 DIAGNOSIS — Z888 Allergy status to other drugs, medicaments and biological substances status: Secondary | ICD-10-CM

## 2018-05-30 DIAGNOSIS — K219 Gastro-esophageal reflux disease without esophagitis: Secondary | ICD-10-CM | POA: Diagnosis present

## 2018-05-30 DIAGNOSIS — G8929 Other chronic pain: Secondary | ICD-10-CM | POA: Diagnosis present

## 2018-05-30 DIAGNOSIS — Z79899 Other long term (current) drug therapy: Secondary | ICD-10-CM | POA: Diagnosis not present

## 2018-05-30 DIAGNOSIS — R5381 Other malaise: Secondary | ICD-10-CM | POA: Diagnosis present

## 2018-05-30 DIAGNOSIS — R296 Repeated falls: Secondary | ICD-10-CM | POA: Diagnosis present

## 2018-05-30 DIAGNOSIS — R7303 Prediabetes: Secondary | ICD-10-CM | POA: Diagnosis present

## 2018-05-30 DIAGNOSIS — F17210 Nicotine dependence, cigarettes, uncomplicated: Secondary | ICD-10-CM | POA: Diagnosis present

## 2018-05-30 DIAGNOSIS — G4700 Insomnia, unspecified: Secondary | ICD-10-CM | POA: Diagnosis present

## 2018-05-30 DIAGNOSIS — Z9181 History of falling: Secondary | ICD-10-CM

## 2018-05-30 DIAGNOSIS — J449 Chronic obstructive pulmonary disease, unspecified: Secondary | ICD-10-CM | POA: Diagnosis present

## 2018-05-30 DIAGNOSIS — I10 Essential (primary) hypertension: Secondary | ICD-10-CM | POA: Diagnosis present

## 2018-05-30 DIAGNOSIS — I429 Cardiomyopathy, unspecified: Secondary | ICD-10-CM | POA: Diagnosis present

## 2018-05-30 DIAGNOSIS — M79606 Pain in leg, unspecified: Secondary | ICD-10-CM | POA: Diagnosis present

## 2018-05-30 DIAGNOSIS — R451 Restlessness and agitation: Secondary | ICD-10-CM | POA: Diagnosis not present

## 2018-05-30 DIAGNOSIS — G2401 Drug induced subacute dyskinesia: Secondary | ICD-10-CM | POA: Diagnosis present

## 2018-05-30 DIAGNOSIS — F17200 Nicotine dependence, unspecified, uncomplicated: Secondary | ICD-10-CM | POA: Diagnosis present

## 2018-05-30 MED ORDER — ACETAMINOPHEN 325 MG PO TABS
650.0000 mg | ORAL_TABLET | Freq: Four times a day (QID) | ORAL | Status: DC | PRN
Start: 2018-05-30 — End: 2018-06-05
  Administered 2018-05-30 – 2018-06-04 (×5): 650 mg via ORAL
  Filled 2018-05-30 (×5): qty 2

## 2018-05-30 MED ORDER — ALUM & MAG HYDROXIDE-SIMETH 200-200-20 MG/5ML PO SUSP
30.0000 mL | ORAL | Status: DC | PRN
  Administered 2018-06-04: 30 mL via ORAL
  Filled 2018-05-30: qty 30

## 2018-05-30 MED ORDER — FLUPHENAZINE DECANOATE 25 MG/ML IJ SOLN
25.0000 mg | Freq: Once | INTRAMUSCULAR | Status: AC
  Administered 2018-05-30: 25 mg via INTRAMUSCULAR
  Filled 2018-05-30: qty 1

## 2018-05-30 MED ORDER — TEMAZEPAM 15 MG PO CAPS
15.0000 mg | ORAL_CAPSULE | Freq: Once | ORAL | Status: AC
  Administered 2018-05-30: 15 mg via ORAL
  Filled 2018-05-30: qty 1

## 2018-05-30 MED ORDER — QUETIAPINE FUMARATE 100 MG PO TABS
100.0000 mg | ORAL_TABLET | Freq: Every day | ORAL | Status: DC
  Administered 2018-05-31 – 2018-06-01 (×2): 100 mg via ORAL
  Filled 2018-05-30 (×2): qty 1

## 2018-05-30 MED ORDER — QUETIAPINE FUMARATE 25 MG PO TABS: 100.0000 mg | ORAL_TABLET | Freq: Every day | ORAL | Status: DC

## 2018-05-30 MED ORDER — MAGNESIUM HYDROXIDE 400 MG/5ML PO SUSP: 30.0000 mL | Freq: Every day | ORAL | Status: DC | PRN

## 2018-05-30 NOTE — ED Notes (Signed)
PT  IVC  SEEN  BY  DR  [facility]  MD PENDING  PLACEMENT

## 2018-05-30 NOTE — BH Assessment (Signed)
Patient is to be admitted to [facility] by Dr. Weber Cooks.  Attending Physician will be Dr. Bary Leriche.   Patient has been assigned to room 320, by [place] Nurse [place].  ER staff is aware of the admission:  Lattie Haw, ER Dian Situ   Dr. Alfred Levins, ER MD   Amy T., Patient's Nurse   Otilio Jefferson, Patient Access.

## 2018-05-30 NOTE — ED Notes (Signed)
Report given to [facility] RN.

## 2018-05-30 NOTE — ED Notes (Signed)
No am meds ordered at this time  vss  Awaiting reevaluation from psychiatrist

## 2018-05-30 NOTE — ED Notes (Signed)
Received a call from jacquelyn from [facility] - she requested an update  - informed her that psychiatry consult is pending  She rrequests a call back with the plan of care  [phone]

## 2018-05-30 NOTE — ED Provider Notes (Signed)
ED ECG REPORT I, Rudene Re, the attending physician, personally viewed and interpreted this ECG.  Normal sinus rhythm, rate of 83, normal intervals, normal axis, no ST elevations or depressions.  Normal EKG   Alfred Levins, [place], MD 05/30/18 2212

## 2018-05-30 NOTE — ED Provider Notes (Signed)
-----------------------------------------   6:56 AM on 05/30/2018 -----------------------------------------   Blood pressure (!) 157/104, pulse (!) 107, resp. rate (!) 24, height 5' (1.524 m), weight 81.6 kg (180 lb), SpO2 100 %.  The patient had no acute events since last update.  Calm and cooperative at this time.  Disposition is pending Psychiatry/Behavioral Medicine team recommendations.     Paulette Blanch, MD 05/30/18 [phone]

## 2018-05-30 NOTE — [facility] Group Notes (Signed)
[facility] Notes:  (Nursing/MHT/Case Management/Adjunct)  Date:  05/30/2018  Time:  10:58 PM  Type of Therapy:  Group Therapy  Participation Level:  Did Not Attend   Barnie Mort 05/30/2018, 10:58 PM

## 2018-05-30 NOTE — ED Notes (Signed)
Pt given warm blanket. Continues to rest comfortably at this time. Will continue to monitor Q15 mins.

## 2018-05-30 NOTE — ED Notes (Signed)
Patient given graham crackers and water per request by this EDT.

## 2018-05-30 NOTE — Consult Note (Signed)
[facility] Consult   Reason for Consult: Follow-up consult for this 57 year old woman with schizophrenia Referring Physician: Archie Balboa Patient Identification: Gloria Perez MRN:  [id number] Principal Diagnosis: Undifferentiated schizophrenia [facility]) Diagnosis:   Patient Active Problem List   Diagnosis Date Noted  . Tobacco use disorder [F17.200] 05/07/2018  . Undifferentiated schizophrenia ([place]) [F20.3] 04/05/2018    Total Time spent with patient: 30 minutes  Subjective:   Gloria Perez is a 57 y.o. female patient admitted with patient not able to give any kind of coherent statement.  HPI: Patient reevaluated today.  Reviewed with nursing and reviewed chart.  Patient continues to be very disorganized.  Not able to give me any coherent history.  Seems to be saying that she does not want to go back to the group home but cannot answer any reasonable questions about it.  Looks agitated much of the time.  She is managing to feed herself and has not been aggressive and has basically been following instructions from nursing but still does not seem to be at her baseline.  Past Psychiatric History: Past history of psychosis just recently discharged from the hospital returned with agitation and bizarre behavior  Risk to Self: Suicidal Ideation: No Suicidal Intent: No Is patient at risk for suicide?: No Suicidal Plan?: No Access to Means: No What has been your use of drugs/alcohol within the last 12 months?: Pt denies How many times?: 0 Other Self Harm Risks: none Triggers for Past Attempts: Unknown Intentional Self Injurious Behavior: None Risk to Others: Homicidal Ideation: No Thoughts of Harm to Others: No Current Homicidal Intent: No Current Homicidal Plan: No Access to Homicidal Means: No Identified Victim: none History of harm to others?: No Assessment of Violence: None Noted Violent Behavior Description: none Does patient have access to weapons?: No Criminal  Charges Pending?: No Does patient have a court date: No Prior Inpatient Therapy: Prior Inpatient Therapy: Yes Prior Therapy Dates: 05/2018 Prior Therapy Facilty/Provider(s): [facility] Prior Outpatient Therapy: Prior Outpatient Therapy: Yes Prior Therapy Dates: Currently Prior Therapy Facilty/Provider(s): [facility] Reason for Treatment: Schizophrenia Does patient have an ACCT team?: Yes Does patient have Intensive In-House Services?  : No Does patient have [facility] services? : No Does patient have P4CC services?: No  Past Medical History:  Past Medical History:  Diagnosis Date  . Abnormal thyroid blood test   . Acute left-sided low back pain without sciatica   . Asthma   . B12 deficiency   . CHF (congestive heart failure) ([place])   . Chronic pain   . Contusion of lower back   . COPD (chronic obstructive pulmonary disease) ([place])   . Falling episodes   . GERD (gastroesophageal reflux disease)   . Hepatitis   . Hypertension   . Nonischemic cardiomyopathy ([place])   . Physical debility   . Pre-diabetes   . Schizophrenia ([place])   . Severe obesity (BMI 35.0-35.9 with comorbidity) ([place])   . Vitamin D deficiency   . Weight loss     Past Surgical History:  Procedure Laterality Date  . CESAREAN SECTION    . COLONOSCOPY    . COLONOSCOPY WITH PROPOFOL N/A 02/22/2018   Procedure: COLONOSCOPY WITH PROPOFOL;  Surgeon: [place], Benay Pike, MD;  Location: [facility] ENDOSCOPY;  Service: Gastroenterology;  Laterality: N/A;  . Right arm surgery    . TUBAL LIGATION     Family History:  Family History  Problem Relation Age of Onset  . Diabetes Mother    Family Psychiatric  History: See previous note Social History:  Social History   Substance and Sexual Activity  Alcohol Use Not Currently     Social History   Substance and Sexual Activity  Drug Use Not Currently    Social History   Socioeconomic History  . Marital status: Widowed    Spouse name: Not on file  . Number of children: Not  on file  . Years of education: Not on file  . Highest education level: Not on file  Occupational History  . Not on file  Social Needs  . Financial resource strain: Not on file  . Food insecurity:    Worry: Not on file    Inability: Not on file  . Transportation needs:    Medical: Not on file    Non-medical: Not on file  Tobacco Use  . Smoking status: Current Every Day Smoker    Packs/day: 0.50    Years: 35.00    Pack years: 17.50    Types: Cigarettes  . Smokeless tobacco: Never Used  Substance and Sexual Activity  . Alcohol use: Not Currently  . Drug use: Not Currently  . Sexual activity: Not on file  Lifestyle  . Physical activity:    Days per week: Not on file    Minutes per session: Not on file  . Stress: Not on file  Relationships  . Social connections:    Talks on phone: Not on file    Gets together: Not on file    Attends religious service: Not on file    Active member of club or organization: Not on file    Attends meetings of clubs or organizations: Not on file    Relationship status: Not on file  Other Topics Concern  . Not on file  Social History Narrative  . Not on file   Additional Social History:    Allergies:   Allergies  Allergen Reactions  . Lisinopril   . Metformin And Related     Labs:  Results for orders placed or performed during the hospital encounter of 05/29/18 (from the past 48 hour(s))  Urine Drug Screen, Qualitative     Status: Abnormal   Collection Time: 05/29/18 11:19 AM  Result Value Ref Range   Tricyclic, Ur Screen POSITIVE (A) NONE DETECTED   Amphetamines, Ur Screen NONE DETECTED NONE DETECTED   MDMA (Ecstasy)Ur Screen NONE DETECTED NONE DETECTED   Cocaine Metabolite,Ur [place] NONE DETECTED NONE DETECTED   Opiate, Ur Screen NONE DETECTED NONE DETECTED   Phencyclidine (PCP) Ur S NONE DETECTED NONE DETECTED   Cannabinoid 50 Ng, Ur [place] NONE DETECTED NONE DETECTED   Barbiturates, Ur Screen (A) NONE DETECTED    Result not  available. Reagent lot number recalled by manufacturer.   Benzodiazepine, Ur Scrn POSITIVE (A) NONE DETECTED   Methadone Scn, Ur NONE DETECTED NONE DETECTED    Comment: (NOTE) Tricyclics + metabolites, urine    Cutoff 1000 ng/mL Amphetamines + metabolites, urine  Cutoff 1000 ng/mL MDMA (Ecstasy), urine              Cutoff 500 ng/mL Cocaine Metabolite, urine          Cutoff 300 ng/mL Opiate + metabolites, urine        Cutoff 300 ng/mL Phencyclidine (PCP), urine         Cutoff 25 ng/mL Cannabinoid, urine                 Cutoff 50 ng/mL Barbiturates + metabolites, urine  Cutoff  200 ng/mL Benzodiazepine, urine              Cutoff 200 ng/mL Methadone, urine                   Cutoff 300 ng/mL The urine drug screen provides only a preliminary, unconfirmed analytical test result and should not be used for non-medical purposes. Clinical consideration and professional judgment should be applied to any positive drug screen result due to possible interfering substances. A more specific alternate chemical method must be used in order to obtain a confirmed analytical result. Gas chromatography / mass spectrometry (GC/MS) is the preferred confirmat ory method. Performed at [facility], [address] [place], [place] [address]   Pregnancy, urine     Status: None   Collection Time: 05/29/18 11:19 AM  Result Value Ref Range   Preg Test, Ur NEGATIVE NEGATIVE    Comment: Performed at [facility], [address] [place], [place] [address]  Urinalysis, Complete w Microscopic     Status: Abnormal   Collection Time: 05/29/18 11:19 AM  Result Value Ref Range   Color, Urine YELLOW (A) YELLOW   APPearance CLEAR (A) CLEAR   Specific Gravity, Urine 1.011 1.005 - 1.030   pH 6.0 5.0 - 8.0   Glucose, UA NEGATIVE NEGATIVE mg/dL   Hgb urine dipstick NEGATIVE NEGATIVE   Bilirubin Urine NEGATIVE NEGATIVE   Ketones, ur NEGATIVE NEGATIVE mg/dL   Protein, ur NEGATIVE NEGATIVE mg/dL    Nitrite NEGATIVE NEGATIVE   Leukocytes, UA NEGATIVE NEGATIVE   RBC / HPF 0-5 0 - 5 RBC/hpf   WBC, UA 0-5 0 - 5 WBC/hpf   Bacteria, UA NONE SEEN NONE SEEN   Squamous Epithelial / LPF 0-5 0 - 5    Comment: Performed at [facility], [address] [place], [place] [address]  Comprehensive metabolic panel     Status: Abnormal   Collection Time: 05/29/18  3:34 PM  Result Value Ref Range   Sodium 141 135 - 145 mmol/L   Potassium 3.1 (L) 3.5 - 5.1 mmol/L   Chloride 106 98 - 111 mmol/L   CO2 28 22 - 32 mmol/L   Glucose, Bld 85 70 - 99 mg/dL   BUN 8 6 - 20 mg/dL   Creatinine, Ser 0.67 0.44 - 1.00 mg/dL   Calcium 8.9 8.9 - 10.3 mg/dL   Total Protein 6.5 6.5 - 8.1 g/dL   Albumin 3.6 3.5 - 5.0 g/dL   AST 34 15 - 41 U/L   ALT 27 0 - 44 U/L   Alkaline Phosphatase 61 38 - 126 U/L   Total Bilirubin 0.6 0.3 - 1.2 mg/dL   GFR calc non Af Amer >60 >60 mL/min   GFR calc Af Amer >60 >60 mL/min    Comment: (NOTE) The eGFR has been calculated using the CKD EPI equation. This calculation has not been validated in all clinical situations. eGFR's persistently <60 mL/min signify possible Chronic Kidney Disease.    Anion gap 7 5 - 15    Comment: Performed at [facility], [address] [place], [place] [address]  Ethanol     Status: None   Collection Time: 05/29/18  3:34 PM  Result Value Ref Range   Alcohol, Ethyl (B) <10 <10 mg/dL    Comment: (NOTE) Lowest detectable limit for serum alcohol is 10 mg/dL. For medical purposes only. Performed at [facility], [address] [place], [place] [address]   Salicylate  level     Status: None   Collection Time: 05/29/18  3:34 PM  Result Value Ref Range   Salicylate Lvl <8.4 2.8 - 30.0 mg/dL    Comment: Performed at [facility], [address] [place], [place] [address]  Acetaminophen level     Status: Abnormal   Collection Time: 05/29/18  3:34 PM  Result Value Ref Range   Acetaminophen (Tylenol), Serum  <10 (L) 10 - 30 ug/mL    Comment: (NOTE) Therapeutic concentrations vary significantly. A range of 10-30 ug/mL  may be an effective concentration for many patients. However, some  are best treated at concentrations outside of this range. Acetaminophen concentrations >150 ug/mL at 4 hours after ingestion  and >50 ug/mL at 12 hours after ingestion are often associated with  toxic reactions. Performed at [facility], [address] [place], [place] [address]   cbc     Status: Abnormal   Collection Time: 05/29/18  3:34 PM  Result Value Ref Range   WBC 4.7 3.6 - 11.0 K/uL   RBC 3.84 3.80 - 5.20 MIL/uL   Hemoglobin 11.9 (L) 12.0 - 16.0 g/dL   HCT 33.9 (L) 35.0 - 47.0 %   MCV 88.4 80.0 - 100.0 fL   MCH 31.0 26.0 - 34.0 pg   MCHC 35.1 32.0 - 36.0 g/dL   RDW 14.2 11.5 - 14.5 %   Platelets 236 150 - 440 K/uL    Comment: Performed at [facility], [address] [place], [place] [address]  Troponin I     Status: None   Collection Time: 05/29/18  3:34 PM  Result Value Ref Range   Troponin I <0.03 <0.03 ng/mL    Comment: Performed at [facility], [address] [place], [place] [address]    Current Facility-Administered Medications  Medication Dose Route Frequency Provider Last Rate Last Dose  . fluPHENAZine decanoate (PROLIXIN) injection 25 mg  25 mg Intramuscular Once Reola Buckles T, MD      . QUEtiapine (SEROQUEL) tablet 100 mg  100 mg Oral QHS Williemae Muriel, Madie Reno, MD       Current Outpatient Medications  Medication Sig Dispense Refill  . Cholecalciferol 1000 units tablet Take 1 tablet (1,000 Units total) by mouth daily. 30 tablet 1  . diphenhydrAMINE (BENADRYL) 25 mg capsule Take 1 capsule (25 mg total) by mouth 3 (three) times daily. 90 capsule 1  . fluPHENAZine decanoate (PROLIXIN) 25 MG/ML injection Inject 0.8 mLs (20 mg total) into the muscle every 21 ( twenty-one) days. Next injectio on 05/28/2018 5 mL 1  . ibuprofen (ADVIL,MOTRIN) 800 MG tablet  Take 800 mg by mouth 3 (three) times daily as needed for mild pain or moderate pain.    . naproxen (NAPROSYN) 500 MG tablet Take 500 mg by mouth every 12 (twelve) hours as needed for mild pain or moderate pain.    . nicotine (NICODERM CQ - DOSED IN MG/24 HOURS) 14 mg/24hr patch Place 14 mg onto the skin daily.    Marland Kitchen omeprazole (PRILOSEC) 20 MG capsule Take 1 capsule (20 mg total) by mouth daily. 30 capsule 1  . QUEtiapine (SEROQUEL) 50 MG tablet Take 1 tablet (50 mg total) by mouth at bedtime. 30 tablet 1  . tiotropium (SPIRIVA) 18 MCG inhalation capsule Place 1 capsule (18 mcg total) into inhaler and inhale daily. 30 capsule 12    Musculoskeletal: Strength & Muscle Tone: within normal limits Gait & Station: normal Patient leans: N/A  Psychiatric  Specialty Exam: Physical Exam  Nursing note and vitals reviewed. Constitutional: She appears well-developed and well-nourished.  HENT:  Head: Normocephalic and atraumatic.  Eyes: Pupils are equal, round, and reactive to light. Conjunctivae are normal.  Neck: Normal range of motion.  Cardiovascular: Regular rhythm and normal heart sounds.  Respiratory: Effort normal. No respiratory distress.  GI: Soft.  Musculoskeletal: Normal range of motion.  Neurological: She is alert.  Skin: Skin is warm and dry.  Psychiatric: Her mood appears anxious. Her affect is labile. Her speech is tangential. She is agitated. She is not aggressive. Thought content is paranoid. Cognition and memory are impaired. She expresses impulsivity and inappropriate judgment. She expresses no homicidal and no suicidal ideation. She is noncommunicative.    Review of Systems  Unable to perform ROS: Psychiatric disorder    Blood pressure (!) 144/82, pulse 89, temperature 98.1 F (36.7 C), resp. rate 18, height 5' (1.524 m), weight 81.6 kg (180 lb), SpO2 98 %.Body mass index is 35.15 kg/m.  General Appearance: Disheveled  Eye Contact:  Minimal  Speech:  Slow  Volume:   Decreased  Mood:  Dysphoric  Affect:  Depressed  Thought Process:  Disorganized  Orientation:  Negative  Thought Content:  Negative  Suicidal Thoughts:  No  Homicidal Thoughts:  No  Memory:  Negative  Judgement:  Negative  Insight:  Negative  Psychomotor Activity:  Negative  Concentration:  Concentration: Negative  Recall:  Poor  Fund of Knowledge:  Poor  Language:  Poor  Akathisia:  No  Handed:  Right  AIMS (if indicated):     Assets:  Housing  ADL's:  Impaired  Cognition:  Impaired,  Moderate  Sleep:        Treatment Plan Summary: Medication management and Plan Patient will be given her Prolixin Decanoate shot.  I doubt very much that it had been given as a follow-up outside the hospital because of the timing of her return.  Patient remains still very psychotic and does not look like she is on her medicine.  We will plan at this point to readmit her to the hospital.  She is to psychotic and agitated to be discharged back to her group home in the current condition.  Reviewed with TTS orders will be placed.  Disposition: Recommend psychiatric Inpatient admission when medically cleared.  Alethia Berthold, MD 05/30/2018 5:28 PM

## 2018-05-31 ENCOUNTER — Encounter: Payer: Self-pay | Admitting: Psychiatry

## 2018-05-31 ENCOUNTER — Other Ambulatory Visit: Payer: Self-pay

## 2018-05-31 DIAGNOSIS — F203 Undifferentiated schizophrenia: Principal | ICD-10-CM

## 2018-05-31 NOTE — Tx Team (Signed)
Initial Treatment Plan 05/31/2018 2:50 AM [name] [name] [id number]    PATIENT STRESSORS: Financial difficulties Medication change or noncompliance   PATIENT STRENGTHS: Motivation for treatment/growth Supportive family/friends   PATIENT IDENTIFIED PROBLEMS: Non-compliance with medications  Pt left group home                   DISCHARGE CRITERIA:  Adequate post-discharge living arrangements Improved stabilization in mood, thinking, and/or behavior  PRELIMINARY DISCHARGE PLAN: Attend aftercare/continuing care group Outpatient therapy  PATIENT/FAMILY INVOLVEMENT: This treatment plan has been presented to and reviewed with the patient, Gloria Perez.  The patient have been given the opportunity to ask questions and make suggestions.  Hessie Diener, RN 05/31/2018, 2:50 AM

## 2018-05-31 NOTE — Progress Notes (Signed)
Recreation Therapy Notes  Date: 05/31/2018  Time: 9:30 am   Location: Craft Room   Behavioral response: N/A   Intervention Topic: Communication  Discussion/Intervention: Patient did not attend group.   Clinical Observations/Feedback:  Patient did not attend group.   Floretta Petro LRT/CTRS         Ether Goebel 05/31/2018 11:49 AM

## 2018-05-31 NOTE — [facility] Suicide Risk Assessment (Signed)
[facility] Admission Suicide Risk Assessment   Nursing information obtained from:  Patient Demographic factors:  Low socioeconomic status, Unemployed Current Mental Status:  NA Loss Factors:  Decline in physical health Historical Factors:  Prior suicide attempts Risk Reduction Factors:  NA  Total Time spent with patient: 1 hour Principal Problem: Undifferentiated schizophrenia ([place]) Diagnosis:   Patient Active Problem List   Diagnosis Date Noted  . Undifferentiated schizophrenia ([place]) [F20.3] 04/05/2018    Priority: High  . Tobacco use disorder [F17.200] 05/07/2018   Subjective Data: psychotic break  Continued Clinical Symptoms:  Alcohol Use Disorder Identification Test Final Score (AUDIT): 0 The "Alcohol Use Disorders Identification Test", Guidelines for Use in Primary Care, Second Edition.  World Pharmacologist [facility]). Score between 0-7:  no or low risk or alcohol related problems. Score between 8-15:  moderate risk of alcohol related problems. Score between 16-19:  high risk of alcohol related problems. Score 20 or above:  warrants further diagnostic evaluation for alcohol dependence and treatment.   CLINICAL FACTORS:   Schizophrenia:   Paranoid or undifferentiated type   Musculoskeletal: Strength & Muscle Tone: within normal limits Gait & Station: normal Patient leans: N/A  Psychiatric Specialty Exam: Physical Exam  Nursing note and vitals reviewed. Psychiatric: Her affect is blunt and inappropriate. Her speech is delayed. She is slowed, withdrawn and actively hallucinating. Thought content is paranoid. Cognition and memory are impaired. She expresses impulsivity.    Review of Systems  Neurological: Negative.   Psychiatric/Behavioral: Positive for hallucinations.  All other systems reviewed and are negative.   Blood pressure (!) 156/99, pulse 85, temperature 98.1 F (36.7 C), temperature source Oral, resp. rate 20, height 5' (1.524 m), weight 66.7 kg (147 lb 0.8  oz), SpO2 100 %.Body mass index is 28.72 kg/m.  General Appearance: Casual  Eye Contact:  Poor  Speech:  Blocked and Slow  Volume:  Decreased  Mood:  Euthymic  Affect:  Blunt  Thought Process:  Irrelevant and Descriptions of Associations: Loose  Orientation:  Negative  Thought Content:  Delusions, Hallucinations: Auditory and Paranoid Ideation  Suicidal Thoughts:  No  Homicidal Thoughts:  No  Memory:  Immediate;   Poor Recent;   Poor Remote;   Poor  Judgement:  Poor  Insight:  Lacking  Psychomotor Activity:  Psychomotor Retardation  Concentration:  Concentration: Poor and Attention Span: Poor  Recall:  Poor  Fund of Knowledge:  Poor  Language:  Poor  Akathisia:  No  Handed:  Right  AIMS (if indicated):     Assets:  Communication Skills Desire for Improvement Financial Resources/Insurance Housing Physical Health Resilience Social Support  ADL's:  Intact  Cognition:  WNL  Sleep:  Number of Hours: 5.3      COGNITIVE FEATURES THAT CONTRIBUTE TO RISK:  None    SUICIDE RISK:   Minimal: No identifiable suicidal ideation.  Patients presenting with no risk factors but with morbid ruminations; may be classified as minimal risk based on the severity of the depressive symptoms  PLAN OF CARE: hospital admission, medication management, discharge planning.  Gloria Perez is a 57 year old female with a history of schizophrenia maintained on Prolixin injections returns to the hospital with agitation.  #Agitation, resolved  #Psychosis -unclear if the patient received Prolixin decanoate in the community -given 25 mg of prolixin in the ER on 7/23 -received 50 mg of prolixin decanoate here on 05/08/2018. She usually gets 18.5 mg of Prolixin every 3 weeks due to EPS. It may be beneficial to increase  Prolixin dose and start Ingrezza in the community. At times, patient shakes violently unable to feed herself -alternatively we could use Clozapine but compliance is a  problem  #Labs -performed recently  #Disposition -[facility] -follow up with [facility] team  I certify that inpatient services furnished can reasonably be expected to improve the patient's condition.   Gloria Slick, MD 05/31/2018, 2:33 PM

## 2018-05-31 NOTE — Plan of Care (Signed)
Patient pleasant and cooperative with this Probation officer.  Patient attended treatment team meeting.  During meeting patient selectively mute.  Would not respond to doctor, avoided eye contact.  Utilizing walker.  Later during day patient walking without walker, bright affect.  Up to dayroom with peers watching TV.  Support offered.  Safety maintained.

## 2018-05-31 NOTE — Progress Notes (Signed)
Notified Dr. Weber Cooks about pt's blood pressure this morning 156/99, 85. No treatments recommended at this time.

## 2018-05-31 NOTE — [facility] Group Notes (Signed)
[facility] Group Notes:  (Nursing/MHT/Case Management/Adjunct)  Date:  05/31/2018  Time:  6:33 PM  Type of Therapy:  Psychoeducational Skills  Participation Level:  Did Not Attend   Adela Lank [employer] 05/31/2018, 6:33 PM

## 2018-05-31 NOTE — Tx Team (Signed)
Interdisciplinary Treatment and Diagnostic Plan Update  05/31/2018 Time of Session: 10:45 AM Gloria Perez MRN: [id number]  Principal Diagnosis: Undifferentiated schizophrenia 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 acetaminophen (TYLENOL) tablet 650 mg  650 mg Oral Q6H PRN Clapacs, Madie Ren[place], MD   650 mg at 05/30/18 2307  . alum & mag hydroxide-simeth (MAALOX/MYLANTA) 200-200-20 MG/5ML suspension 30 mL  30 mL Oral Q4H PRN Clapacs, John T, MD      . magnesium hydroxide (MILK OF MAGNESIA) suspension 30 mL  30 mL Oral Daily PRN Clapacs, John T, MD      . QUEtiapine (SEROQUEL) tablet 100 mg  100 mg Oral QHS Clapacs, John T, MD       PTA Medications: Medications Prior to Admission  Medication Sig Dispense Refill Last Dose  . Cholecalciferol 1000 units tablet Take 1 tablet (1,000 Units total) by mouth daily. 30 tablet 1 05/30/2018 at 0800  . diphenhydrAMINE (BENADRYL) 25 mg capsule Take 1 capsule (25 mg total) by mouth 3 (three) times daily. 90 capsule 1 05/30/2018 at 0800  . fluPHENAZine decanoate (PROLIXIN) 25 MG/ML injection Inject 0.8 mLs (20 mg total) into the muscle every 21 ( twenty-one) days. Next injectio on 05/28/2018 5 mL 1 As directed at As directed  . ibuprofen (ADVIL,MOTRIN) 800 MG tablet Take 800 mg by mouth 3 (three) times daily as needed for mild pain or moderate pain.   PRN at PRN  . naproxen (NAPROSYN) 500 MG tablet Take 500 mg by mouth every 12 (twelve) hours as needed for mild pain or moderate pain.   PRN at PRN  . nicotine (NICODERM CQ - DOSED IN MG/24 HOURS) 14 mg/24hr patch Place 14 mg onto the skin daily.   05/30/2018 at 0800  . omeprazole (PRILOSEC) 20 MG capsule Take 1 capsule (20 mg total) by mouth daily. 30 capsule 1 05/30/2018 at 0800  . QUEtiapine  (SEROQUEL) 50 MG tablet Take 1 tablet (50 mg total) by mouth at bedtime. 30 tablet 1 05/29/2018 at 2000  . tiotropium (SPIRIVA) 18 MCG inhalation capsule Place 1 capsule (18 mcg total) into inhaler and inhale daily. 30 capsule 12 05/30/2018 at 0800    Patient Stressors: Financial difficulties Medication change or noncompliance  Patient Strengths: Motivation for treatment/growth Supportive family/friends  Treatment Modalities: Medication Management, Group therapy, Case management,  1 to 1 session with clinician, Psychoeducation, Recreational therapy.   Physician Treatment Plan for Primary Diagnosis: Undifferentiated schizophrenia ([place]) Long Term Goal(s): Improvement in symptoms so as ready for discharge NA   Short Term Goals: Ability to identify changes in lifestyle to reduce recurrence of condition will improve Ability to verbalize feelings will improve Ability to disclose and discuss suicidal ideas Ability to demonstrate self-control will improve Ability to identify and develop effective coping behaviors will improve Ability to maintain clinical measurements within normal limits will improve Compliance with prescribed medications will improve Ability to identify triggers associated with substance abuse/mental health issues will improve NA  Medication Management: Evaluate patient's response, side effects, and tolerance of medication regimen.  Therapeutic Interventions: 1 to 1 sessions, Unit Group sessions and Medication administration.  Evaluation of Outcomes: Not Progressing  Physician Treatment Plan for Secondary Diagnosis: Principal Problem:   Undifferentiated schizophrenia ([place]) Active Problems:   Tobacco use disorder  Long Term Goal(s): Improvement in symptoms so  as ready for discharge NA   Short Term Goals: Ability to identify changes in lifestyle to reduce recurrence of condition will improve Ability to verbalize feelings will improve Ability to disclose and discuss  suicidal ideas Ability to demonstrate self-control will improve Ability to identify and develop effective coping behaviors will improve Ability to maintain clinical measurements within normal limits will improve Compliance with prescribed medications will improve Ability to identify triggers associated with substance abuse/mental health issues will improve NA     Medication Management: Evaluate patient's response, side effects, and tolerance of medication regimen.  Therapeutic Interventions: 1 to 1 sessions, Unit Group sessions and Medication administration.  Evaluation of Outcomes: Progressing   RN Treatment Plan for Primary Diagnosis: Undifferentiated schizophrenia ([place]) Long Term Goal(s): Knowledge of disease and therapeutic regimen to maintain health will improve  Short Term Goals: Ability to remain free from injury will improve, Ability to participate in decision making will improve, Ability to verbalize feelings will improve and Compliance with prescribed medications will improve  Medication Management: RN will administer medications as ordered by provider, will assess and evaluate patient's response and provide education to patient for prescribed medication. RN will report any adverse and/or side effects to prescribing provider.  Therapeutic Interventions: 1 on 1 counseling sessions, Psychoeducation, Medication administration, Evaluate responses to treatment, Monitor vital signs and CBGs as ordered, Perform/monitor CIWA, COWS, AIMS and Fall Risk screenings as ordered, Perform wound care treatments as ordered.  Evaluation of Outcomes: Not Progressing   LCSW Treatment Plan for Primary Diagnosis: Undifferentiated schizophrenia ([place]) Long Term Goal(s): Safe transition to appropriate next level of care at discharge, Engage patient in therapeutic group addressing interpersonal concerns.  Short Term Goals: Engage patient in aftercare planning with referrals and resources and Increase  skills for wellness and recovery  Therapeutic Interventions: Assess for all discharge needs, 1 to 1 time with Social worker, Explore available resources and support systems, Assess for adequacy in community support network, Educate family and significant other(s) on suicide prevention, Complete Psychosocial Assessment, Interpersonal group therapy.  Evaluation of Outcomes: Not Progressing   Progress in Treatment: Attending groups: Yes. Participating in groups: No. Taking medication as prescribed: Yes. Toleration medication: Yes. Family/Significant other contact made: No, will contact:  CSW will contact identified support person when given consent. Patient understands diagnosis: Yes. Discussing patient identified problems/goals with staff: No. Medical problems stabilized or resolved: Yes. Denies suicidal/homicidal ideation: Yes. Issues/concerns per patient self-inventory: No. Other: n/a  New problem(s) identified: No, Describe:  No new problems identified  New Short Term/Long Term Goal(s):  Patient Goals:  Pt came to tx team, but refused to speak.  Discharge Plan or Barriers: Tentative plan is for pt to return to group home and resume ACT services with [facility]  Reason for Continuation of Hospitalization: Hallucinations Medication stabilization  Estimated Length of Stay: 5-7 days  Attendees: Patient: Gloria Perez 05/31/2018 4:02 PM  Physician: Orson Slick, MD 05/31/2018 4:02 PM  Nursing: Elige Radon, RN 05/31/2018 4:02 PM  RN Care Manager: 05/31/2018 4:02 PM  Social Worker: Derrek Gu, LCSW 05/31/2018 4:02 PM  Recreational Therapist: Roanna Epley, LRT 05/31/2018 4:02 PM  Other: Alden Hipp, LCSW 05/31/2018 4:02 PM  Other: Darin Engels, [place] 05/31/2018 4:02 PM  Other: Marney Doctor, Chaplain 05/31/2018 4:02 PM    Scribe for Treatment Team: Devona Konig, LCSW 05/31/2018 4:02 PM

## 2018-05-31 NOTE — Plan of Care (Signed)
Pt not progressing. 

## 2018-05-31 NOTE — Progress Notes (Signed)
Pt received from Emergency Room. Pt's skin assessment completed with Normajean Baxter. Skin is intact, no contraband found. Pt was admitted under the services of [place]. Pt cooperative and was able to answer most of the admission questions. Pt denies SI/HI. Pt taken straight to her room, being she was unsteady on her feet deliberately. Doctor called for sleep medication and Restoril 15 mg was ordered. Pt is receptive to treatment and safety maintained on unit. Will continue to monitor.

## 2018-05-31 NOTE — [facility] Group Notes (Signed)
LCSW Group Therapy Note  05/31/2018 1:00 pm  Type of Therapy/Topic:  Group Therapy:  Emotion Regulation  Participation Level:  None   Description of Group:    The purpose of this group is to assist patients in learning to regulate negative emotions and experience positive emotions. Patients will be guided to discuss ways in which they have been vulnerable to their negative emotions. These vulnerabilities will be juxtaposed with experiences of positive emotions or situations, and patients will be challenged to use positive emotions to combat negative ones. Special emphasis will be placed on coping with negative emotions in conflict situations, and patients will process healthy conflict resolution skills.  Therapeutic Goals: 1. Patient will identify two positive emotions or experiences to reflect on in order to balance out negative emotions 2. Patient will label two or more emotions that they find the most difficult to experience 3. Patient will demonstrate positive conflict resolution skills through discussion and/or role plays  Summary of Patient Progress:  Gloria Perez initially presented to group, but did not participate in any of the group discussions.  She did not speak at all during group even when CSW asked her to confirm her name.  Gloria Perez did not stay in group very long.     Therapeutic Modalities:   Cognitive Behavioral Therapy Feelings Identification Dialectical Behavioral Therapy

## 2018-05-31 NOTE — H&P (Addendum)
Psychiatric Admission Assessment Adult  Patient Identification: Gloria Perez MRN:  [id number] Date of Evaluation:  05/31/2018 Chief Complaint:  Schizophrenia Principal Diagnosis: Undifferentiated schizophrenia ([place]) Diagnosis:   Patient Active Problem List   Diagnosis Date Noted  . Undifferentiated schizophrenia ([place]) [F20.3] 04/05/2018    Priority: High  . Tobacco use disorder [F17.200] 05/07/2018   History of Present Illness:   Identifyin data. Gloria Perez is a 57 year old female with a history of schizophrenia.  Chief complaint. "It is cold in my room."  History pf present illness. Information was obtained from the patient and the chart. The patient was discharge from [facility] about one month ago and has been doing fine at her group home. Change in behavior, with agitation and yelling, appened so suddenly that her caregivers suspected substance use. The patient was only positive for benzodiazepines, likely given in the ER. Of note, she is positive fro tricyclics that I do not see on her medication list.   The patient heself, denies any symptoms of depression, anxiety or psychosis. She is not suicidal or homicidal. There is poverty of thought and speech. When invited to to treatment team. She did not say a word.  Past psychiatric history.  Long history of mental illness with multiple admissions and medication trials. Denies suicide attempts but chart review suggests otherwise. In the care of [facility] team stable on Prolixin decanoate injections. She receives low dose Prolixin decanoate 18.5 mg every 3 weeks. She was given 50 mg of Prolixin decanoate during previous hospitalization. She received 18.5 mg from her ACT team on 7/18 and 25 mg in the ER on 7/23. Low dose of Prolixin was likely chosen due to tardive dyskinesia. Indeed, the patient experiences occasional tremors so severe that she is unable to feed herself.   Family psychiatric history. None.  Social history. She resides at the  [facility] in [place]. She has 4 sisters, 4 children and 8 grandchildren. She is estranged from her family. She was placed at the facility after her husband passed away.   Total Time spent with patient: 1 hour  Is the patient at risk to self? No.  Has the patient been a risk to self in the past 6 months? No.  Has the patient been a risk to self within the distant past? No.  Is the patient a risk to others? No.  Has the patient been a risk to others in the past 6 months? No.  Has the patient been a risk to others within the distant past? No.   Prior Inpatient Therapy:   Prior Outpatient Therapy:    Alcohol Screening: 1. How often do you have a drink containing alcohol?: Never 2. How many drinks containing alcohol do you have on a typical day when you are drinking?: 1 or 2 3. How often do you have six or more drinks on one occasion?: Never AUDIT-C Score: 0 4. How often during the last year have you found that you were not able to stop drinking once you had started?: Never 5. How often during the last year have you failed to do what was normally expected from you becasue of drinking?: Never 6. How often during the last year have you needed a first drink in the morning to get yourself going after a heavy drinking session?: Never 7. How often during the last year have you had a feeling of guilt of remorse after drinking?: Never 8. How often during the last year have you been unable to  remember what happened the night before because you had been drinking?: Never 9. Have you or someone else been injured as a result of your drinking?: No 10. Has a relative or friend or a doctor or another health worker been concerned about your drinking or suggested you cut down?: No Alcohol Use Disorder Identification Test Final Score (AUDIT): 0 Intervention/Follow-up: AUDIT Score <7 follow-up not indicated Substance Abuse History in the last 12 months:  No. Consequences of Substance  Abuse: NA Previous Psychotropic Medications: Yes  Psychological Evaluations: No  Past Medical History:  Past Medical History:  Diagnosis Date  . Abnormal thyroid blood test   . Acute left-sided low back pain without sciatica   . Asthma   . B12 deficiency   . CHF (congestive heart failure) ([place])   . Chronic pain   . Contusion of lower back   . COPD (chronic obstructive pulmonary disease) ([place])   . Falling episodes   . GERD (gastroesophageal reflux disease)   . Hepatitis   . Hypertension   . Nonischemic cardiomyopathy ([place])   . Physical debility   . Pre-diabetes   . Schizophrenia ([place])   . Severe obesity (BMI 35.0-35.9 with comorbidity) ([place])   . Vitamin D deficiency   . Weight loss     Past Surgical History:  Procedure Laterality Date  . CESAREAN SECTION    . COLONOSCOPY    . COLONOSCOPY WITH PROPOFOL N/A 02/22/2018   Procedure: COLONOSCOPY WITH PROPOFOL;  Surgeon: Toledo, Benay Pike, MD;  Location: [facility] ENDOSCOPY;  Service: Gastroenterology;  Laterality: N/A;  . Right arm surgery    . TUBAL LIGATION     Family History:  Family History  Problem Relation Age of Onset  . Diabetes Mother     Tobacco Screening: Have you used any form of tobacco in the last 30 days? (Cigarettes, Smokeless Tobacco, Cigars, and/or Pipes): Yes Tobacco use, Select all that apply: 5 or more cigarettes per day Are you interested in Tobacco Cessation Medications?: No, patient refused Counseled patient on smoking cessation including recognizing danger situations, developing coping skills and basic information about quitting provided: Refused/Declined practical counseling Social History:  Social History   Substance and Sexual Activity  Alcohol Use Not Currently     Social History   Substance and Sexual Activity  Drug Use Not Currently    Additional Social History:                           Allergies:   Allergies  Allergen Reactions  . Lisinopril   . Metformin And Related     Lab Results:  Results for orders placed or performed during the hospital encounter of 05/29/18 (from the past 48 hour(s))  Comprehensive metabolic panel     Status: Abnormal   Collection Time: 05/29/18  3:34 PM  Result Value Ref Range   Sodium 141 135 - 145 mmol/L   Potassium 3.1 (L) 3.5 - 5.1 mmol/L   Chloride 106 98 - 111 mmol/L   CO2 28 22 - 32 mmol/L   Glucose, Bld 85 70 - 99 mg/dL   BUN 8 6 - 20 mg/dL   Creatinine, Ser 0.67 0.44 - 1.00 mg/dL   Calcium 8.9 8.9 - 10.3 mg/dL   Total Protein 6.5 6.5 - 8.1 g/dL   Albumin 3.6 3.5 - 5.0 g/dL   AST 34 15 - 41 U/L   ALT 27 0 - 44 U/L   Alkaline Phosphatase 61 38 -  126 U/L   Total Bilirubin 0.6 0.3 - 1.2 mg/dL   GFR calc non Af Amer >60 >60 mL/min   GFR calc Af Amer >60 >60 mL/min    Comment: (NOTE) The eGFR has been calculated using the CKD EPI equation. This calculation has not been validated in all clinical situations. eGFR's persistently <60 mL/min signify possible Chronic Kidney Disease.    Anion gap 7 5 - 15    Comment: Performed at [facility], [address] [place], [place] [address]  Ethanol     Status: None   Collection Time: 05/29/18  3:34 PM  Result Value Ref Range   Alcohol, Ethyl (B) <10 <10 mg/dL    Comment: (NOTE) Lowest detectable limit for serum alcohol is 10 mg/dL. For medical purposes only. Performed at [facility], [address] [place], [place] [address]   Salicylate level     Status: None   Collection Time: 05/29/18  3:34 PM  Result Value Ref Range   Salicylate Lvl <3.5 2.8 - 30.0 mg/dL    Comment: Performed at [facility], [address] [place], [place] [address]  Acetaminophen level     Status: Abnormal   Collection Time: 05/29/18  3:34 PM  Result Value Ref Range   Acetaminophen (Tylenol), Serum <10 (L) 10 - 30 ug/mL    Comment: (NOTE) Therapeutic concentrations vary significantly. A range of 10-30 ug/mL  may be an effective concentration for many  patients. However, some  are best treated at concentrations outside of this range. Acetaminophen concentrations >150 ug/mL at 4 hours after ingestion  and >50 ug/mL at 12 hours after ingestion are often associated with  toxic reactions. Performed at [facility], [address] [place], [place] [address]   cbc     Status: Abnormal   Collection Time: 05/29/18  3:34 PM  Result Value Ref Range   WBC 4.7 3.6 - 11.0 K/uL   RBC 3.84 3.80 - 5.20 MIL/uL   Hemoglobin 11.9 (L) 12.0 - 16.0 g/dL   HCT 33.9 (L) 35.0 - 47.0 %   MCV 88.4 80.0 - 100.0 fL   MCH 31.0 26.0 - 34.0 pg   MCHC 35.1 32.0 - 36.0 g/dL   RDW 14.2 11.5 - 14.5 %   Platelets 236 150 - 440 K/uL    Comment: Performed at [facility], [address] [place], [place] [address]  Troponin I     Status: None   Collection Time: 05/29/18  3:34 PM  Result Value Ref Range   Troponin I <0.03 <0.03 ng/mL    Comment: Performed at [facility], [address] [place], [place] [address]    Blood Alcohol level:  Lab Results  Component Value Date   [facility] <10 05/29/2018   ETH <10 81/11/7508    Metabolic Disorder Labs:  Lab Results  Component Value Date   HGBA1C 4.8 05/04/2018   MPG 91.06 05/04/2018   MPG 88.19 05/02/2018   No results found for: PROLACTIN Lab Results  Component Value Date   CHOL 189 05/04/2018   TRIG 136 05/04/2018   HDL 79 05/04/2018   CHOLHDL 2.4 05/04/2018   VLDL 27 05/04/2018   LDLCALC 83 05/04/2018   LDLCALC 58 08/17/2017    Current Medications: Current Facility-Administered Medications  Medication Dose Route Frequency Provider Last Rate Last Dose  . acetaminophen (TYLENOL) tablet 650 mg  650 mg Oral Q6H PRN Clapacs, Madie Reno, MD   650 mg at 05/30/18 2307  .  alum & mag hydroxide-simeth (MAALOX/MYLANTA) 200-200-20 MG/5ML suspension 30 mL  30 mL Oral Q4H PRN Clapacs, John T, MD      . magnesium hydroxide (MILK OF MAGNESIA) suspension 30 mL  30 mL Oral Daily PRN Clapacs, John  T, MD      . QUEtiapine (SEROQUEL) tablet 100 mg  100 mg Oral QHS Clapacs, John T, MD       PTA Medications: Medications Prior to Admission  Medication Sig Dispense Refill Last Dose  . Cholecalciferol 1000 units tablet Take 1 tablet (1,000 Units total) by mouth daily. 30 tablet 1 05/30/2018 at 0800  . diphenhydrAMINE (BENADRYL) 25 mg capsule Take 1 capsule (25 mg total) by mouth 3 (three) times daily. 90 capsule 1 05/30/2018 at 0800  . fluPHENAZine decanoate (PROLIXIN) 25 MG/ML injection Inject 0.8 mLs (20 mg total) into the muscle every 21 ( twenty-one) days. Next injectio on 05/28/2018 5 mL 1 As directed at As directed  . ibuprofen (ADVIL,MOTRIN) 800 MG tablet Take 800 mg by mouth 3 (three) times daily as needed for mild pain or moderate pain.   PRN at PRN  . naproxen (NAPROSYN) 500 MG tablet Take 500 mg by mouth every 12 (twelve) hours as needed for mild pain or moderate pain.   PRN at PRN  . nicotine (NICODERM CQ - DOSED IN MG/24 HOURS) 14 mg/24hr patch Place 14 mg onto the skin daily.   05/30/2018 at 0800  . omeprazole (PRILOSEC) 20 MG capsule Take 1 capsule (20 mg total) by mouth daily. 30 capsule 1 05/30/2018 at 0800  . QUEtiapine (SEROQUEL) 50 MG tablet Take 1 tablet (50 mg total) by mouth at bedtime. 30 tablet 1 05/29/2018 at 2000  . tiotropium (SPIRIVA) 18 MCG inhalation capsule Place 1 capsule (18 mcg total) into inhaler and inhale daily. 30 capsule 12 05/30/2018 at 0800    Musculoskeletal: Strength & Muscle Tone: within normal limits Gait & Station: normal Patient leans: N/A  Psychiatric Specialty Exam: Physical Exam  Nursing note and vitals reviewed. Constitutional: She is oriented to person, place, and time. She appears well-developed and well-nourished.  HENT:  Head: Normocephalic and atraumatic.  Eyes: Pupils are equal, round, and reactive to light. Conjunctivae and EOM are normal.  Neck: Normal range of motion. Neck supple.  Cardiovascular: Normal rate and regular rhythm.   Respiratory: Effort normal and breath sounds normal.  GI: Soft. Bowel sounds are normal.  Musculoskeletal: Normal range of motion.  Neurological: She is alert and oriented to person, place, and time.  Skin: Skin is warm and dry.  Psychiatric: Her affect is blunt and inappropriate. Her speech is delayed. She is slowed, withdrawn and actively hallucinating. Thought content is paranoid and delusional. Cognition and memory are impaired. She expresses impulsivity.    Review of Systems  Neurological: Negative.   Psychiatric/Behavioral: Positive for hallucinations and memory loss.  All other systems reviewed and are negative.   Blood pressure (!) 156/99, pulse 85, temperature 98.1 F (36.7 C), temperature source Oral, resp. rate 20, height 5' (1.524 m), weight 66.7 kg (147 lb 0.8 oz), SpO2 100 %.Body mass index is 28.72 kg/m.  See SRA                                                  Sleep:  Number of Hours: 5.3    Treatment Plan Summary:  Daily contact with patient to assess and evaluate symptoms and progress in treatment and Medication management   Ms. Banner is a 57 year old female with a history of schizophrenia maintained on Prolixin injections returns to the hospital with agitation.  #Agitation, resolved  #Psychosis -unclear if the patient received Prolixin decanoate in the community -given 25 mg of prolixin in the ER on 7/23 -received 50 mg of prolixin decanoate here on 05/08/2018. She usually gets 18.5 mg of Prolixin every 3 weeks due to EPS. It may be beneficial to increase Prolixin dose and start Ingrezza in the community. At times, patient shakes violently unable to feed herself -alternatively we could use Clozapine but compliance is a problem  #Labs -performed recently  #Disposition -[facility] -follow up with [facility] team    Observation Level/Precautions:  15 minute checks  Laboratory:  CBC Chemistry  Profile UDS UA  Psychotherapy:    Medications:    Consultations:    Discharge Concerns:    Estimated LOS:  Other:     Physician Treatment Plan for Primary Diagnosis: Undifferentiated schizophrenia ([place]) Long Term Goal(s): Improvement in symptoms so as ready for discharge  Short Term Goals: Ability to identify changes in lifestyle to reduce recurrence of condition will improve, Ability to verbalize feelings will improve, Ability to disclose and discuss suicidal ideas, Ability to demonstrate self-control will improve, Ability to identify and develop effective coping behaviors will improve, Ability to maintain clinical measurements within normal limits will improve, Compliance with prescribed medications will improve and Ability to identify triggers associated with substance abuse/mental health issues will improve  Physician Treatment Plan for Secondary Diagnosis: Principal Problem:   Undifferentiated schizophrenia ([place]) Active Problems:   Tobacco use disorder  Long Term Goal(s): NA  Short Term Goals: NA  I certify that inpatient services furnished can reasonably be expected to improve the patient's condition.    Orson Slick, MD 7/24/20192:37 PM

## 2018-05-31 NOTE — Progress Notes (Signed)
Recreation Therapy Notes  INPATIENT RECREATION THERAPY ASSESSMENT  Patient Details Name: Gloria Perez MRN: [id number] DOB: 1961/01/09 Today's Date: 05/31/2018       Information Obtained From: (Patient will not respond to any questions or make eye contact)  Able to Participate in Assessment/Interview:    Patient Presentation:    Reason for Admission (Per Patient):    Patient Stressors:    Coping Skills:      Leisure Interests (2+):     Frequency of Recreation/Participation:    Awareness of Community Resources:     [employer]:     Current Use:    If no, Barriers?:    Expressed Interest in [place] of Residence:     Patient Main Form of Transportation:    Patient Strengths:     Patient Identified Areas of Improvement:     Patient Goal for Hospitalization:     Current SI (including self-harm):     Current HI:     Current AVH:    Staff Intervention Plan:    Consent to Intern Participation:    Gloria Perez 05/31/2018, 2:36 PM

## 2018-06-01 MED ORDER — DIPHENHYDRAMINE HCL 25 MG PO CAPS: 25.0000 mg | ORAL_CAPSULE | Freq: Four times a day (QID) | ORAL | Status: DC | PRN

## 2018-06-01 MED ORDER — TEMAZEPAM 15 MG PO CAPS
15.0000 mg | ORAL_CAPSULE | Freq: Every evening | ORAL | Status: DC | PRN
  Administered 2018-06-01: 15 mg via ORAL
  Filled 2018-06-01: qty 1

## 2018-06-01 MED ORDER — FLUPHENAZINE DECANOATE 25 MG/ML IJ SOLN: 50.0000 mg | INTRAMUSCULAR | Status: DC

## 2018-06-01 NOTE — Progress Notes (Signed)
[facility] MD Progress Note  06/02/2018 10:32 AM Gloria Perez  MRN:  [id number]  Subjective:    Gloria Perez is a 57yo female with schizophrenia on Prolixin dec for year admitted for psychotic agitation at the group home.Received 18.5 mg of Prolixin dec on 7/18 from ACT team and another 25 mg on 7/23 in the ER. Gets confused at times. Has TD.   Gloria Perez slept 1 hour last night in spite of Seroquel 100 mg and Temazepam 15 mg given. We will increase both tonight. She is in bed this morning complaining of leg pain. She is asking for Gatorade. K level slightly low on admission. Will recheck. She is unsteady and uses a walker but refused PT.    Principal Problem: Undifferentiated schizophrenia ([place]) Diagnosis:   Patient Active Problem List   Diagnosis Date Noted  . Undifferentiated schizophrenia ([place]) [F20.3] 04/05/2018    Priority: High  . Tobacco use disorder [F17.200] 05/07/2018   Total Time spent with patient: 20 minutes  Past Psychiatric History: schizophrenia  Past Medical History:  Past Medical History:  Diagnosis Date  . Abnormal thyroid blood test   . Acute left-sided low back pain without sciatica   . Asthma   . B12 deficiency   . CHF (congestive heart failure) ([place])   . Chronic pain   . Contusion of lower back   . COPD (chronic obstructive pulmonary disease) ([place])   . Falling episodes   . GERD (gastroesophageal reflux disease)   . Hepatitis   . Hypertension   . Nonischemic cardiomyopathy ([place])   . Physical debility   . Pre-diabetes   . Schizophrenia ([place])   . Severe obesity (BMI 35.0-35.9 with comorbidity) ([place])   . Vitamin D deficiency   . Weight loss     Past Surgical History:  Procedure Laterality Date  . CESAREAN SECTION    . COLONOSCOPY    . COLONOSCOPY WITH PROPOFOL N/A 02/22/2018   Procedure: COLONOSCOPY WITH PROPOFOL;  Surgeon: Toledo, Benay Pike, MD;  Location: [facility] ENDOSCOPY;  Service: Gastroenterology;  Laterality: N/A;  . Right arm surgery    . TUBAL  LIGATION     Family History:  Family History  Problem Relation Age of Onset  . Diabetes Mother    Family Psychiatric  History: none Social History:  Social History   Substance and Sexual Activity  Alcohol Use Not Currently     Social History   Substance and Sexual Activity  Drug Use Not Currently    Social History   Socioeconomic History  . Marital status: Widowed    Spouse name: Not on file  . Number of children: Not on file  . Years of education: Not on file  . Highest education level: Not on file  Occupational History  . Not on file  Social Needs  . Financial resource strain: Not on file  . Food insecurity:    Worry: Not on file    Inability: Not on file  . Transportation needs:    Medical: Not on file    Non-medical: Not on file  Tobacco Use  . Smoking status: Current Every Day Smoker    Packs/day: 0.50    Years: 35.00    Pack years: 17.50    Types: Cigarettes  . Smokeless tobacco: Never Used  Substance and Sexual Activity  . Alcohol use: Not Currently  . Drug use: Not Currently  . Sexual activity: Not on file  Lifestyle  . Physical activity:    Days per  week: Not on file    Minutes per session: Not on file  . Stress: Not on file  Relationships  . Social connections:    Talks on phone: Not on file    Gets together: Not on file    Attends religious service: Not on file    Active member of club or organization: Not on file    Attends meetings of clubs or organizations: Not on file    Relationship status: Not on file  Other Topics Concern  . Not on file  Social History Narrative  . Not on file   Additional Social History:                         Sleep: Fair  Appetite:  Fair  Current Medications: Current Facility-Administered Medications  Medication Dose Route Frequency Provider Last Rate Last Dose  . acetaminophen (TYLENOL) tablet 650 mg  650 mg Oral Q6H PRN Clapacs, Madie Reno, MD   650 mg at 06/01/18 2158  . alum & mag  hydroxide-simeth (MAALOX/MYLANTA) 200-200-20 MG/5ML suspension 30 mL  30 mL Oral Q4H PRN Clapacs, John T, MD      . diclofenac (VOLTAREN) EC tablet 75 mg  75 mg Oral BID Jhamari Markowicz B, MD      . diphenhydrAMINE (BENADRYL) capsule 25 mg  25 mg Oral Q6H PRN Tyjuan Demetro B, MD      . Derrill Memo ON 06/08/2018] fluPHENAZine decanoate (PROLIXIN) injection 50 mg  50 mg Intramuscular Q14 Days Deloise Marchant B, MD      . magnesium hydroxide (MILK OF MAGNESIA) suspension 30 mL  30 mL Oral Daily PRN Clapacs, John T, MD      . QUEtiapine (SEROQUEL) tablet 200 mg  200 mg Oral QHS Abdelrahman Nair B, MD      . temazepam (RESTORIL) capsule 30 mg  30 mg Oral QHS PRN Moselle Rister B, MD        Lab Results: No results found for this or any previous visit (from the past 48 hour(s)).  Blood Alcohol level:  Lab Results  Component Value Date   ETH <10 05/29/2018   ETH <10 96/75/9163    Metabolic Disorder Labs: Lab Results  Component Value Date   HGBA1C 4.8 05/04/2018   MPG 91.06 05/04/2018   MPG 88.19 05/02/2018   No results found for: PROLACTIN Lab Results  Component Value Date   CHOL 189 05/04/2018   TRIG 136 05/04/2018   HDL 79 05/04/2018   CHOLHDL 2.4 05/04/2018   VLDL 27 05/04/2018   LDLCALC 83 05/04/2018   LDLCALC 58 08/17/2017    Physical Findings: AIMS: Facial and Oral Movements Muscles of Facial Expression: None, normal Lips and Perioral Area: None, normal Jaw: None, normal Tongue: None, normal,Extremity Movements Upper (arms, wrists, hands, fingers): None, normal Lower (legs, knees, ankles, toes): None, normal, Trunk Movements Neck, shoulders, hips: None, normal, Overall Severity Severity of abnormal movements (highest score from questions above): None, normal Incapacitation due to abnormal movements: None, normal Patient's awareness of abnormal movements (rate only patient's report): No Awareness, Dental Status Current problems with teeth and/or dentures?:  No Does patient usually wear dentures?: No  CIWA:  CIWA-Ar Total: 0 COWS:  COWS Total Score: 0  Musculoskeletal: Strength & Muscle Tone: decreased Gait & Station: unsteady Patient leans: N/A  Psychiatric Specialty Exam: Physical Exam  Nursing note and vitals reviewed. Psychiatric: Her affect is blunt. Her speech is delayed. She is slowed and withdrawn. Thought  content is paranoid and delusional. Cognition and memory are impaired. She expresses impulsivity.    Review of Systems  Neurological: Negative.   Psychiatric/Behavioral: Negative.   All other systems reviewed and are negative.   Blood pressure (!) 155/113, pulse (!) 108, temperature 98.5 F (36.9 C), temperature source Oral, resp. rate (!) 9, height 5' (1.524 m), weight 66.7 kg (147 lb 0.8 oz), SpO2 100 %.Body mass index is 28.72 kg/m.  General Appearance: Disheveled  Eye Contact:  Fair  Speech:  Slow and Slurred  Volume:  Decreased  Mood:  Depressed  Affect:  Flat  Thought Process:  Disorganized, Irrelevant and Descriptions of Associations: Loose  Orientation:  Negative  Thought Content:  Illogical  Suicidal Thoughts:  No  Homicidal Thoughts:  No  Memory:  Immediate;   Poor Recent;   Poor Remote;   Poor  Judgement:  Poor  Insight:  Lacking  Psychomotor Activity:  Psychomotor Retardation  Concentration:  Concentration: Poor and Attention Span: Poor  Recall:  Poor  Fund of Knowledge:  Poor  Language:  Poor  Akathisia:  No  Handed:  Right  AIMS (if indicated):     Assets:  Communication Skills Desire for Improvement Financial Resources/Insurance Housing Resilience Social Support  ADL's:  Intact  Cognition:  WNL  Sleep:  Number of Hours: 1     Treatment Plan Summary: Daily contact with patient to assess and evaluate symptoms and progress in treatment and Medication management   Gloria Perez is a 57 year old female with a history of schizophrenia maintained on Prolixin injections returns to the hospital  with agitation.  #Agitation, resolved  #Psychosis, improving -patient received 18.5 mg of Prolixin from ACT team on 7/18 and 25 mg in the ER on 7/23 -has TD -will try to switch to Seroquel 200 mg tonight  #Insomnia, severe slept 1 hour only -increase Restoril to 30 mg nightly PRN   #Fall risk -uses a walker -refused PT consult  #Leg pain  -check potassium -Voltaren 75 mg BID  #Labs -performed recently -EKG reviewed, NSR with QTc 437  #[facility] -[facility] up with [facility]    Orson Slick, MD 06/02/2018, 10:32 AM

## 2018-06-01 NOTE — BHH Group Notes (Signed)
  06/01/2018  Time: 1PM  Type of Therapy/Topic:  Group Therapy:  Balance in Life  Participation Level:  Minimal  Description of Group:   This group will address the concept of balance and how it feels and looks when one is unbalanced. Patients will be encouraged to process areas in their lives that are out of balance and identify reasons for remaining unbalanced. Facilitators will guide patients in utilizing problem-solving interventions to address and correct the stressor making their life unbalanced. Understanding and applying boundaries will be explored and addressed for obtaining and maintaining a balanced life. Patients will be encouraged to explore ways to assertively make their unbalanced needs known to significant others in their lives, using other group members and facilitator for support and feedback.  Therapeutic Goals: 1. Patient will identify two or more emotions or situations they have that consume much of in their lives. 2. Patient will identify signs/triggers that life has become out of balance:  3. Patient will identify two ways to set boundaries in order to achieve balance in their lives:  4. Patient will demonstrate ability to communicate their needs through discussion and/or role plays  Summary of Patient Progress Pt attended group but did not participate in group discussions. Pt was fixated on CSW's shoes, and actively attempted to take CSW's shoes off her feat. CSW redirected pt and she was able to accept this redirection.     Therapeutic Modalities:   Cognitive Behavioral Therapy Solution-Focused Therapy Assertiveness Training  Alden Hipp, MSW, LCSW Clinical Social Worker 06/01/2018 1:54 PM

## 2018-06-01 NOTE — Plan of Care (Signed)
Patient is stable and comfortable , verbalize positive feeling about her self, improving mentally and emotionally voice no concerns at this time.denies SI/HI/AVH, 15 minute safety checks is in progress, no distress.     Problem: Activity: Goal: Will identify at least one activity in which they can participate Outcome: Progressing   Problem: Coping: Goal: Ability to identify and develop effective coping behavior will improve Outcome: Progressing   Problem: Education: Goal: Knowledge of  General Education information/materials will improve Outcome: Progressing Goal: Emotional status will improve Outcome: Progressing Goal: Mental status will improve Outcome: Progressing Goal: Verbalization of understanding the information provided will improve Outcome: Progressing

## 2018-06-01 NOTE — Progress Notes (Signed)
D: Patient denies SI/HI/AVH. Patient is appropriate during assessment. Patient has no complaints. Patient is able to verbalize feelings. Patient is mildly anxious but does not report any anxiety during assessment. Patient is educated on fall risk and agrees to use walker during all movement on the unit.   A: Patient's safety is maintained on this unit. Patient is rounded on for safety.   R: Patient is appropriate and participating in care.

## 2018-06-01 NOTE — Progress Notes (Signed)
Recreation Therapy Notes  Date: 06/01/2018  Time: 9:30 am   Location: Craft Room   Behavioral response: N/A   Intervention Topic: Happiness  Discussion/Intervention: Patient did not attend group.   Clinical Observations/Feedback:  Patient did not attend group.   Milena Liggett LRT/CTRS        Asyria Kolander 06/01/2018 11:01 AM

## 2018-06-01 NOTE — BHH Group Notes (Signed)
LCSW Group Therapy Note 06/01/2018 9:00 AM  Type of Therapy and Topic:  Group Therapy:  Setting Goals  Participation Level:  Minimal  Description of Group: In this process group, patients discussed using strengths to work toward goals and address challenges.  Patients identified two positive things about themselves and one goal they were working on.  Patients were given the opportunity to share openly and support each other's plan for self-empowerment.  The group discussed the value of gratitude and were encouraged to have a daily reflection of positive characteristics or circumstances.  Patients were encouraged to identify a plan to utilize their strengths to work on current challenges and goals.  Therapeutic Goals 1. Patient will verbalize personal strengths/positive qualities and relate how these can assist with achieving desired personal goals 2. Patients will verbalize affirmation of peers plans for personal change and goal setting 3. Patients will explore the value of gratitude and positive focus as related to successful achievement of goals 4. Patients will verbalize a plan for regular reinforcement of personal positive qualities and circumstances.  Summary of Patient Progress:  Gloria Perez did not participate much in the group discussion on setting goals using the SMART Model.  Gloria Perez did share with the group that Gloria Perez has a goal to work on improving her reading.  CSW encouraged Gloria Perez to talk with members of her ACTT who can help her to get enrolled in a program with the local community college that may be able to work with her on improving her reading skills.     Therapeutic Modalities Cognitive Behavioral Therapy Motivational Interviewing    Gloria Perez, [place] 06/01/2018 12:41 PM
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
## 2018-06-01 NOTE — Plan of Care (Signed)
Pt continues to progress towards goals and d/c. RN will continue to monitor.  Problem: Activity: Goal: Will identify at least one activity in which they can participate Outcome: Progressing   Problem: Coping: Goal: Ability to identify and develop effective coping behavior will improve Outcome: Progressing   Problem: Education: Goal: Knowledge of [place] [facility] Education information/materials will improve Outcome: Progressing Goal: Emotional status will improve Outcome: Progressing Goal: Mental status will improve Outcome: Progressing Goal: Verbalization of understanding the information provided will improve Outcome: Progressing   Problem: Safety: Goal: Ability to remain free from injury will improve Outcome: Progressing

## 2018-06-01 NOTE — Plan of Care (Signed)
Pt continues to progress towards goals and d/c. RN will continue to monitor.  Problem: Activity: Goal: Will identify at least one activity in which they can participate 06/01/2018 2207 by Hermelinda Medicus, RN Outcome: Progressing 06/01/2018 2114 by Hermelinda Medicus, RN Outcome: Progressing   Problem: Coping: Goal: Ability to identify and develop effective coping behavior will improve 06/01/2018 2207 by Hermelinda Medicus, RN Outcome: Progressing 06/01/2018 2114 by Hermelinda Medicus, RN Outcome: Progressing   Problem: Education: Goal: Knowledge of [place] Education information/materials will improve 06/01/2018 2207 by Hermelinda Medicus, RN Outcome: Progressing 06/01/2018 2114 by Hermelinda Medicus, RN Outcome: Progressing Goal: Emotional status will improve 06/01/2018 2207 by Hermelinda Medicus, RN Outcome: Progressing 06/01/2018 2114 by Hermelinda Medicus, RN Outcome: Progressing Goal: Mental status will improve 06/01/2018 2207 by Hermelinda Medicus, RN Outcome: Progressing 06/01/2018 2114 by Hermelinda Medicus, RN Outcome: Progressing Goal: Verbalization of understanding the information provided will improve 06/01/2018 2207 by Hermelinda Medicus, RN Outcome: Progressing 06/01/2018 2114 by Hermelinda Medicus, RN Outcome: Progressing   Problem: Safety: Goal: Ability to remain free from injury will improve 06/01/2018 2207 by Hermelinda Medicus, RN Outcome: Progressing 06/01/2018 2114 by Hermelinda Medicus, RN Outcome: Progressing

## 2018-06-01 NOTE — Progress Notes (Signed)
PT Cancellation Note  Patient Details Name: Gloria Perez MRN: [id number] DOB: [birth year]   Cancelled Treatment:     Order received. Chart reviewed. PT attempted session however pt continuously refuses to work with PT. Pt sitting EOB with flat affect. Pt intermittently verbal with PT however speaks in short phrases quickly saying no to everything PT says.  Yolonda Kida, SPT    [name] 06/01/2018, 4:24 PM

## 2018-06-01 NOTE — Progress Notes (Addendum)
[facility] MD Progress Note  06/01/2018 11:37 AM Gloria Perez  MRN:  [id number]  Subjective:    Gloria Perez is in her room resting. She denies any problems with depression, anxiety or psychosis. There are no behavioral difficulties. She seems to tolerate doubling on Prolixin decanoate well. There are no tremors. She walks with difficulties using a walker.   Principal Problem: Undifferentiated schizophrenia ([place]) Diagnosis:   Patient Active Problem List   Diagnosis Date Noted  . Undifferentiated schizophrenia ([place]) [F20.3] 04/05/2018    Priority: High  . Tobacco use disorder [F17.200] 05/07/2018   Total Time spent with patient: 20 minutes  Past Psychiatric History: schizophrenia  Past Medical History:  Past Medical History:  Diagnosis Date  . Abnormal thyroid blood test   . Acute left-sided low back pain without sciatica   . Asthma   . B12 deficiency   . CHF (congestive heart failure) ([place])   . Chronic pain   . Contusion of lower back   . COPD (chronic obstructive pulmonary disease) ([place])   . Falling episodes   . GERD (gastroesophageal reflux disease)   . Hepatitis   . Hypertension   . Nonischemic cardiomyopathy ([place])   . Physical debility   . Pre-diabetes   . Schizophrenia ([place])   . Severe obesity (BMI 35.0-35.9 with comorbidity) ([place])   . Vitamin D deficiency   . Weight loss     Past Surgical History:  Procedure Laterality Date  . CESAREAN SECTION    . COLONOSCOPY    . COLONOSCOPY WITH PROPOFOL N/A 02/22/2018   Procedure: COLONOSCOPY WITH PROPOFOL;  Surgeon: Toledo, Benay Pike, MD;  Location: [facility] ENDOSCOPY;  Service: Gastroenterology;  Laterality: N/A;  . Right arm surgery    . TUBAL LIGATION     Family History:  Family History  Problem Relation Age of Onset  . Diabetes Mother    Family Psychiatric  History: none Social History:  Social History   Substance and Sexual Activity  Alcohol Use Not Currently     Social History   Substance and Sexual Activity   Drug Use Not Currently    Social History   Socioeconomic History  . Marital status: Widowed    Spouse name: Not on file  . Number of children: Not on file  . Years of education: Not on file  . Highest education level: Not on file  Occupational History  . Not on file  Social Needs  . Financial resource strain: Not on file  . Food insecurity:    Worry: Not on file    Inability: Not on file  . Transportation needs:    Medical: Not on file    Non-medical: Not on file  Tobacco Use  . Smoking status: Current Every Day Smoker    Packs/day: 0.50    Years: 35.00    Pack years: 17.50    Types: Cigarettes  . Smokeless tobacco: Never Used  Substance and Sexual Activity  . Alcohol use: Not Currently  . Drug use: Not Currently  . Sexual activity: Not on file  Lifestyle  . Physical activity:    Days per week: Not on file    Minutes per session: Not on file  . Stress: Not on file  Relationships  . Social connections:    Talks on phone: Not on file    Gets together: Not on file    Attends religious service: Not on file    Active member of club or organization: Not on file  Attends meetings of clubs or organizations: Not on file    Relationship status: Not on file  Other Topics Concern  . Not on file  Social History Narrative  . Not on file   Additional Social History:                         Sleep: Fair  Appetite:  Fair  Current Medications: Current Facility-Administered Medications  Medication Dose Route Frequency Provider Last Rate Last Dose  . acetaminophen (TYLENOL) tablet 650 mg  650 mg Oral Q6H PRN Clapacs, Madie Reno, MD   650 mg at 05/31/18 1730  . alum & mag hydroxide-simeth (MAALOX/MYLANTA) 200-200-20 MG/5ML suspension 30 mL  30 mL Oral Q4H PRN Clapacs, John T, MD      . magnesium hydroxide (MILK OF MAGNESIA) suspension 30 mL  30 mL Oral Daily PRN Clapacs, John T, MD      . QUEtiapine (SEROQUEL) tablet 100 mg  100 mg Oral QHS Clapacs, Madie Reno, MD   100  mg at 05/31/18 2125    Lab Results: No results found for this or any previous visit (from the past 48 hour(s)).  Blood Alcohol level:  Lab Results  Component Value Date   ETH <10 05/29/2018   ETH <10 46/96/2952    Metabolic Disorder Labs: Lab Results  Component Value Date   HGBA1C 4.8 05/04/2018   MPG 91.06 05/04/2018   MPG 88.19 05/02/2018   No results found for: PROLACTIN Lab Results  Component Value Date   CHOL 189 05/04/2018   TRIG 136 05/04/2018   HDL 79 05/04/2018   CHOLHDL 2.4 05/04/2018   VLDL 27 05/04/2018   LDLCALC 83 05/04/2018   LDLCALC 58 08/17/2017    Physical Findings: AIMS: Facial and Oral Movements Muscles of Facial Expression: None, normal Lips and Perioral Area: None, normal Jaw: None, normal Tongue: None, normal,Extremity Movements Upper (arms, wrists, hands, fingers): None, normal Lower (legs, knees, ankles, toes): None, normal, Trunk Movements Neck, shoulders, hips: None, normal, Overall Severity Severity of abnormal movements (highest score from questions above): None, normal Incapacitation due to abnormal movements: None, normal Patient's awareness of abnormal movements (rate only patient's report): No Awareness, Dental Status Current problems with teeth and/or dentures?: No Does patient usually wear dentures?: No  CIWA:  CIWA-Ar Total: 0 COWS:  COWS Total Score: 0  Musculoskeletal: Strength & Muscle Tone: decreased Gait & Station: unsteady Patient leans: N/A  Psychiatric Specialty Exam: Physical Exam  Nursing note and vitals reviewed. Psychiatric: Her affect is blunt. Her speech is delayed. She is withdrawn. Thought content is delusional. Cognition and memory are impaired. She expresses impulsivity.    Review of Systems  Neurological: Negative.   Psychiatric/Behavioral: The patient has insomnia.   All other systems reviewed and are negative.   Blood pressure 130/89, pulse (!) 101, temperature 98.5 F (36.9 C), temperature source  Oral, resp. rate 18, height 5' (1.524 m), weight 66.7 kg (147 lb 0.8 oz), SpO2 100 %.Body mass index is 28.72 kg/m.  General Appearance: Casual  Eye Contact:  Good  Speech:  Slow  Volume:  Decreased  Mood:  Depressed  Affect:  Congruent  Thought Process:  Goal Directed and Descriptions of Associations: Intact  Orientation:  Full (Time, Place, and Person)  Thought Content:  WDL  Suicidal Thoughts:  No  Homicidal Thoughts:  No  Memory:  Immediate;   Fair Recent;   Fair Remote;   Fair  Judgement:  Poor  Insight:  Lacking  Psychomotor Activity:  Psychomotor Retardation  Concentration:  Concentration: Fair and Attention Span: Fair  Recall:  [employer] of Knowledge:  Fair  Language:  Fair  Akathisia:  No  Handed:  Right  AIMS (if indicated):     Assets:  Communication Skills Desire for Improvement Financial Resources/Insurance Housing Physical Health Resilience Social Support  ADL's:  Intact  Cognition:  WNL  Sleep:  Number of Hours: 6     Treatment Plan Summary: Daily contact with patient to assess and evaluate symptoms and progress in treatment and Medication management   Ms. Tirone is a 57 year old female with a history of schizophrenia maintained on Prolixin injections returns to the hospital with agitation.  #Agitation, resolved  #Psychosis, improving -patient received 18.5 mg of Prolixin from ACT team on 7/18 and 25 mg in the ER on 7/23 -start Seroquel for mood stabilization and sleep  #Insomnia -Restoril 15 mg PRN   #Fall risk -uses a walker -PT consult  #Labs -performed recently -EKG reviewed, NSR with QTc 437  #[facility] -follow up with [facility] ACT team        Orson Slick, MD 06/01/2018, 11:37 AM

## 2018-06-02 LAB — BASIC METABOLIC PANEL
Anion gap: 8 (ref 5–15)
BUN: 20 mg/dL (ref 6–20)
CALCIUM: 9.5 mg/dL (ref 8.9–10.3)
CO2: 26 mmol/L (ref 22–32)
CREATININE: 0.8 mg/dL (ref 0.44–1.00)
Chloride: 103 mmol/L (ref 98–111)
GFR calc Af Amer: >60 mL/min (ref >60)
GFR calc non Af Amer: >60 mL/min (ref >60)
GLUCOSE: 113 mg/dL — AB (ref 70–99)
Potassium: 4.1 mmol/L (ref 3.5–5.1)
Sodium: 137 mmol/L (ref 135–145)

## 2018-06-02 MED ORDER — QUETIAPINE FUMARATE 200 MG PO TABS
200.0000 mg | ORAL_TABLET | Freq: Every day | ORAL | Status: DC
  Administered 2018-06-02 – 2018-06-04 (×3): 200 mg via ORAL
  Filled 2018-06-02 (×3): qty 1

## 2018-06-02 MED ORDER — DICLOFENAC SODIUM 75 MG PO TBEC
75.0000 mg | DELAYED_RELEASE_TABLET | Freq: Two times a day (BID) | ORAL | Status: DC
  Administered 2018-06-02 – 2018-06-05 (×7): 75 mg via ORAL
  Filled 2018-06-02 (×8): qty 1

## 2018-06-02 MED ORDER — TEMAZEPAM 15 MG PO CAPS
30.0000 mg | ORAL_CAPSULE | Freq: Every evening | ORAL | Status: DC | PRN
  Administered 2018-06-02: 30 mg via ORAL
  Filled 2018-06-02: qty 2

## 2018-06-02 NOTE — BHH Group Notes (Signed)
06/02/2018 1PM  Type of Therapy and Topic:  Group Therapy:  Feelings around Relapse and Recovery  Participation Level:  None   Description of Group:    Patients in this group will discuss emotions they experience before and after a relapse. They will process how experiencing these feelings, or avoidance of experiencing them, relates to having a relapse. Facilitator will guide patients to explore emotions they have related to recovery. Patients will be encouraged to process which emotions are more powerful. They will be guided to discuss the emotional reaction significant others in their lives may have to patients' relapse or recovery. Patients will be assisted in exploring ways to respond to the emotions of others without this contributing to a relapse.  Therapeutic Goals: 1. Patient will identify two or more emotions that lead to a relapse for them 2. Patient will identify two emotions that result when they relapse 3. Patient will identify two emotions related to recovery 4. Patient will demonstrate ability to communicate their needs through discussion and/or role plays   Summary of Patient Progress: Patient came the last 5 minutes of group. She did not participate. She is still in the process of achieving her goals.     Therapeutic Modalities:   Cognitive Behavioral Therapy Solution-Focused Therapy Assertiveness Training Relapse Prevention Therapy   Darin Engels, [place] 06/02/2018 3:11 PM

## 2018-06-02 NOTE — Progress Notes (Signed)
Recreation Therapy Notes  Date: 06/02/2018  Time: 9:30 am   Location: Craft [facility]   Behavioral response: N/A   Intervention Topic: Coping Skills  Discussion/Intervention: Patient did not attend group.   Clinical Observations/Feedback:  Patient did not attend group.   [name] LRT/CTRS         Gloria Perez 06/02/2018 11:19 AM

## 2018-06-02 NOTE — Plan of Care (Signed)
  Problem: Activity: Goal: Will identify at least one activity in which they can participate Outcome: Progressing   Problem: Coping: Goal: Ability to identify and develop effective coping behavior will improve Outcome: Progressing   Problem: Education: Goal: Knowledge of [facility] Education information/materials will improve Outcome: Progressing Goal: Emotional status will improve Outcome: Progressing Goal: Mental status will improve Outcome: Progressing Goal: Verbalization of understanding the information provided will improve Outcome: Progressing   Problem: Safety: Goal: Ability to remain free from injury will improve Outcome: Progressing

## 2018-06-02 NOTE — Progress Notes (Signed)
Received Judge Stall this AM in her room asleep after breakfast. Later she was OOB with her walker without incident.She is irritable and started cursing when I aske her the assessment questions. She has been compliant with her medications and remained isolative to her room throughout the morning. No status change this PM.

## 2018-06-02 NOTE — Progress Notes (Signed)
PT Cancellation Note  Patient Details Name: Gloria Perez MRN: [id number] DOB: 1961-09-21   Cancelled Treatment:     Chart refused. PT attempts session this morning. Upon arrival pt in bed wearing just underwear. PT provides much verbal encouragement however pt refuses. Pt demonstrates resting tremor of hands and feet not present during attempt yesterday. Pt does not make eye contact and does not coherently speak at this time. Pt unable to remain awake long enough for PT to perform eval.  Yolonda Kida, SPT    Donnelle Rubey 06/02/2018, 11:27 AM

## 2018-06-02 NOTE — Progress Notes (Signed)
Nursing note 7p-7a  Pt observed interacting with peers on unit this shift. Displayed a flat affect and pleasant/ child like mood upon interaction with this Probation officer. Pt educated on use of walker but refused to use it while ambulating this shift. Pt complains of insomnia and generalized  Pain 10/10 ,denies SI/HI, and also denies any audio or visual hallucinations at this time. Tylenol given for pain as ordered by MD , see Mar for prn medication administration. Pt is able to verbally contract for safety with this RN. Goal: "get some sleep" Pt is now resting in bed with eyes closed, with no signs or symptoms of pain or distress noted. Pt continues to remain safe on the unit and is observed by rounding every 15 min. RN will continue to monitor.

## 2018-06-02 NOTE — [facility] Group Notes (Signed)
[facility] Group Notes:  (Nursing/MHT/Case Management/Adjunct)  Date:  06/02/2018  Time:  3:45 AM  Type of Therapy:  Group Therapy  Participation Level:  Active  Participation Quality:  Came to group late  Affect:  Appropriate  Cognitive:  Alert  Insight:  Appropriate  Engagement in Group:  Engaged  Modes of Intervention:  Support  Summary of Progress/Problems:  Nehemiah Settle 06/02/2018, 3:45 AM

## 2018-06-03 MED ORDER — TEMAZEPAM 15 MG PO CAPS
15.0000 mg | ORAL_CAPSULE | Freq: Every evening | ORAL | Status: DC | PRN
  Administered 2018-06-04: 15 mg via ORAL
  Filled 2018-06-03: qty 1

## 2018-06-03 NOTE — Progress Notes (Signed)
Received Gloria Perez this AM in her room, she was irritable  and refused to answer the assessment questions. She was asked twice about her pain and got very upset in the courtyard. She is taking her meals in the dining room with her peers. She is demanding at intervals with several requests. PT visited,  But she was eating dinner.

## 2018-06-03 NOTE — Progress Notes (Signed)
PT Cancellation Note  Patient Details Name: Gloria Perez MRN: [id number] DOB: [birth year]   Cancelled Treatment:    Reason Eval/Treat Not Completed: Patient declined, no reason specified.  Order received.  Chart reviewed.  Per RN, pt is in dining room and currently refusing treatment.  Will complete order at this time.  Please consult if need arises in the future.   Roxanne Gates, PT, DPT 06/03/2018, 5:00 PM

## 2018-06-03 NOTE — Progress Notes (Signed)
Nursing note 7p-7a  Pt observed interacting with peers on unit this shift. Displayed a flat affect and plaeant mood upon interaction with this Probation officer. Pt fixated on Gatorade but easily redirectable. Pt complains of insomnia and generalized pain. See MAR for prn medication administration. Pt denies SI/HI, and also denies any audio or visual hallucinations at this time. Pt is able to verbally contract for safety with this RN. Goal: to color. Pt is now resting in bed with eyes closed, with no signs or symptoms of pain or distress noted. Pt continues to remain safe on the unit and is observed by rounding every 15 min. RN will continue to monitor.

## 2018-06-03 NOTE — Progress Notes (Signed)
[facility] MD Progress Note  06/03/2018 11:48 AM Gloria Perez  MRN:  [id number]  Subjective:   Pt seen and chart reviewed. [name] is a 57yo female with schizophrenia on Prolixin dec for year admitted for psychotic agitation at the group home.Received 18.5 mg of Prolixin dec on 7/18 from ACT team and another 25 mg on 7/23 in the ER. She became confused at times. Has had TD.   She reported had 6.5hr sleep last night, and this morning at 11:45 AM, she is still soundly sleeping, but was arouseable.  She did not want to talk too much with the writer, but confirmed that she has been doing ok.   She apparently only slept 1hr the night before, thus, both Seroquel 100 mg and Temazepam 15 mg was increased.   Principal Problem: Undifferentiated schizophrenia ([place]) Diagnosis:   Patient Active Problem List   Diagnosis Date Noted  . Tobacco use disorder [F17.200] 05/07/2018  . Undifferentiated schizophrenia ([place]) [F20.3] 04/05/2018   Total Time spent with patient: 20 minutes  Past Psychiatric History: schizophrenia  Past Medical History:  Past Medical History:  Diagnosis Date  . Abnormal thyroid blood test   . Acute left-sided low back pain without sciatica   . Asthma   . B12 deficiency   . CHF (congestive heart failure) ([place])   . Chronic pain   . Contusion of lower back   . COPD (chronic obstructive pulmonary disease) ([place])   . Falling episodes   . GERD (gastroesophageal reflux disease)   . Hepatitis   . Hypertension   . Nonischemic cardiomyopathy ([place])   . Physical debility   . Pre-diabetes   . Schizophrenia ([place])   . Severe obesity (BMI 35.0-35.9 with comorbidity) ([place])   . Vitamin D deficiency   . Weight loss     Past Surgical History:  Procedure Laterality Date  . CESAREAN SECTION    . COLONOSCOPY    . COLONOSCOPY WITH PROPOFOL N/A 02/22/2018   Procedure: COLONOSCOPY WITH PROPOFOL;  Surgeon: Toledo, Benay Pike, MD;  Location: [facility] ENDOSCOPY;  Service: Gastroenterology;   Laterality: N/A;  . Right arm surgery    . TUBAL LIGATION     Family History:  Family History  Problem Relation Age of Onset  . Diabetes Mother    Family Psychiatric  History: none Social History:  Social History   Substance and Sexual Activity  Alcohol Use Not Currently     Social History   Substance and Sexual Activity  Drug Use Not Currently    Social History   Socioeconomic History  . Marital status: Widowed    Spouse name: Not on file  . Number of children: Not on file  . Years of education: Not on file  . Highest education level: Not on file  Occupational History  . Not on file  Social Needs  . Financial resource strain: Not on file  . Food insecurity:    Worry: Not on file    Inability: Not on file  . Transportation needs:    Medical: Not on file    Non-medical: Not on file  Tobacco Use  . Smoking status: Current Every Day Smoker    Packs/day: 0.50    Years: 35.00    Pack years: 17.50    Types: Cigarettes  . Smokeless tobacco: Never Used  Substance and Sexual Activity  . Alcohol use: Not Currently  . Drug use: Not Currently  . Sexual activity: Not on file  Lifestyle  . Physical  activity:    Days per week: Not on file    Minutes per session: Not on file  . Stress: Not on file  Relationships  . Social connections:    Talks on phone: Not on file    Gets together: Not on file    Attends religious service: Not on file    Active member of club or organization: Not on file    Attends meetings of clubs or organizations: Not on file    Relationship status: Not on file  Other Topics Concern  . Not on file  Social History Narrative  . Not on file   Additional Social History:   Sleep: Fair  Appetite:  Fair  Current Medications: Current Facility-Administered Medications  Medication Dose Route Frequency Provider Last Rate Last Dose  . acetaminophen (TYLENOL) tablet 650 mg  650 mg Oral Q6H PRN Clapacs, Madie Reno, MD   650 mg at 06/02/18 2143  . alum  & mag hydroxide-simeth (MAALOX/MYLANTA) 200-200-20 MG/5ML suspension 30 mL  30 mL Oral Q4H PRN Clapacs, John T, MD      . diclofenac (VOLTAREN) EC tablet 75 mg  75 mg Oral BID Pucilowska, Jolanta B, MD   75 mg at 06/03/18 0858  . diphenhydrAMINE (BENADRYL) capsule 25 mg  25 mg Oral Q6H PRN Pucilowska, Jolanta B, MD      . Derrill Memo ON 06/08/2018] fluPHENAZine decanoate (PROLIXIN) injection 50 mg  50 mg Intramuscular Q14 Days Pucilowska, Jolanta B, MD      . magnesium hydroxide (MILK OF MAGNESIA) suspension 30 mL  30 mL Oral Daily PRN Clapacs, John T, MD      . QUEtiapine (SEROQUEL) tablet 200 mg  200 mg Oral QHS Pucilowska, Jolanta B, MD   200 mg at 06/02/18 2143  . temazepam (RESTORIL) capsule 15 mg  15 mg Oral QHS PRN Wilmer Santillo, MD        Lab Results:  Results for orders placed or performed during the hospital encounter of 05/30/18 (from the past 48 hour(s))  Basic metabolic panel     Status: Abnormal   Collection Time: 06/02/18  3:21 PM  Result Value Ref Range   Sodium 137 135 - 145 mmol/L   Potassium 4.1 3.5 - 5.1 mmol/L   Chloride 103 98 - 111 mmol/L   CO2 26 22 - 32 mmol/L   Glucose, Bld 113 (H) 70 - 99 mg/dL   BUN 20 6 - 20 mg/dL   Creatinine, Ser 0.80 0.44 - 1.00 mg/dL   Calcium 9.5 8.9 - 10.3 mg/dL   GFR calc non Af Amer >60 >60 mL/min   GFR calc Af Amer >60 >60 mL/min    Comment: (NOTE) The eGFR has been calculated using the CKD EPI equation. This calculation has not been validated in all clinical situations. eGFR's persistently <60 mL/min signify possible Chronic Kidney Disease.    Anion gap 8 5 - 15    Comment: Performed at [facility], [address] [place], [place] [address]    Blood Alcohol level:  Lab Results  Component Value Date   [facility] <10 05/29/2018   ETH <10 01/75/1025    Metabolic Disorder Labs: Lab Results  Component Value Date   HGBA1C 4.8 05/04/2018   MPG 91.06 05/04/2018   MPG 88.19 05/02/2018   No results found for: PROLACTIN Lab Results   Component Value Date   CHOL 189 05/04/2018   TRIG 136 05/04/2018   HDL 79 05/04/2018   CHOLHDL 2.4 05/04/2018  VLDL 27 05/04/2018   LDLCALC 83 05/04/2018   LDLCALC 58 08/17/2017    Physical Findings: AIMS: Facial and Oral Movements Muscles of Facial Expression: None, normal Lips and Perioral Area: None, normal Jaw: None, normal Tongue: None, normal,Extremity Movements Upper (arms, wrists, hands, fingers): None, normal Lower (legs, knees, ankles, toes): None, normal, Trunk Movements Neck, shoulders, hips: None, normal, Overall Severity Severity of abnormal movements (highest score from questions above): None, normal Incapacitation due to abnormal movements: None, normal Patient's awareness of abnormal movements (rate only patient's report): No Awareness, Dental Status Current problems with teeth and/or dentures?: No Does patient usually wear dentures?: No  CIWA:  CIWA-Ar Total: 0 COWS:  COWS Total Score: 0  Musculoskeletal: Strength & Muscle Tone: decreased Gait & Station: unsteady Patient leans: N/A  Psychiatric Specialty Exam: Physical Exam  Nursing note and vitals reviewed. Psychiatric: Her affect is blunt. Her speech is delayed. She is slowed and withdrawn. Thought content is paranoid and delusional. Cognition and memory are impaired. She expresses impulsivity.    Review of Systems  Neurological: Negative.   Psychiatric/Behavioral: Negative.   All other systems reviewed and are negative.   Blood pressure (!) 155/113, pulse (!) 108, temperature 98.5 F (36.9 C), temperature source Oral, resp. rate (!) 9, height 5' (1.524 m), weight 66.7 kg (147 lb 0.8 oz), SpO2 100 %.Body mass index is 28.72 kg/m.  General Appearance: Disheveled  Eye Contact:  Minimal  Speech:  Slow and Slurred  Volume:  Normal  Mood:  Depressed  Affect:  Blunt and Flat  Thought Process:  Disorganized and Descriptions of Associations: Loose  Orientation:  Negative  Thought Content:  Negative   Suicidal Thoughts:  No  Homicidal Thoughts:  No  Memory:  Immediate;   Poor Recent;   Poor Remote;   Poor  Judgement:  Impaired  Insight:  Lacking  Psychomotor Activity:  Psychomotor Retardation  Concentration:  Concentration: Poor and Attention Span: Poor  Recall:  Poor  Fund of Knowledge:  Negative  Language:  Negative  Akathisia:  No  Handed:  Right  AIMS (if indicated):     Assets:  Financial Resources/Insurance Housing Social Support  ADL's:  Intact  Cognition:  WNL  Sleep:  Number of Hours: 6.25     Treatment Plan Summary: Daily contact with patient to assess and evaluate symptoms and progress in treatment and Medication management   Ms. Hauschild is a 57 year old female with a history of schizophrenia maintained on Prolixin injections returns to the hospital with agitation, and TD.  Thus, medication is changed to seroquel.   #Agitation, resolved  #Psychosis, improving -patient received 18.5 mg of Prolixin decanoate from ACT team on 7/18 and 25 mg in the ER on 7/23 -has TD -continue Seroquel 200 mg, to replace prolixin.   #Insomnia, severe slept 1 hour only - reduce Restoril to 15 mg nightly PRN, as she was oversedated this morning. Will continue to monitor  #Fall risk -uses a walker -refused PT consult  #Leg pain  -check potassium -Voltaren 75 mg BID  #Labs -performed recently -EKG reviewed, NSR with QTc 437  #[facility] -follow up with [facility] ACT team    Nollie Terlizzi, MD 06/03/2018, 11:48 AM

## 2018-06-03 NOTE — [facility] Group Notes (Signed)
LCSW Group Therapy Note  06/03/2018 1:15pm  Type of Therapy and Topic:  Group Therapy:  Cognitive Distortions  Participation Level:  Did Not Attend   Description of Group:    Patients in this group will be introduced to the topic of cognitive distortions.  Patients will identify and describe cognitive distortions, describe the feelings these distortions create for them.  Patients will identify one or more situations in their personal life where they have cognitively distorted thinking and will verbalize challenging this cognitive distortion through positive thinking skills.  Patients will practice the skill of using positive affirmations to challenge cognitive distortions using affirmation cards.    Therapeutic Goals:  1. Patient will identify two or more cognitive distortions they have used 2. Patient will identify one or more emotions that stem from use of a cognitive distortion 3. Patient will demonstrate use of a positive affirmation to counter a cognitive distortion through discussion and/or role play. 4. Patient will describe one way cognitive distortions can be detrimental to wellness   Summary of Patient Progress: Pt was invited to attend group but chose not to attend. CSW will continue to encourage pt to attend group throughout their admission.      Therapeutic Modalities:   Cognitive Behavioral Therapy Motivational Interviewing   Darinda [place]  CUEBAS-COLON, LCSW 06/03/2018 3:52 PM

## 2018-06-04 NOTE — Plan of Care (Signed)
Patient is adjusting well and maintaining her medical regimen denies Si/ HI , socializing well in the unit no distress , sleep long hours ,15 minute rounding is maintained  Problem: Activity: Goal: Will identify at least one activity in which they can participate Outcome: Progressing   Problem: Coping: Goal: Ability to identify and develop effective coping behavior will improve Outcome: Progressing   Problem: Education: Goal: Knowledge of [place] [facility] Education information/materials will improve Outcome: Progressing Goal: Emotional status will improve Outcome: Progressing Goal: Mental status will improve Outcome: Progressing Goal: Verbalization of understanding the information provided will improve Outcome: Progressing   Problem: Safety: Goal: Ability to remain free from injury will improve Outcome: Progressing

## 2018-06-04 NOTE — [facility] Group Notes (Signed)
LCSW Group Therapy Note 06/04/2018 1:15pm  Type of Therapy and Topic: Group Therapy: Feelings Around Returning Home & Establishing a Supportive Framework and Supporting Oneself When Supports Not Available  Participation Level: Did Not Attend  Description of Group:  Patients first processed thoughts and feelings about upcoming discharge. These included fears of upcoming changes, lack of change, new living environments, judgements and expectations from others and overall stigma of mental health issues. The group then discussed the definition of a supportive framework, what that looks and feels like, and how do to discern it from an unhealthy non-supportive network. The group identified different types of supports as well as what to do when your family/friends are less than helpful or unavailable  Therapeutic Goals  1. Patient will identify one healthy supportive network that they can use at discharge. 2. Patient will identify one factor of a supportive framework and how to tell it from an unhealthy network. 3. Patient able to identify one coping skill to use when they do not have positive supports from others. 4. Patient will demonstrate ability to communicate their needs through discussion and/or role plays.  Summary of Patient Progress:  Pt was invited to attend group but chose not to attend. CSW will continue to encourage pt to attend group throughout their admission.    Therapeutic Modalities Cognitive Behavioral Therapy Motivational Interviewing   Keyri Salberg  CUEBAS-COLON, LCSW 06/04/2018 9:49 AM

## 2018-06-04 NOTE — Plan of Care (Addendum)
Patient found in day room upon my arrival. Patient is visible and social this evening. Patient mood and affect much brighter and improved since last interaction with her last week. Patient is pleasant and polite but remains needy in that she asks for several things (i.e. gatorade, antacid, tylenol) but each one at a time. Patient continues to steal and hoard snacks in her room. Overall improvement is behavior, appearance, and interaction. Denies SI/HI/AVH. Complains of abdominal pain, given Tylenol and Maalox with positive results. Reports eating and voiding adequately. Compliant with HS medication and staff direction. Given Restoril for sleep with positive results. Q 15 minute checks maintained. Will continue to monitor throughout the shift.  Patient slept 6.75 hours. No apparent distress. Will endorse care to oncoming shift.  Problem: Activity: Goal: Will identify at least one activity in which they can participate Outcome: Progressing   Problem: Coping: Goal: Ability to identify and develop effective coping behavior will improve Outcome: Progressing   Problem: Education: Goal: Emotional status will improve Outcome: Progressing Goal: Mental status will improve Outcome: Progressing Goal: Verbalization of understanding the information provided will improve Outcome: Progressing   Problem: Safety: Goal: Ability to remain free from injury will improve Outcome: Progressing

## 2018-06-04 NOTE — Progress Notes (Signed)
[facility] MD Progress Note  06/04/2018 1:17 PM Gloria Perez  MRN:  [id number]  Subjective:   Pt seen and chart reviewed. [name] is a 57yo female with schizophrenia on Prolixin dec for year admitted for psychotic agitation at the group home.Received 18.5 mg of Prolixin dec on 7/18 from ACT team and another 25 mg on 7/23 in the ER. She became confused at times. Has had TD.   She is a bit less irritable today, complained about "hurting" "all over", but was seen walking on the unit without any distress.  No SI.    She slept well without Temazepam last night.   Principal Problem: Undifferentiated schizophrenia ([place]) Diagnosis:   Patient Active Problem List   Diagnosis Date Noted  . Tobacco use disorder [F17.200] 05/07/2018  . Undifferentiated schizophrenia ([place]) [F20.3] 04/05/2018   Total Time spent with patient: 15 minutes  Past Psychiatric History: schizophrenia  Past Medical History:  Past Medical History:  Diagnosis Date  . Abnormal thyroid blood test   . Acute left-sided low back pain without sciatica   . Asthma   . B12 deficiency   . CHF (congestive heart failure) ([place])   . Chronic pain   . Contusion of lower back   . COPD (chronic obstructive pulmonary disease) ([place])   . Falling episodes   . GERD (gastroesophageal reflux disease)   . Hepatitis   . Hypertension   . Nonischemic cardiomyopathy ([place])   . Physical debility   . Pre-diabetes   . Schizophrenia ([place])   . Severe obesity (BMI 35.0-35.9 with comorbidity) ([place])   . Vitamin D deficiency   . Weight loss     Past Surgical History:  Procedure Laterality Date  . CESAREAN SECTION    . COLONOSCOPY    . COLONOSCOPY WITH PROPOFOL N/A 02/22/2018   Procedure: COLONOSCOPY WITH PROPOFOL;  Surgeon: Toledo, Benay Pike, MD;  Location: [facility] ENDOSCOPY;  Service: Gastroenterology;  Laterality: N/A;  . Right arm surgery    . TUBAL LIGATION     Family History:  Family History  Problem Relation Age of Onset  . Diabetes Mother     Family Psychiatric  History: none Social History:  Social History   Substance and Sexual Activity  Alcohol Use Not Currently     Social History   Substance and Sexual Activity  Drug Use Not Currently    Social History   Socioeconomic History  . Marital status: Widowed    Spouse name: Not on file  . Number of children: Not on file  . Years of education: Not on file  . Highest education level: Not on file  Occupational History  . Not on file  Social Needs  . Financial resource strain: Not on file  . Food insecurity:    Worry: Not on file    Inability: Not on file  . Transportation needs:    Medical: Not on file    Non-medical: Not on file  Tobacco Use  . Smoking status: Current Every Day Smoker    Packs/day: 0.50    Years: 35.00    Pack years: 17.50    Types: Cigarettes  . Smokeless tobacco: Never Used  Substance and Sexual Activity  . Alcohol use: Not Currently  . Drug use: Not Currently  . Sexual activity: Not on file  Lifestyle  . Physical activity:    Days per week: Not on file    Minutes per session: Not on file  . Stress: Not on file  Relationships  .  Social connections:    Talks on phone: Not on file    Gets together: Not on file    Attends religious service: Not on file    Active member of club or organization: Not on file    Attends meetings of clubs or organizations: Not on file    Relationship status: Not on file  Other Topics Concern  . Not on file  Social History Narrative  . Not on file   Additional Social History:   Sleep: Fair  Appetite:  Fair  Current Medications: Current Facility-Administered Medications  Medication Dose Route Frequency Provider Last Rate Last Dose  . acetaminophen (TYLENOL) tablet 650 mg  650 mg Oral Q6H PRN Clapacs, Madie Reno, MD   650 mg at 06/02/18 2143  . alum & mag hydroxide-simeth (MAALOX/MYLANTA) 200-200-20 MG/5ML suspension 30 mL  30 mL Oral Q4H PRN Clapacs, John T, MD      . diclofenac (VOLTAREN) EC  tablet 75 mg  75 mg Oral BID Pucilowska, Jolanta B, MD   75 mg at 06/04/18 0829  . diphenhydrAMINE (BENADRYL) capsule 25 mg  25 mg Oral Q6H PRN Pucilowska, Jolanta B, MD      . Derrill Memo ON 06/08/2018] fluPHENAZine decanoate (PROLIXIN) injection 50 mg  50 mg Intramuscular Q14 Days Pucilowska, Jolanta B, MD      . magnesium hydroxide (MILK OF MAGNESIA) suspension 30 mL  30 mL Oral Daily PRN Clapacs, John T, MD      . QUEtiapine (SEROQUEL) tablet 200 mg  200 mg Oral QHS Pucilowska, Jolanta B, MD   200 mg at 06/03/18 2108  . temazepam (RESTORIL) capsule 15 mg  15 mg Oral QHS PRN Tula Schryver, MD        Lab Results:  Results for orders placed or performed during the hospital encounter of 05/30/18 (from the past 48 hour(s))  Basic metabolic panel     Status: Abnormal   Collection Time: 06/02/18  3:21 PM  Result Value Ref Range   Sodium 137 135 - 145 mmol/L   Potassium 4.1 3.5 - 5.1 mmol/L   Chloride 103 98 - 111 mmol/L   CO2 26 22 - 32 mmol/L   Glucose, Bld 113 (H) 70 - 99 mg/dL   BUN 20 6 - 20 mg/dL   Creatinine, Ser 0.80 0.44 - 1.00 mg/dL   Calcium 9.5 8.9 - 10.3 mg/dL   GFR calc non Af Amer >60 >60 mL/min   GFR calc Af Amer >60 >60 mL/min    Comment: (NOTE) The eGFR has been calculated using the CKD EPI equation. This calculation has not been validated in all clinical situations. eGFR's persistently <60 mL/min signify possible Chronic Kidney Disease.    Anion gap 8 5 - 15    Comment: Performed at [facility], [address] [place], [place] [address]    Blood Alcohol level:  Lab Results  Component Value Date   [facility] <10 05/29/2018   ETH <10 09/38/1829    Metabolic Disorder Labs: Lab Results  Component Value Date   HGBA1C 4.8 05/04/2018   MPG 91.06 05/04/2018   MPG 88.19 05/02/2018   No results found for: PROLACTIN Lab Results  Component Value Date   CHOL 189 05/04/2018   TRIG 136 05/04/2018   HDL 79 05/04/2018   CHOLHDL 2.4 05/04/2018   VLDL 27 05/04/2018    LDLCALC 83 05/04/2018   LDLCALC 58 08/17/2017    Physical Findings: AIMS: Facial and Oral Movements Muscles of Facial Expression: None,  normal Lips and Perioral Area: None, normal Jaw: None, normal Tongue: None, normal,Extremity Movements Upper (arms, wrists, hands, fingers): None, normal Lower (legs, knees, ankles, toes): None, normal, Trunk Movements Neck, shoulders, hips: None, normal, Overall Severity Severity of abnormal movements (highest score from questions above): None, normal Incapacitation due to abnormal movements: None, normal Patient's awareness of abnormal movements (rate only patient's report): No Awareness, Dental Status Current problems with teeth and/or dentures?: No Does patient usually wear dentures?: No  CIWA:  CIWA-Ar Total: 0 COWS:  COWS Total Score: 0  Musculoskeletal: Strength & Muscle Tone: decreased Gait & Station: unsteady Patient leans: N/A  Psychiatric Specialty Exam: Physical Exam  Nursing note and vitals reviewed. Psychiatric: Her affect is blunt. Her speech is delayed. She is slowed and withdrawn. Thought content is paranoid and delusional. Cognition and memory are impaired. She expresses impulsivity.    Review of Systems  Neurological: Negative.   Psychiatric/Behavioral: Negative.   All other systems reviewed and are negative.   Blood pressure (!) 125/94, pulse 97, temperature 98.4 F (36.9 C), temperature source Oral, resp. rate 18, height 5' (1.524 m), weight 66.7 kg (147 lb 0.8 oz), SpO2 100 %.Body mass index is 28.72 kg/m.  General Appearance: Disheveled  Eye Contact:  Fair  Speech:  Clear and Coherent and Normal Rate  Volume:  Normal  Mood:  Depressed  Affect:  Congruent, Constricted and Depressed  Thought Process:  Disorganized and Descriptions of Associations: Loose  Orientation:  Negative  Thought Content:  Negative  Suicidal Thoughts:  No  Homicidal Thoughts:  No  Memory:  Immediate;   Poor Recent;   Poor Remote;   Poor   Judgement:  Impaired  Insight:  Lacking  Psychomotor Activity:  Normal and Psychomotor Retardation  Concentration:  Concentration: Poor and Attention Span: Poor  Recall:  Poor  Fund of Knowledge:  Negative  Language:  Negative  Akathisia:  No  Handed:  Right  AIMS (if indicated):     Assets:  Financial Resources/Insurance Housing Social Support  ADL's:  Intact  Cognition:  WNL  Sleep:  Number of Hours: 6.45     Treatment Plan Summary: Daily contact with patient to assess and evaluate symptoms and progress in treatment and Medication management   Ms. Almendariz is a 57 year old female with a history of schizophrenia maintained on Prolixin injections returns to the hospital with agitation, and TD.  Thus, medication is changed to seroquel.   #Agitation, resolved  #Psychosis, improving -patient received 18.5 mg of Prolixin decanoate from ACT team on 7/18 and 25 mg in the ER on 7/23 -has TD -continue Seroquel 200 mg, to replace prolixin.   #Insomnia, severe slept 1 hour only - continue Restoril to 15 mg nightly PRN, and slept well without it.   #Fall risk -uses a walker -refused PT consult  #Leg pain  -check potassium -Voltaren 75 mg BID  #Labs -performed recently -EKG reviewed, NSR with QTc 437  #[facility] -follow up with [facility] ACT team    Braxley Balandran, MD 06/04/2018, 1:17 PM

## 2018-06-04 NOTE — Progress Notes (Addendum)
Received Gloria Perez this AM during breakfast, she was compliant with her medication. Afterwards she refused to talk with this writer related to the assessment questions. She was noted strutting from one dinning room to another without her walker. Afterwards she took her walker and returned to her room. No change in her status this PM.  Gesenia Bantz was  talked to by the charge nurse relating to pinching a female's buttocks on the unit.

## 2018-06-05 MED ORDER — QUETIAPINE FUMARATE 200 MG PO TABS: 200.0000 mg | ORAL_TABLET | Freq: Every day | ORAL | 1 refills | Status: AC

## 2018-06-05 MED ORDER — DIPHENHYDRAMINE HCL 25 MG PO CAPS: 25.0000 mg | ORAL_CAPSULE | Freq: Four times a day (QID) | ORAL | 0 refills | Status: AC | PRN

## 2018-06-05 MED ORDER — FLUPHENAZINE DECANOATE 25 MG/ML IJ SOLN: 50.0000 mg | INTRAMUSCULAR | 1 refills | Status: AC

## 2018-06-05 NOTE — [facility] Suicide Risk Assessment (Signed)
Gloria Perez - [facility] Discharge Suicide Risk Assessment   Principal Problem: Undifferentiated schizophrenia [facility]) Discharge Diagnoses:  Patient Active Problem List   Diagnosis Date Noted  . Undifferentiated schizophrenia ([place]) [F20.3] 04/05/2018    Priority: High  . Tobacco use disorder [F17.200] 05/07/2018    Total Time spent with patient: 20 minutes  Musculoskeletal: Strength & Muscle Tone: decreased Gait & Station: unsteady Patient leans: N/A  Psychiatric Specialty Exam: Review of Systems  Neurological: Negative.   Psychiatric/Behavioral: Negative.     Blood pressure 137/85, pulse 86, temperature 98.8 F (37.1 C), resp. rate 18, height 5' (1.524 m), weight 66.7 kg (147 lb 0.8 oz), SpO2 98 %.Body mass index is 28.72 kg/m.  General Appearance: Casual  Eye Contact::  Good  Speech:  Clear and Coherent409  Volume:  Normal  Mood:  Euthymic  Affect:  Blunt  Thought Process:  Goal Directed and Descriptions of Associations: Intact  Orientation:  Full (Time, Place, and Person)  Thought Content:  WDL  Suicidal Thoughts:  No  Homicidal Thoughts:  No  Memory:  Immediate;   Poor Recent;   Poor Remote;   Poor  Judgement:  Impaired  Insight:  Shallow  Psychomotor Activity:  Normal  Concentration:  Poor  Recall:  Poor  Fund of Knowledge:Fair  Language: Fair  Akathisia:  No  Handed:  Right  AIMS (if indicated):     Assets:  Communication Skills Desire for Improvement Financial Resources/Insurance Housing Resilience Social Support  Sleep:  Number of Hours: 6.75  Cognition: WNL  ADL's:  Intact   Mental Status Per Nursing Assessment::   On Admission:  NA  Demographic Factors:  Divorced or widowed  Loss Factors: Loss of significant relationship and Decline in physical health  Historical Factors: Prior suicide attempts, Family history of mental illness or substance abuse and Impulsivity  Risk Reduction Factors:   Sense of responsibility to family, Living with another person,  especially a relative, Positive social support and Positive therapeutic relationship  Continued Clinical Symptoms:  Schizophrenia:   Paranoid or undifferentiated type  Cognitive Features That Contribute To Risk:  None    Suicide Risk:  Minimal: No identifiable suicidal ideation.  Patients presenting with no risk factors but with morbid ruminations; may be classified as minimal risk based on the severity of the depressive symptoms    Plan Of Care/Follow-up recommendations:  Activity:  as tolerated Diet:  low sodium heart healthy Other:  keep follow up appointments  Orson Slick, MD 06/05/2018, 9:30 AM

## 2018-06-05 NOTE — Discharge Summary (Signed)
Physician Discharge Summary Note  Patient:  Gloria Perez is an 57 y.o., female MRN:  [id number] DOB:  03-23-1961 Patient phone:  [phone] (home)  Patient address:   [place] [address],  Total Time spent with patient: 20 minutes plus 15 min on care coordination and documentation.  Date of Admission:  05/30/2018 Date of Discharge: 06/05/2018  Reason for Admission:  Psychotic break.  History of Present Illness:   Identifyin data. Gloria Perez is a 57 year old female with a history of schizophrenia.  Chief complaint. "It is cold in my room."  History pf present illness. Information was obtained from the patient and the chart. The patient was discharge from [facility] about one month ago and has been doing fine at her group home. Change in behavior, with agitation and yelling, appened so suddenly that her caregivers suspected substance use. The patient was only positive for benzodiazepines, likely given in the ER. Of note, she is positive fro tricyclics that I do not see on her medication list.   The patient heself, denies any symptoms of depression, anxiety or psychosis. She is not suicidal or homicidal. There is poverty of thought and speech. When invited to to treatment team. She did not say a word.  Past psychiatric history.  Long history of mental illness with multiple admissions and medication trials. Denies suicide attempts but chart review suggests otherwise. In the care of [facility] team stable on Prolixin decanoate injections. She receives low dose Prolixin decanoate 18.5 mg every 3 weeks. She was given 50 mg of Prolixin decanoate during previous hospitalization. She received 18.5 mg from her ACT team on 7/18 and 25 mg in the ER on 7/23. Low dose of Prolixin was likely chosen due to tardive dyskinesia. Indeed, the patient experiences occasional tremors so severe that she is unable to feed herself.   Family psychiatric history. None.  Social history.  She resides at the [facility] in [place]. She has 4 sisters, 4 children and 8 grandchildren. She is estranged from her family. She was placed at the facility after her husband passed away.   Principal Problem: Undifferentiated schizophrenia [facility]) Discharge Diagnoses: Patient Active Problem List   Diagnosis Date Noted  . Undifferentiated schizophrenia ([place]) [F20.3] 04/05/2018    Priority: High  . Tobacco use disorder [F17.200] 05/07/2018   Past Medical History:  Past Medical History:  Diagnosis Date  . Abnormal thyroid blood test   . Acute left-sided low back pain without sciatica   . Asthma   . B12 deficiency   . CHF (congestive heart failure) ([place])   . Chronic pain   . Contusion of lower back   . COPD (chronic obstructive pulmonary disease) ([place])   . Falling episodes   . GERD (gastroesophageal reflux disease)   . Hepatitis   . Hypertension   . Nonischemic cardiomyopathy ([place])   . Physical debility   . Pre-diabetes   . Schizophrenia ([place])   . Severe obesity (BMI 35.0-35.9 with comorbidity) ([place])   . Vitamin D deficiency   . Weight loss     Past Surgical History:  Procedure Laterality Date  . CESAREAN SECTION    . COLONOSCOPY    . COLONOSCOPY WITH PROPOFOL N/A 02/22/2018   Procedure: COLONOSCOPY WITH PROPOFOL;  Surgeon: Toledo, Benay Pike, MD;  Location: [facility] ENDOSCOPY;  Service: Gastroenterology;  Laterality: N/A;  . Right arm surgery    . TUBAL LIGATION     Family History:  Family History  Problem Relation Age of Onset  . Diabetes Mother    Social History:  Social History   Substance and Sexual Activity  Alcohol Use Not Currently     Social History   Substance and Sexual Activity  Drug Use Not Currently    Social History   Socioeconomic History  . Marital status: Widowed    Spouse name: Not on file  . Number of children: Not on file  . Years of education: Not on file  . Highest education level: Not on file  Occupational History  . Not on  file  Social Needs  . Financial resource strain: Not on file  . Food insecurity:    Worry: Not on file    Inability: Not on file  . Transportation needs:    Medical: Not on file    Non-medical: Not on file  Tobacco Use  . Smoking status: Current Every Day Smoker    Packs/day: 0.50    Years: 35.00    Pack years: 17.50    Types: Cigarettes  . Smokeless tobacco: Never Used  Substance and Sexual Activity  . Alcohol use: Not Currently  . Drug use: Not Currently  . Sexual activity: Not on file  Lifestyle  . Physical activity:    Days per week: Not on file    Minutes per session: Not on file  . Stress: Not on file  Relationships  . Social connections:    Talks on phone: Not on file    Gets together: Not on file    Attends religious service: Not on file    Active member of club or organization: Not on file    Attends meetings of clubs or organizations: Not on file    Relationship status: Not on file  Other Topics Concern  . Not on file  Social History Narrative  . Not on file    Hospital Course:    Gloria Perez is a 57 year old female with a history of schizophrenia maintained on Prolixin injections returns to the hospital with agitation. During recent previous hospitalization, she displayed severe tremors. She received additional Prolixin decanoate injection in the ER. She does better on 50 mg of Prolixin given every 2 weeks. This hospitalization, in spite of higher dose of Prolixin, she displayed minimal tremors and Benadryl was given as PRN only. WE also started Seroquel titration which she tolerated well. She is being discharged on two antipsychotic and it will ve up to her ACT team to continue or chose one.    #Agitation, resolved  #Psychosis, improved -patient received 18.5 mg of Prolixin decanoate from ACT team on 7/18 and 25 mg in the ER on 7/23 -continue Prolixin decanoate 50 mg injections every 2 weeks, next dose on 8/11  -Benadryl 25 mg TID PRN tremors or -continue  Seroquel titration, current dose 200 mg, to replace prolixin  #Insomnia, resolved with treatment  #Fall risk -uses a walker -refused PT consult  #Labs -performed recently -EKG reviewed, NSR with QTc 437  #[facility] -follow up with [facility]    Physical Findings: AIMS: Facial and Oral Movements Muscles of Facial Expression: None, normal Lips and Perioral Area: None, normal Jaw: None, normal Tongue: None, normal,Extremity Movements Upper (arms, wrists, hands, fingers): None, normal Lower (legs, knees, ankles, toes): None, normal, Trunk Movements Neck, shoulders, hips: None, normal, Overall Severity Severity of abnormal movements (highest score from questions above): None, normal Incapacitation due to abnormal movements: None, normal Patient's awareness of abnormal  movements (rate only patient's report): No Awareness, Dental Status Current problems with teeth and/or dentures?: No Does patient usually wear dentures?: No  CIWA:  CIWA-Ar Total: 0 COWS:  COWS Total Score: 0  Musculoskeletal: Strength & Muscle Tone: decreased Gait & Station: unsteady Patient leans: N/A  Psychiatric Specialty Exam: Physical Exam  Nursing note and vitals reviewed. Psychiatric: Her speech is normal and behavior is normal. Thought content normal. Her affect is blunt. Cognition and memory are normal. She expresses impulsivity.    Review of Systems  Musculoskeletal: Positive for myalgias.  Neurological: Positive for tremors.  Psychiatric/Behavioral: Negative.   All other systems reviewed and are negative.   Blood pressure 137/85, pulse 86, temperature 98.8 F (37.1 C), resp. rate 18, height 5' (1.524 m), weight 66.7 kg (147 lb 0.8 oz), SpO2 98 %.Body mass index is 28.72 kg/m.  General Appearance: Casual  Eye Contact:  Good  Speech:  Clear and Coherent  Volume:  Normal  Mood:  Euthymic  Affect:  Flat  Thought Process:  Goal Directed and  Descriptions of Associations: Intact  Orientation:  Full (Time, Place, and Person)  Thought Content:  WDL  Suicidal Thoughts:  No  Homicidal Thoughts:  No  Memory:  Immediate;   Poor Recent;   Poor Remote;   Poor  Judgement:  Impaired  Insight:  Shallow  Psychomotor Activity:  Normal  Concentration:  Concentration: Poor and Attention Span: Poor  Recall:  Poor  Fund of Knowledge:  Fair  Language:  Fair  Akathisia:  No  Handed:  Right  AIMS (if indicated):     Assets:  Communication Skills Desire for Improvement Financial Resources/Insurance Housing Resilience Social Support  ADL's:  Intact  Cognition:  WNL  Sleep:  Number of Hours: 6.75     Have you used any form of tobacco in the last 30 days? (Cigarettes, Smokeless Tobacco, Cigars, and/or Pipes): Yes  Has this patient used any form of tobacco in the last 30 days? (Cigarettes, Smokeless Tobacco, Cigars, and/or Pipes) Yes, Yes, A prescription for an FDA-approved tobacco cessation medication was offered at discharge and the patient refused  Blood Alcohol level:  Lab Results  Component Value Date   ETH <10 05/29/2018   ETH <10 76/28/3151    Metabolic Disorder Labs:  Lab Results  Component Value Date   HGBA1C 4.8 05/04/2018   MPG 91.06 05/04/2018   MPG 88.19 05/02/2018   No results found for: PROLACTIN Lab Results  Component Value Date   CHOL 189 05/04/2018   TRIG 136 05/04/2018   HDL 79 05/04/2018   CHOLHDL 2.4 05/04/2018   VLDL 27 05/04/2018   LDLCALC 83 05/04/2018   LDLCALC 58 08/17/2017    See Psychiatric Specialty Exam and Suicide Risk Assessment completed by Attending Physician prior to discharge.  Discharge destination:  Home  Is patient on multiple antipsychotic therapies at discharge:  Yes,   Do you recommend tapering to monotherapy for antipsychotics?  Yes   Has Patient had three or more failed trials of antipsychotic monotherapy by history:  Yes,   Antipsychotic medications that previously  failed include:   1.  zyprexa., 2.  risperdal. and 3.  prolixin.  Recommended Plan for Multiple Antipsychotic Therapies: Taper to monotherapy as described:  discontinue Prolixin  Discharge Instructions    Diet - low sodium heart healthy   Complete by:  As directed    Increase activity slowly   Complete by:  As directed      Allergies as of  06/05/2018      Reactions   Lisinopril    Metformin And Related       Medication List    TAKE these medications     Indication  Cholecalciferol 1000 units tablet Take 1 tablet (1,000 Units total) by mouth daily.  Indication:  general health   diphenhydrAMINE 25 mg capsule Commonly known as:  BENADRYL Take 1 capsule (25 mg total) by mouth every 6 (six) hours as needed (tremor). What changed:    when to take this  reasons to take this  Indication:  Parkinson's Disease   fluPHENAZine decanoate 25 MG/ML injection Commonly known as:  PROLIXIN Inject 2 mLs (50 mg total) into the muscle every 14 (fourteen) days. Next dose on 06/08/2018 Start taking on:  06/08/2018 What changed:    how much to take  when to take this  additional instructions  Indication:  Schizophrenia   ibuprofen 800 MG tablet Commonly known as:  ADVIL,MOTRIN Take 800 mg by mouth 3 (three) times daily as needed for mild pain or moderate pain.  Indication:  Inflammation   naproxen 500 MG tablet Commonly known as:  NAPROSYN Take 500 mg by mouth every 12 (twelve) hours as needed for mild pain or moderate pain.  Indication:  Pain   nicotine 14 mg/24hr patch Commonly known as:  NICODERM CQ - dosed in mg/24 hours Place 14 mg onto the skin daily.  Indication:  Nicotine Addiction   omeprazole 20 MG capsule Commonly known as:  PRILOSEC Take 1 capsule (20 mg total) by mouth daily.  Indication:  Gastroesophageal Reflux Disease   QUEtiapine 200 MG tablet Commonly known as:  SEROQUEL Take 1 tablet (200 mg total) by mouth at bedtime. What changed:    medication  strength  how much to take  Indication:  Major Depressive Disorder   tiotropium 18 MCG inhalation capsule Commonly known as:  SPIRIVA Place 1 capsule (18 mcg total) into inhaler and inhale daily.  Indication:  Chronic Obstructive Lung Disease        Follow-up recommendations:  Activity:  as tolerated Diet:  low sodium heart healthy Other:  keep follow up appointments  Comments:     Signed: Orson Slick, MD 06/05/2018, 9:30 AM

## 2018-06-05 NOTE — Progress Notes (Signed)
Recreation Therapy Notes   Date: 06/05/2018  Time: 9:30 am   Location: Craft Room   Behavioral response: N/A   Intervention Topic: Self-esteem  Discussion/Intervention: Patient did not attend group.   Clinical Observations/Feedback:  Patient did not attend group.   Maleia Weems LRT/CTRS        Scorpio Fortin 06/05/2018 11:25 AM

## 2018-06-05 NOTE — Progress Notes (Signed)
  [facility] Adult Case Management Discharge Plan :  Will you be returning to the same living situation after discharge:  Yes,    At discharge, do you have transportation home?: Yes,    Do you have the ability to pay for your medications: Yes,     Release of information consent forms completed and in the chart;  Patient's signature needed at discharge.  Patient to Follow up at: Follow-up Information    [facility] Follow up.   Why:  ACTT team will follow up with you on Thursday 8/1 or Friday 8/2 Contact information: [address] [place] [address] [phone]           Next level of care provider has access to [place] and Suicide Prevention discussed: Yes,     Have you used any form of tobacco in the last 30 days? (Cigarettes, Smokeless Tobacco, Cigars, and/or Pipes): Yes  Has patient been referred to the Quitline?: Patient refused referral  Patient has been referred for addiction treatment: Yes  August Saucer, LCSW 06/05/2018, 4:43 PM

## 2018-06-05 NOTE — Plan of Care (Signed)
Patient met with the goal

## 2018-06-05 NOTE — Progress Notes (Signed)
Recreation Therapy Notes  INPATIENT RECREATION TR PLAN  Patient Details Name: Gloria Perez MRN: [id number] DOB: [birth year] Today's Date: 06/05/2018  Rec Therapy Plan Is patient appropriate for Therapeutic Recreation?: Yes Treatment times per week: at least 3 Estimated Length of Stay: 5-7 days TR Treatment/Interventions: Group participation (Comment)  Discharge Criteria Pt will be discharged from therapy if:: Discharged Treatment plan/goals/alternatives discussed and agreed upon by:: Patient/family  Discharge Summary Short term goals set: Patient will engage in groups without prompting or encouragement from LRT x3 group sessions within 5 recreation therapy group sessions Short term goals met: Not met Reason goals not met: Patient did not attend any groups Therapeutic equipment acquired: N/A Reason patient discharged from therapy: Discharge from hospital Pt/family agrees with progress & goals achieved: Yes Date patient discharged from therapy: 06/05/18   Prateek Knipple 06/05/2018, 2:55 PM

## 2018-06-05 NOTE — NC FL2 (Signed)
[facility] LEVEL OF CARE SCREENING TOOL     IDENTIFICATION  Patient Name: Gloria Perez Birthdate: 18-Nov-1960 Sex: female Admission Date (Current Location): 05/30/2018  [place] and [place] Number:   [facility] [phone] [place] and Address:   [facility] [address] [place] [address]      Provider Number:  [id number]  Attending Physician Name and Address:  Clovis Fredrickson, MD  Relative Name and Phone Number:     Marcelo Baldy, [phone] or [phone]  Current Level of Care:  Hospital Recommended Level of Care:  Family Care home Prior Approval Number:    Date Approved/Denied:   PASRR Number:    Discharge Plan:      Current Diagnoses: Patient Active Problem List   Diagnosis Date Noted  . Tobacco use disorder 05/07/2018  . Undifferentiated schizophrenia ([place]) 04/05/2018    Orientation RESPIRATION BLADDER Height & Weight    Person  Place Situation   Normal  Continent Weight: 147 lb 0.8 oz (66.7 kg) Height:  5' (152.4 cm)  BEHAVIORAL SYMPTOMS/MOOD NEUROLOGICAL BOWEL NUTRITION STATUS     None  Continent    AMBULATORY STATUS COMMUNICATION OF NEEDS Skin    Independent  Verbal  Normal                       Personal Care Assistance Level of Assistance     Independent         Functional Limitations Info             SPECIAL CARE FACTORS FREQUENCY     Psychotropic medications                  Contractures  N/A    Additional Factors Info           Current Medications (06/05/2018):  This is the current hospital active medication list Current Facility-Administered Medications  Medication Dose Route Frequency Provider Last Rate Last Dose  . acetaminophen (TYLENOL) tablet 650 mg  650 mg Oral Q6H PRN Clapacs, Madie Reno, MD   650 mg at 06/04/18 2005  . alum & mag hydroxide-simeth (MAALOX/MYLANTA) 200-200-20 MG/5ML suspension 30 mL  30 mL Oral Q4H PRN Clapacs, Madie Reno, MD   30 mL at 06/04/18  2017  . diclofenac (VOLTAREN) EC tablet 75 mg  75 mg Oral BID Pucilowska, Jolanta B, MD   75 mg at 06/04/18 1745  . diphenhydrAMINE (BENADRYL) capsule 25 mg  25 mg Oral Q6H PRN Pucilowska, Jolanta B, MD      . Derrill Memo ON 06/08/2018] fluPHENAZine decanoate (PROLIXIN) injection 50 mg  50 mg Intramuscular Q14 Days Pucilowska, Jolanta B, MD      . magnesium hydroxide (MILK OF MAGNESIA) suspension 30 mL  30 mL Oral Daily PRN Clapacs, John T, MD      . QUEtiapine (SEROQUEL) tablet 200 mg  200 mg Oral QHS Pucilowska, Jolanta B, MD   200 mg at 06/04/18 2120  . temazepam (RESTORIL) capsule 15 mg  15 mg Oral QHS PRN He, Jun, MD   15 mg at 06/04/18 2120     Discharge Medications: TAKE these medications     Indication  Cholecalciferol 1000 units tablet Take 1 tablet (1,000 Units total) by mouth daily.  Indication:  general health   diphenhydrAMINE 25 mg capsule Commonly known as:  BENADRYL Take 1 capsule (25 mg total) by mouth every 6 (six) hours as needed (tremor). What changed:  when to take this  reasons to take this  Indication:  Parkinson's Disease   fluPHENAZine decanoate 25 MG/ML injection Commonly known as:  PROLIXIN Inject 2 mLs (50 mg total) into the muscle every 14 (fourteen) days. Next dose on 06/08/2018 Start taking on:  06/08/2018 What changed:    how much to take  when to take this  additional instructions  Indication:  Schizophrenia   ibuprofen 800 MG tablet Commonly known as:  ADVIL,MOTRIN Take 800 mg by mouth 3 (three) times daily as needed for mild pain or moderate pain.  Indication:  Inflammation   naproxen 500 MG tablet Commonly known as:  NAPROSYN Take 500 mg by mouth every 12 (twelve) hours as needed for mild pain or moderate pain.  Indication:  Pain   nicotine 14 mg/24hr patch Commonly known as:  NICODERM CQ - dosed in mg/24 hours Place 14 mg onto the skin daily.  Indication:  Nicotine Addiction   omeprazole 20 MG capsule Commonly known as:   PRILOSEC Take 1 capsule (20 mg total) by mouth daily.  Indication:  Gastroesophageal Reflux Disease   QUEtiapine 200 MG tablet Commonly known as:  SEROQUEL Take 1 tablet (200 mg total) by mouth at bedtime. What changed:    medication strength  how much to take  Indication:  Major Depressive Disorder   tiotropium 18 MCG inhalation capsule Commonly known as:  SPIRIVA Place 1 capsule (18 mcg total) into inhaler and inhale daily.  Indication:  Chronic Obstructive Lung Disease       Relevant Imaging Results:  Relevant Lab Results:   Additional Information    August Saucer, LCSW

## 2018-06-05 NOTE — Progress Notes (Signed)
Patient denies SI/HI, denies A/V hallucinations. Patient verbalizes understanding of discharge instructions, follow up care and prescriptions. Patient given all belongings from [facility] locker. Patient escorted out by staff, transported by group home personal.

## 2018-06-06 ENCOUNTER — Encounter: Payer: Self-pay | Admitting: Emergency Medicine

## 2018-06-06 ENCOUNTER — Emergency Department
Admission: EM | Admit: 2018-06-06 | Discharge: 2018-06-06 | Disposition: A | Discharge status: Home / Self Care | Payer: Medicare (Managed Care) | Attending: Student in an Organized Health Care Education/Training Program | Admitting: Student in an Organized Health Care Education/Training Program

## 2018-06-06 ENCOUNTER — Other Ambulatory Visit: Payer: Self-pay

## 2018-06-06 DIAGNOSIS — Z046 Encounter for general psychiatric examination, requested by authority: Secondary | ICD-10-CM | POA: Diagnosis present

## 2018-06-06 DIAGNOSIS — F17210 Nicotine dependence, cigarettes, uncomplicated: Secondary | ICD-10-CM | POA: Insufficient documentation

## 2018-06-06 DIAGNOSIS — F17200 Nicotine dependence, unspecified, uncomplicated: Secondary | ICD-10-CM | POA: Insufficient documentation

## 2018-06-06 DIAGNOSIS — I509 Heart failure, unspecified: Secondary | ICD-10-CM | POA: Insufficient documentation

## 2018-06-06 DIAGNOSIS — F203 Undifferentiated schizophrenia: Secondary | ICD-10-CM | POA: Diagnosis not present

## 2018-06-06 DIAGNOSIS — I110 Hypertensive heart disease with heart failure: Secondary | ICD-10-CM | POA: Insufficient documentation

## 2018-06-06 DIAGNOSIS — J449 Chronic obstructive pulmonary disease, unspecified: Secondary | ICD-10-CM | POA: Diagnosis not present

## 2018-06-06 DIAGNOSIS — Z79899 Other long term (current) drug therapy: Secondary | ICD-10-CM | POA: Diagnosis not present

## 2018-06-06 DIAGNOSIS — R451 Restlessness and agitation: Secondary | ICD-10-CM | POA: Insufficient documentation

## 2018-06-06 LAB — COMPREHENSIVE METABOLIC PANEL
ALBUMIN: 4.1 g/dL (ref 3.5–5.0)
ALT: 29 U/L (ref 0–44)
AST: 34 U/L (ref 15–41)
Alkaline Phosphatase: 81 U/L (ref 38–126)
Anion gap: 8 (ref 5–15)
BUN: 12 mg/dL (ref 6–20)
CHLORIDE: 102 mmol/L (ref 98–111)
CO2: 27 mmol/L (ref 22–32)
CREATININE: 0.68 mg/dL (ref 0.44–1.00)
Calcium: 9.3 mg/dL (ref 8.9–10.3)
Glucose, Bld: 93 mg/dL (ref 70–99)
Potassium: 4 mmol/L (ref 3.5–5.1)
SODIUM: 137 mmol/L (ref 135–145)
Total Bilirubin: 0.6 mg/dL (ref 0.3–1.2)
Total Protein: 7.2 g/dL (ref 6.5–8.1)

## 2018-06-06 LAB — URINE DRUG SCREEN, QUALITATIVE (ARMC ONLY)
Amphetamines, Ur Screen: NOT DETECTED
BENZODIAZEPINE, UR SCRN: POSITIVE — AB
Barbiturates, Ur Screen: NOT DETECTED
Cannabinoid 50 Ng, Ur ~~LOC~~: NOT DETECTED
Cocaine Metabolite,Ur ~~LOC~~: NOT DETECTED
MDMA (ECSTASY) UR SCREEN: NOT DETECTED
METHADONE SCREEN, URINE: NOT DETECTED
OPIATE, UR SCREEN: NOT DETECTED
Phencyclidine (PCP) Ur S: NOT DETECTED
Tricyclic, Ur Screen: NOT DETECTED

## 2018-06-06 LAB — CBC
HEMATOCRIT: 36.1 % (ref 35.0–47.0)
HEMOGLOBIN: 12.1 g/dL (ref 12.0–16.0)
MCH: 29.9 pg (ref 26.0–34.0)
MCHC: 33.5 g/dL (ref 32.0–36.0)
MCV: 89.3 fL (ref 80.0–100.0)
Platelets: 269 10*3/uL (ref 150–440)
RBC: 4.05 MIL/uL (ref 3.80–5.20)
RDW: 14.2 % (ref 11.5–14.5)
WBC: 11.1 10*3/uL — AB (ref 3.6–11.0)

## 2018-06-06 LAB — ETHANOL

## 2018-06-06 MED ORDER — IBUPROFEN 400 MG PO TABS
400.0000 mg | ORAL_TABLET | Freq: Once | ORAL | Status: AC
  Administered 2018-06-06: 400 mg via ORAL

## 2018-06-06 MED ORDER — IBUPROFEN 400 MG PO TABS
ORAL_TABLET | ORAL | Status: AC
  Filled 2018-06-06: qty 1

## 2018-06-06 NOTE — ED Provider Notes (Signed)
[facility] Emergency Department Provider Note    First MD Initiated Contact with Patient 06/06/18 1336     (approximate)  I have reviewed the triage vital signs and the nursing notes.   HISTORY  Chief Complaint behavioral evaluation    HPI Jenin Satori Krabill is a 57 y.o. female 1 and is facility with extensive past medical history presents to the ER with chief complaint and evaluation of agitation, outbursts and paranoia.  Patient is uncertain as to why she is here in the ER.  Denies any pain.  No changes to any medications.  Denies any discomfort.  States that she is been compliant with her medications.  Patient was recently admitted to inpatient psych.  Does appear more manic and disorganized at this time.    Past Medical History:  Diagnosis Date  . Abnormal thyroid blood test   . Acute left-sided low back pain without sciatica   . Asthma   . B12 deficiency   . CHF (congestive heart failure) ([place])   . Chronic pain   . Contusion of lower back   . COPD (chronic obstructive pulmonary disease) ([place])   . Falling episodes   . GERD (gastroesophageal reflux disease)   . Hepatitis   . Hypertension   . Nonischemic cardiomyopathy ([place])   . Physical debility   . Pre-diabetes   . Schizophrenia ([place])   . Severe obesity (BMI 35.0-35.9 with comorbidity) ([place])   . Vitamin D deficiency   . Weight loss    Family History  Problem Relation Age of Onset  . Diabetes Mother    Past Surgical History:  Procedure Laterality Date  . CESAREAN SECTION    . COLONOSCOPY    . COLONOSCOPY WITH PROPOFOL N/A 02/22/2018   Procedure: COLONOSCOPY WITH PROPOFOL;  Surgeon: [place], Benay Pike, MD;  Location: [facility] ENDOSCOPY;  Service: Gastroenterology;  Laterality: N/A;  . Right arm surgery    . TUBAL LIGATION     Patient Active Problem List   Diagnosis Date Noted  . Tobacco use disorder 05/07/2018  . Undifferentiated schizophrenia ([place]) 04/05/2018      Prior to Admission  medications   Medication Sig Start Date End Date Taking? Authorizing Provider  Cholecalciferol 1000 units tablet Take 1 tablet (1,000 Units total) by mouth daily. 05/17/18   Pucilowska, Herma Ard B, MD  diphenhydrAMINE (BENADRYL) 25 mg capsule Take 1 capsule (25 mg total) by mouth every 6 (six) hours as needed (tremor). 06/05/18   Pucilowska, Herma Ard B, MD  fluPHENAZine decanoate (PROLIXIN) 25 MG/ML injection Inject 2 mLs (50 mg total) into the muscle every 14 (fourteen) days. Next dose on 06/08/2018 06/08/18   Pucilowska, Herma Ard B, MD  ibuprofen (ADVIL,MOTRIN) 800 MG tablet Take 800 mg by mouth 3 (three) times daily as needed for mild pain or moderate pain.    [provider]  naproxen (NAPROSYN) 500 MG tablet Take 500 mg by mouth every 12 (twelve) hours as needed for mild pain or moderate pain.    [provider]  nicotine (NICODERM CQ - DOSED IN MG/24 HOURS) 14 mg/24hr patch Place 14 mg onto the skin daily.    [provider]  omeprazole (PRILOSEC) 20 MG capsule Take 1 capsule (20 mg total) by mouth daily. 05/17/18   Pucilowska, Jolanta B, MD  QUEtiapine (SEROQUEL) 200 MG tablet Take 1 tablet (200 mg total) by mouth at bedtime. 06/05/18   Pucilowska, Herma Ard B, MD  tiotropium (SPIRIVA) 18 MCG inhalation capsule Place 1 capsule (18  mcg total) into inhaler and inhale daily. 05/17/18   Pucilowska, Wardell Honour, MD    Allergies Lisinopril and Metformin and related    Social History Social History   Tobacco Use  . Smoking status: Current Every Day Smoker    Packs/day: 0.50    Years: 35.00    Pack years: 17.50    Types: Cigarettes  . Smokeless tobacco: Never Used  Substance Use Topics  . Alcohol use: Not Currently  . Drug use: Not Currently    Review of Systems Patient denies headaches, rhinorrhea, blurry vision, numbness, shortness of breath, chest pain, edema, cough, abdominal pain, nausea, vomiting, diarrhea, dysuria, fevers, rashes or hallucinations unless otherwise  stated above in HPI. ____________________________________________   PHYSICAL EXAM:  VITAL SIGNS: Vitals:   06/06/18 1302  BP: (!) 151/89  Pulse: 94  Resp: 18  Temp: 98.8 F (37.1 C)  SpO2: 100%    Constitutional: Alert and oriented. Singing hymns in the ER and minimally participatory with exam and HPI Eyes: Conjunctivae are normal.  Head: Atraumatic. Nose: No congestion/rhinnorhea. Mouth/Throat: Mucous membranes are moist.   Neck: No stridor. Painless ROM.  Cardiovascular: Normal rate, regular rhythm. Grossly normal heart sounds.  Good peripheral circulation. Respiratory: Normal respiratory effort.  No retractions. Lungs CTAB. Gastrointestinal: Soft and nontender. No distention. No abdominal bruits. No CVA tenderness. Genitourinary: deferred Musculoskeletal: No lower extremity tenderness nor edema.  No joint effusions. Neurologic:  Normal speech and language. No gross focal neurologic deficits are appreciated. No facial droop Skin:  Skin is warm, dry and intact. No rash noted. Psychiatric: disorganized, singing hymns and uncooperative with exam____________________________________________   LABS (all labs ordered are listed, but only abnormal results are displayed)  Results for orders placed or performed during the hospital encounter of 06/06/18 (from the past 24 hour(s))  Comprehensive metabolic panel     Status: None   Collection Time: 06/06/18  1:08 PM  Result Value Ref Range   Sodium 137 135 - 145 mmol/L   Potassium 4.0 3.5 - 5.1 mmol/L   Chloride 102 98 - 111 mmol/L   CO2 27 22 - 32 mmol/L   Glucose, Bld 93 70 - 99 mg/dL   BUN 12 6 - 20 mg/dL   Creatinine, Ser 0.68 0.44 - 1.00 mg/dL   Calcium 9.3 8.9 - 10.3 mg/dL   Total Protein 7.2 6.5 - 8.1 g/dL   Albumin 4.1 3.5 - 5.0 g/dL   AST 34 15 - 41 U/L   ALT 29 0 - 44 U/L   Alkaline Phosphatase 81 38 - 126 U/L   Total Bilirubin 0.6 0.3 - 1.2 mg/dL   GFR calc non Af Amer >60 >60 mL/min   GFR calc Af Amer >60 >60  mL/min   Anion gap 8 5 - 15  Ethanol     Status: None   Collection Time: 06/06/18  1:08 PM  Result Value Ref Range   Alcohol, Ethyl (B) <10 <10 mg/dL  cbc     Status: Abnormal   Collection Time: 06/06/18  1:08 PM  Result Value Ref Range   WBC 11.1 (H) 3.6 - 11.0 K/uL   RBC 4.05 3.80 - 5.20 MIL/uL   Hemoglobin 12.1 12.0 - 16.0 g/dL   HCT 36.1 35.0 - 47.0 %   MCV 89.3 80.0 - 100.0 fL   MCH 29.9 26.0 - 34.0 pg   MCHC 33.5 32.0 - 36.0 g/dL   RDW 14.2 11.5 - 14.5 %   Platelets 269 150 - 440  K/uL  Urine Drug Screen, Qualitative     Status: Abnormal   Collection Time: 06/06/18  1:08 PM  Result Value Ref Range   Tricyclic, Ur Screen NONE DETECTED NONE DETECTED   Amphetamines, Ur Screen NONE DETECTED NONE DETECTED   MDMA (Ecstasy)Ur Screen NONE DETECTED NONE DETECTED   Cocaine Metabolite,Ur [place] NONE DETECTED NONE DETECTED   Opiate, Ur Screen NONE DETECTED NONE DETECTED   Phencyclidine (PCP) Ur S NONE DETECTED NONE DETECTED   Cannabinoid 50 Ng, Ur [place] NONE DETECTED NONE DETECTED   Barbiturates, Ur Screen NONE DETECTED NONE DETECTED   Benzodiazepine, Ur Scrn POSITIVE (A) NONE DETECTED   Methadone Scn, Ur NONE DETECTED NONE DETECTED   ____________________________________________ ____________________________________________  RADIOLOGY   ____________________________________________   PROCEDURES  Procedure(s) performed:  Procedures    Critical Care performed: no ____________________________________________   INITIAL IMPRESSION / ASSESSMENT AND PLAN / ED COURSE  Pertinent labs & imaging results that were available during my care of the patient were reviewed by me and considered in my medical decision making (see chart for details).   DDX: Psychosis, delirium, medication effect, noncompliance, polysubstance abuse, Si, Hi, depression   Willia Myrna Vonseggern is a 57 y.o. who presents to the ED with for evaluation of paranoia and agitation.  Patient has extensive psych history/.   Laboratory testing was ordered to evaluation for underlying electrolyte derangement or signs of underlying organic pathology to explain today's presentation.  Based on history and physical and laboratory evaluation, it appears that the patient's presentation is 2/2 underlying psychiatric disorder and will require further evaluation and management by inpatient psychiatry.  Disposition pending psychiatric evaluation.  The patient has been evaluated at bedside by Dr. Weber Cooks, psychiatry.  Patient is clinically stable.  Not felt to be a danger to self or others.  No SI or Hi.  No indication for inpatient psychiatric admission at this time.  Appropriate for continued outpatient therapy.        As part of my medical decision making, I reviewed the following data within the [facility] notes reviewed and incorporated, Labs reviewed, notes from prior ED visits.   ____________________________________________   FINAL CLINICAL IMPRESSION(S) / ED DIAGNOSES  Final diagnoses:  Agitation      NEW MEDICATIONS STARTED DURING THIS VISIT:  New Prescriptions   No medications on file     Note:  This document was prepared using Dragon voice recognition software and may include unintentional dictation errors.    Merlyn Lot, MD 06/06/18 [id number]

## 2018-06-06 NOTE — ED Notes (Signed)
BEHAVIORAL HEALTH ROUNDING Patient sleeping: No. Patient alert and oriented: yes Behavior appropriate: Yes.  ; If no, describe:  Nutrition and fluids offered: yes Toileting and hygiene offered: Yes  Sitter present: q15 minute observations and security monitoring Law enforcement present: Yes    

## 2018-06-06 NOTE — Discharge Instructions (Signed)
Return to the ER for any new or worsening symptoms that concern you.

## 2018-06-06 NOTE — ED Notes (Signed)

## 2018-06-06 NOTE — ED Notes (Addendum)
Called back to [facility] and spoke with a female employee  - informed him that this pt is ready to be picked up - she has been evaluated by the psychiatrist and the EDP and she has been cleared for discharge  Number used [phone]
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
## 2018-06-06 NOTE — ED Triage Notes (Signed)
Pt via ems from [facility] for agitation, outbursts, paranoia. Pt walking around and difficult to contain while in triage and waiting room. Pt alert and restless during triage.

## 2018-06-06 NOTE — ED Notes (Signed)
VOL  PENDING  D/C  SEEN  BY  DR  [facility] MD

## 2018-06-06 NOTE — ED Notes (Signed)
I have attempted to call [facility] to inform them that this pt is ready for discharge -  Phone rang and rang and would then just hang up x2

## 2018-06-09 ENCOUNTER — Emergency Department (HOSPITAL_COMMUNITY)
Admission: EM | Admit: 2018-06-09 | Discharge: 2018-06-10 | Disposition: A | Discharge status: Other Acute Inpatient Hospital | Payer: Medicare (Managed Care) | Attending: Emergency Medicine | Admitting: Emergency Medicine

## 2018-06-09 ENCOUNTER — Other Ambulatory Visit: Payer: Self-pay

## 2018-06-09 ENCOUNTER — Encounter (HOSPITAL_COMMUNITY): Payer: Self-pay | Admitting: Emergency Medicine

## 2018-06-09 DIAGNOSIS — F3131 Bipolar disorder, current episode depressed, mild: Secondary | ICD-10-CM | POA: Diagnosis not present

## 2018-06-09 DIAGNOSIS — F919 Conduct disorder, unspecified: Secondary | ICD-10-CM

## 2018-06-09 DIAGNOSIS — F319 Bipolar disorder, unspecified: Secondary | ICD-10-CM | POA: Diagnosis present

## 2018-06-09 DIAGNOSIS — F209 Schizophrenia, unspecified: Secondary | ICD-10-CM | POA: Diagnosis not present

## 2018-06-09 HISTORY — DX: Anxiety disorder, unspecified: F41.9

## 2018-06-09 HISTORY — DX: Mental disorder, not otherwise specified: F99

## 2018-06-09 HISTORY — DX: Unspecified viral hepatitis C without hepatic coma: B19.20

## 2018-06-09 HISTORY — DX: Other psychoactive substance abuse, in remission: F19.11

## 2018-06-09 LAB — COMPREHENSIVE METABOLIC PANEL
ALK PHOS: 65 U/L (ref 38–126)
ALT: 23 U/L (ref 0–44)
AST: 25 U/L (ref 15–41)
Albumin: 3.5 g/dL (ref 3.5–5.0)
Anion gap: 8 (ref 5–15)
BILIRUBIN TOTAL: 0.8 mg/dL (ref 0.3–1.2)
BUN: 11 mg/dL (ref 6–20)
CO2: 24 mmol/L (ref 22–32)
CREATININE: 0.74 mg/dL (ref 0.44–1.00)
Calcium: 8.9 mg/dL (ref 8.9–10.3)
Chloride: 105 mmol/L (ref 98–111)
GFR calc Af Amer: >60 mL/min (ref >60)
GLUCOSE: 91 mg/dL (ref 70–99)
Potassium: 3.4 mmol/L — ABNORMAL LOW (ref 3.5–5.1)
Sodium: 137 mmol/L (ref 135–145)
TOTAL PROTEIN: 6.4 g/dL — AB (ref 6.5–8.1)

## 2018-06-09 LAB — CBC WITH DIFFERENTIAL/PLATELET
BASOS ABS: 0 10*3/uL (ref 0.0–0.1)
Basophils Relative: 0 %
EOS ABS: 0.6 10*3/uL (ref 0.0–0.7)
Eosinophils Relative: 6 %
HCT: 34.4 % — ABNORMAL LOW (ref 36.0–46.0)
HEMOGLOBIN: 11.4 g/dL — AB (ref 12.0–15.0)
LYMPHS ABS: 2.2 10*3/uL (ref 0.7–4.0)
LYMPHS PCT: 21 %
MCH: 29.5 pg (ref 26.0–34.0)
MCHC: 33.1 g/dL (ref 30.0–36.0)
MCV: 89.1 fL (ref 78.0–100.0)
Monocytes Absolute: 0.5 10*3/uL (ref 0.1–1.0)
Monocytes Relative: 5 %
NEUTROS PCT: 68 %
Neutro Abs: 7.3 10*3/uL (ref 1.7–7.7)
Platelets: 235 10*3/uL (ref 150–400)
RBC: 3.86 MIL/uL — AB (ref 3.87–5.11)
RDW: 13.9 % (ref 11.5–15.5)
WBC: 10.6 10*3/uL — ABNORMAL HIGH (ref 4.0–10.5)

## 2018-06-09 LAB — PREGNANCY, URINE: PREG TEST UR: NEGATIVE

## 2018-06-09 LAB — RAPID URINE DRUG SCREEN, HOSP PERFORMED
Amphetamines: NOT DETECTED
BARBITURATES: NOT DETECTED
Benzodiazepines: NOT DETECTED
Cocaine: NOT DETECTED
Opiates: NOT DETECTED
Tetrahydrocannabinol: NOT DETECTED

## 2018-06-09 LAB — ETHANOL

## 2018-06-09 NOTE — ED Notes (Signed)
Security wanded pt. In triage.

## 2018-06-09 NOTE — ED Triage Notes (Signed)
[facility] brings [facility] from [facility] [place]. Pt lives [facility].  Pt was very  anxious, nervous.  Pt states she was about to hurt someone because she was tired.  Pt was given Invega 234 mg before leaving clinic. Trenton Gammon [phone] or [place] caregiver [phone]
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
## 2018-06-09 NOTE — ED Notes (Signed)
Behavioral Health called and states pt denies any SIHI and states does not recommend inpatient treatment.

## 2018-06-09 NOTE — ED Notes (Signed)
In traige pt continues to ask to go to sleep, " I am sleepy, I need to sleep, I am tired"

## 2018-06-09 NOTE — Progress Notes (Signed)
Pt meets inpatient criteria per Patriciaann Clan, PA. Referral information has been sent to the following hospitals for review: [facility]   CSW will continue to assist with placement needs.   Audree Camel, LCSW, [place] Disposition [facility]/TTS [phone]

## 2018-06-09 NOTE — ED Provider Notes (Addendum)
[facility] Emergency Department Provider Note MRN:  [id number]  Arrival date & time: 06/09/18     Chief Complaint   Anxiety   History of Present Illness   Gloria Perez is a 57 y.o. year-old female with a history of schizophrenia presenting to the ED with chief complaint of anxiety.  Patient explains that she is been taking care of people at her care facility, working very hard, and despite this they would not give her her medicines today.  This made her very upset and anxious because she needs her medicine.  [facility], patient has been acting erratically for the past several days, being disruptive, cursing, being destructive.  Care provider states that she has been taking her medications normally.  I was unable to obtain an accurate HPI, PMH, or ROS due to the patient's psychiatric condition.   Review of Systems  A complete 10 system review of systems was obtained and all systems are negative except as noted in the HPI and PMH.   Patient's Health History    Past Medical History:  Diagnosis Date  . Anxiety   . Drug abuse in remission   . Hepatitis C   . Mental health disorder     Past Surgical History:  Procedure Laterality Date  . CESAREAN SECTION      History reviewed. No pertinent family history.  Social History   Socioeconomic History  . Marital status: Single    Spouse name: Not on file  . Number of children: Not on file  . Years of education: Not on file  . Highest education level: Not on file  Occupational History  . Not on file  Social Needs  . Financial resource strain: Not on file  . Food insecurity:    Worry: Not on file    Inability: Not on file  . Transportation needs:    Medical: Not on file    Non-medical: Not on file  Tobacco Use  . Smoking status: Former Research scientist (life sciences)  . Smokeless tobacco: Never Used  Substance and Sexual Activity  . Alcohol use: Not Currently  . Drug use: Not Currently    . Sexual activity: Not on file  Lifestyle  . Physical activity:    Days per week: Not on file    Minutes per session: Not on file  . Stress: Not on file  Relationships  . Social connections:    Talks on phone: Not on file    Gets together: Not on file    Attends religious service: Not on file    Active member of club or organization: Not on file    Attends meetings of clubs or organizations: Not on file    Relationship status: Not on file  . Intimate partner violence:    Fear of current or ex partner: Not on file    Emotionally abused: Not on file    Physically abused: Not on file    Forced sexual activity: Not on file  Other Topics Concern  . Not on file  Social History Narrative  . Not on file     Physical Exam  Vital Signs and Nursing Notes reviewed Vitals:   06/09/18 1256  BP: (!) 150/81  Pulse: 92  Resp: 14  Temp: 98.2 F (36.8 C)  SpO2: 99%    CONSTITUTIONAL: Well-appearing, NAD NEURO:  Alert and oriented x 3, no focal deficits EYES:  eyes equal and reactive ENT/NECK:  no LAD,  no JVD CARDIO: Regular rate, well-perfused, normal S1 and S2 PULM:  CTAB no wheezing or rhonchi GI/GU:  normal bowel sounds, non-distended, non-tender MSK/SPINE:  No gross deformities, no edema SKIN:  no rash, atraumatic PSYCH: Disorganized speech and behavior  Diagnostic and Interventional Summary    EKG Interpretation  Date/Time:    Ventricular Rate:    PR Interval:    QRS Duration:   QT Interval:    QTC Calculation:   R Axis:     Text Interpretation:        Labs Reviewed  COMPREHENSIVE METABOLIC PANEL - Abnormal; Notable for the following components:      Result Value   Potassium 3.4 (*)    Total Protein 6.4 (*)    All other components within normal limits  CBC WITH DIFFERENTIAL/PLATELET - Abnormal; Notable for the following components:   WBC 10.6 (*)    RBC 3.86 (*)    Hemoglobin 11.4 (*)    HCT 34.4 (*)    All other components within normal limits  ETHANOL   RAPID URINE DRUG SCREEN, HOSP PERFORMED  PREGNANCY, URINE    No orders to display    Medications - No data to display   Procedures Critical Care  ED Course and Medical Decision Making  I have reviewed the triage vital signs and the nursing notes.  Pertinent labs & imaging results that were available during my care of the patient were reviewed by me and considered in my medical decision making (see below for details). Clinical Course as of Jun 09 2226  Fri Jun 09, 2830  9461 57 year old female history of schizophrenia presenting with reported anxiety, frustration after not getting her medications.  Per care provider significant change in her behavior over the past few days, disruptive, destructive.  Disorganized speech on exam, very tangential.  Will consult TTS to consider inpatient management of recent behavioral change.   [MB]  2226 Discussed case with TTS.  Had a few discussions [facility], patient has been very abnormally only, they are uncomfortable bringing her back to this rest home.  Will admit to [facility] to evaluate for need for medication regimen.   [MB]    Clinical Course User Index [MB] Maudie Flakes, MD    After close discussion with DTS, as well as patient's care facility, will admit to geriatric psych for behavioral disturbance, review of patient's need for medication.  Patient currently here voluntarily, no indication for involuntary commitment at this time.  Barth Kirks. Sedonia Small, [place] [email or web address]  Final Clinical Impressions(s) / ED Diagnoses     ICD-10-CM   1. Behavior disturbance F91.9   2. Schizophrenia, unspecified type [facility]) F20.9     ED Discharge Orders    None         Maudie Flakes, MD 06/09/18 2057    Maudie Flakes, MD 06/09/18 2227

## 2018-06-10 LAB — URINALYSIS, ROUTINE W REFLEX MICROSCOPIC
Bilirubin Urine: NEGATIVE
Glucose, UA: NEGATIVE mg/dL
HGB URINE DIPSTICK: NEGATIVE
Ketones, ur: NEGATIVE mg/dL
Leukocytes, UA: NEGATIVE
Nitrite: NEGATIVE
PH: 7 (ref 5.0–8.0)
Protein, ur: NEGATIVE mg/dL
Specific Gravity, Urine: 1.009 (ref 1.005–1.030)

## 2018-06-10 NOTE — BH Assessment (Signed)
[place] Assessment Progress Note   Nurse Durene Romans asked clinician to contact [facility] to make sure that they do not give patient's bed away.  Clinician talked to intake nurse [facility] there and she said "not to worry, that bed isn't going anywhere."

## 2018-06-10 NOTE — Progress Notes (Signed)
LCSW was notified by someone at [facility] that patient has another chart that is going to be merged with this chart. The other chart's MRN is [id number].  Herbert Moors MSW, LCSW, LCAS Clinical Social Worker 06/10/2018 7:58 AM

## 2018-06-10 NOTE — ED Notes (Signed)
Call from [facility] is considering   They request EKG, UA   [facility] also requests IVC paperwork to be faxed to [phone]

## 2018-06-10 NOTE — Progress Notes (Signed)
Patient meets criteria for inpatient treatment.CSW faxed referrals to the following facilities for review. [place], [place], [place], [place], [place], [place], [place], [place], [place], [place], [place], [place], [place], [place], [place], [place], [place], [place], [place], [place], [place], [place], [place], [place], [place]. [place], [place] and [place].   TTS will continue to seek bed placement.     Herbert Moors, MSW, LCSW, LCAS 06/10/2018 10:42 AM

## 2018-06-10 NOTE — BH Assessment (Signed)
[place] Assessment Progress Note     TTS attempted to see patient, but she is currently sleeping

## 2018-06-10 NOTE — ED Notes (Signed)
Pt is decompensating [place]  Reporting unable to walk,  Reports she thought she was to be here for 3 days then return to [facility] assisted living  She is difficult to console

## 2018-06-10 NOTE — ED Notes (Signed)
All requested information from [facility] has been faxed to [facility]  Awaiting decision information

## 2018-06-10 NOTE — ED Notes (Signed)
New IVC Paperwork faxed to [facility] at about 1810

## 2018-06-10 NOTE — ED Notes (Signed)
Py discharged to [facility] for transport to [facility] where she has a bed She is returned with [facility] per their reports that pt cannot be transported with [facility] paperwork- She has paperwork from [facility] also  Dr Lita Mains made aware, [facility] made aware and paperwork is resubmitted

## 2018-06-10 NOTE — ED Notes (Signed)
Awaiting placement

## 2018-06-10 NOTE — ED Notes (Signed)
IVC papers Faxed to [facility].

## 2018-06-10 NOTE — ED Notes (Signed)
Meal provided 

## 2018-06-10 NOTE — Progress Notes (Addendum)
Patient has been accepted to [facility]. The report number is [id number]. The accepting doctor is Dr. Ronnald Ramp. They would like for her to arrive anytime soon. Her bed is available.   LCSW has notified patient's nurse.    Herbert Moors MSW, LCSW, LCAS Clinical Social Worker 06/10/2018 2:33 PM

## 2018-06-10 NOTE — BH Assessment (Signed)
[place] Assessment Progress Note      TTS contacted Ms. Trenton Gammon at [facility] for collateral information.  [phone].  Ms. Trenton Gammon states that patient returned from her psychiatric hospitalization to the rest home very mentally unstable.  She states that patient was talking out of her head telling people that she was dead, stating that she was going to kill people at the nursing home.  She states that patient has been soliciting sex and went to a female patient's room almost naked and was round "rubbing up on the man."  Ms. Trenton Gammon states that patient has been hollaring and cussing and just being loud.  Patient was found her room stating that she was "cleaning up, but all of her clothes were strewn all over the floor and she was destroying things in her room.  Ms. Trenton Gammon states that she has been at the nursing home for the past year and states that her current behavior is outside of her normal behavior.  Ms. Trenton Gammon states that she was released prematurely from [facility].

## 2018-06-10 NOTE — BH Assessment (Signed)
[place] Assessment Progress Note      TTS reassessment:  Patient presents as alert and oriented x 3, but off by one day.  Patient states that she slept well last pm.  Patient states: "I still want to go to the hospital" and states that she prefers to go to [facility].  Patient states that she is not having suicidal or homicidal thoughts.  She states that she is not experiencing any AVH.  Patient states that she has been eating well in the ED.  Patient admits to having some behavioral disturbance at the nursing home, but states that she ddi not want to hurt anyone there.  Patient appears to have some mild cognitive delay.  TTS to staff patient with Ricky Ala, NP for final disposition.

## 2018-06-10 NOTE — Progress Notes (Signed)
[facility] called and requested the patient's IVC, EKG and Urine Analysis. LCSW contacted the patient's nurse and requested the information to be completed so that it can be faxed to [facility].   Herbert Moors MSW, LCSW, LCAS Clinical Social Worker 06/10/2018 11:45 AM

## 2018-06-10 NOTE — ED Notes (Signed)
Out of bed to [facility]  Call to [facility]   [phone] Caryl Pina, RN will call back for report

## 2018-06-14 ENCOUNTER — Encounter: Payer: Self-pay | Admitting: Emergency Medicine

## 2018-11-08 ENCOUNTER — Emergency Department
Admission: EM | Admit: 2018-11-08 | Discharge: 2018-11-08 | Disposition: A | Discharge status: Home / Self Care | Payer: Medicare Other | Attending: Emergency Medicine | Admitting: Emergency Medicine

## 2018-11-08 ENCOUNTER — Encounter: Payer: Self-pay | Admitting: Emergency Medicine

## 2018-11-08 ENCOUNTER — Other Ambulatory Visit: Payer: Self-pay

## 2018-11-08 ENCOUNTER — Emergency Department: Payer: Medicare Other

## 2018-11-08 DIAGNOSIS — I110 Hypertensive heart disease with heart failure: Secondary | ICD-10-CM | POA: Insufficient documentation

## 2018-11-08 DIAGNOSIS — R7303 Prediabetes: Secondary | ICD-10-CM | POA: Diagnosis not present

## 2018-11-08 DIAGNOSIS — Z87891 Personal history of nicotine dependence: Secondary | ICD-10-CM | POA: Insufficient documentation

## 2018-11-08 DIAGNOSIS — M25462 Effusion, left knee: Secondary | ICD-10-CM | POA: Diagnosis not present

## 2018-11-08 DIAGNOSIS — M1712 Unilateral primary osteoarthritis, left knee: Secondary | ICD-10-CM | POA: Diagnosis not present

## 2018-11-08 DIAGNOSIS — Z79899 Other long term (current) drug therapy: Secondary | ICD-10-CM | POA: Insufficient documentation

## 2018-11-08 DIAGNOSIS — J449 Chronic obstructive pulmonary disease, unspecified: Secondary | ICD-10-CM | POA: Diagnosis not present

## 2018-11-08 DIAGNOSIS — I509 Heart failure, unspecified: Secondary | ICD-10-CM | POA: Insufficient documentation

## 2018-11-08 DIAGNOSIS — M25562 Pain in left knee: Secondary | ICD-10-CM | POA: Diagnosis present

## 2018-11-08 MED ORDER — LIDOCAINE 5 % EX PTCH
1.0000 | MEDICATED_PATCH | CUTANEOUS | Status: DC
Start: 2018-11-08 — End: 2018-11-08
  Administered 2018-11-08: 1 via TRANSDERMAL
  Filled 2018-11-08: qty 1

## 2018-11-08 NOTE — ED Notes (Signed)
1419- spoke with Claiborne Billings and she states that they are on the way to pick up pt.  Will be here in 35 minutes.

## 2018-11-08 NOTE — ED Notes (Addendum)
1358-Pt remains in waiting room.  I have personally contacted [facility] [phone], [phone], [phone] x 3 and requested them to come and pick pt up.  Each time, I have been told that they are on the way. Advised the staff again, that pt continues to wait for transportation to care home.

## 2018-11-08 NOTE — ED Notes (Signed)
Spoke with care home and will find someone to come and get her

## 2018-11-08 NOTE — Discharge Instructions (Signed)
Wear Ace wrap while awake and continue previous medications.  Follow-up with orthopedic for definitive evaluation and treatment.

## 2018-11-08 NOTE — ED Notes (Signed)
[facility]oke with [facility]    Was not able to tell me the name of guardian

## 2018-11-08 NOTE — ED Provider Notes (Signed)
[facility] Emergency Department Provider Note   ____________________________________________   First MD Initiated Contact with Patient 11/08/18 [phone]     (approximate)  I have reviewed the triage vital signs and the nursing notes.   HISTORY  Chief Complaint Knee Pain    HPI Gloria Perez is a 58 y.o. female patient arrived via EMS from home care complaining of left knee pain and edema.  No provocative incident for complaint.  Complaint started yesterday and patient state increased pain with weightbearing and ambulation.  Patient rates pain as a 5/10.  Patient described pain is "achy".  No palliative measure for complaint.  Past Medical History:  Diagnosis Date  . Abnormal thyroid blood test   . Acute left-sided low back pain without sciatica   . Anxiety   . Asthma   . B12 deficiency   . CHF (congestive heart failure) ([place])   . Chronic pain   . Contusion of lower back   . COPD (chronic obstructive pulmonary disease) ([place])   . Drug abuse in remission ([place])   . Falling episodes   . GERD (gastroesophageal reflux disease)   . Hepatitis   . Hepatitis C   . Hypertension   . Mental health disorder   . Nonischemic cardiomyopathy ([place])   . Physical debility   . Pre-diabetes   . Schizophrenia ([place])   . Severe obesity (BMI 35.0-35.9 with comorbidity) ([place])   . Vitamin D deficiency   . Weight loss     Patient Active Problem List   Diagnosis Date Noted  . Tobacco use disorder 05/07/2018  . Undifferentiated schizophrenia ([place]) 04/05/2018    Past Surgical History:  Procedure Laterality Date  . CESAREAN SECTION    . COLONOSCOPY    . COLONOSCOPY WITH PROPOFOL N/A 02/22/2018   Procedure: COLONOSCOPY WITH PROPOFOL;  Surgeon: Toledo, Benay Pike, MD;  Location: [facility] ENDOSCOPY;  Service: Gastroenterology;  Laterality: N/A;  . Right arm surgery    . TUBAL LIGATION      Prior to Admission medications   Medication Sig Start Date End Date Taking?  Authorizing Provider  cholecalciferol (VITAMIN D) 1000 units tablet Take 1,000 Units by mouth daily.    [provider]  Cholecalciferol 1000 units tablet Take 1 tablet (1,000 Units total) by mouth daily. 05/17/18   Pucilowska, Wardell Honour, MD  diphenhydrAMINE (BANOPHEN) 25 mg capsule Take 25 mg by mouth 2 (two) times daily. Take 1 capsule by mouth morning and at night.    [provider]  diphenhydrAMINE (BENADRYL) 25 mg capsule Take 1 capsule (25 mg total) by mouth every 6 (six) hours as needed (tremor). 06/05/18   Pucilowska, Herma Ard B, MD  fluPHENAZine decanoate (PROLIXIN) 25 MG/ML injection Inject 2 mLs (50 mg total) into the muscle every 14 (fourteen) days. Next dose on 06/08/2018 06/08/18   Pucilowska, Herma Ard B, MD  ibuprofen (ADVIL,MOTRIN) 800 MG tablet Take 800 mg by mouth 3 (three) times daily as needed for mild pain or moderate pain.    [provider]  naproxen (NAPROSYN) 500 MG tablet Take 500 mg by mouth every 12 (twelve) hours as needed for mild pain or moderate pain.    [provider]  nicotine (NICODERM CQ - DOSED IN MG/24 HOURS) 14 mg/24hr patch Place 14 mg onto the skin daily.    [provider]  omeprazole (PRILOSEC) 20 MG capsule Take 1 capsule (20 mg total) by mouth daily. 05/17/18   Pucilowska, Wardell Honour, MD  omeprazole ([place])  20 MG capsule Take 20 mg by mouth daily.    [provider]  QUEtiapine (SEROQUEL) 200 MG tablet Take 1 tablet (200 mg total) by mouth at bedtime. 06/05/18   Pucilowska, Herma Ard B, MD  QUEtiapine (SEROQUEL) 200 MG tablet Take 200 mg by mouth at bedtime.    [provider]  tiotropium (SPIRIVA) 18 MCG inhalation capsule Place 1 capsule (18 mcg total) into inhaler and inhale daily. 05/17/18   Pucilowska, Herma Ard B, MD  tiotropium (SPIRIVA) 18 MCG inhalation capsule Place 18 mcg into inhaler and inhale daily.    [provider]    Allergies Lisinopril and Metformin and related  Family  History  Problem Relation Age of Onset  . Diabetes Mother     Social History Social History   Tobacco Use  . Smoking status: Former Research scientist (life sciences)  . Smokeless tobacco: Never Used  Substance Use Topics  . Alcohol use: Not Currently  . Drug use: Not Currently    Review of Systems  Constitutional: No fever/chills Eyes: No visual changes. ENT: No sore throat. Cardiovascular: Denies chest pain. Respiratory: Denies shortness of breath. Gastrointestinal: No abdominal pain.  No nausea, no vomiting.  No diarrhea.  No constipation. Genitourinary: Negative for dysuria. Musculoskeletal: Negative for back pain. Skin: Negative for rash. Neurological: Negative for headaches, focal weakness or numbness. Psychiatric:Anxiety mental health disorder. Endocrine:Hepatitis C, hypertension, and prediabetic. Hematological/Lymphatic: Allergic/Immunilogical: Lisinopril and metformin. ____________________________________________   PHYSICAL EXAM:  VITAL SIGNS: ED Triage Vitals [11/08/18 0826]  Enc Vitals Group     BP (!) 170/104     Pulse Rate 86     Resp      Temp 98.4 F (36.9 C)     Temp Source Oral     SpO2 98 %     Weight      Height      Head Circumference      Peak Flow      Pain Score      Pain Loc      Pain Edu?      Excl. in [place]?    Constitutional: Alert and oriented. Well appearing and in no acute distress. Neck: No stridor.  Cardiovascular: Normal rate, regular rhythm. Grossly normal heart sounds.  Good peripheral circulation.  Elevated blood pressure.  Patient not take hypertension medicine this morning. Respiratory: Normal respiratory effort.  No retractions. Lungs CTAB. Musculoskeletal: No obvious deformity to the left knee.  Moderate edema.  Crepitus with palpation anterior patella. Neurologic:  Normal speech and language. No gross focal neurologic deficits are appreciated. No gait instability. Skin:  Skin is warm, dry and intact. No rash noted. Psychiatric: Mood and  affect are normal. Speech and behavior are normal.  ____________________________________________   LABS (all labs ordered are listed, but only abnormal results are displayed)  Labs Reviewed - No data to display ____________________________________________  EKG   ____________________________________________  RADIOLOGY  ED MD interpretation:    Official radiology report(s): Dg Knee Complete 4 Views Left  Result Date: 11/08/2018 CLINICAL DATA:  Left knee pain. EXAM: LEFT KNEE - COMPLETE 4+ VIEW COMPARISON:  None. FINDINGS: Moderate joint space narrowing in the medial knee compartment with associated osteophytosis. Evidence for moderate sized suprapatellar joint effusion. Spurring and degenerative changes at the patellofemoral compartment of the knee. Atherosclerotic calcifications are present. Negative for fracture or dislocation. IMPRESSION: 1. No acute bone abnormality to the left knee. 2. Osteoarthritis in left knee, particularly along the medial knee compartment. Evidence for a suprapatellar joint effusion.  Electronically Signed   By: Markus Daft M.D.   On: 11/08/2018 09:12    ____________________________________________   PROCEDURES  Procedure(s) performed: None  Procedures  Critical Care performed: No  ____________________________________________   INITIAL IMPRESSION / ASSESSMENT AND PLAN / ED COURSE  As part of my medical decision making, I reviewed the following data within the electronic MEDICAL RECORD NUMBER    Left knee pain secondary to osteoarthritis and an effusion.  Discussed x-ray findings with patient.  Patient given discharge care instructions.  Lidoderm patch and Ace wrap applied to left knee.  Patient given discharge care instructions.  Patient advised to follow orthopedic for definitive evaluation and treatment.  Continue previous medications.     ____________________________________________   FINAL CLINICAL IMPRESSION(S) / ED DIAGNOSES  Final  diagnoses:  Effusion of left knee  Arthritis of left knee     ED Discharge Orders    None       Note:  This document was prepared using Dragon voice recognition software and may include unintentional dictation errors.    Sable Feil, PA-C 11/08/18 0930    Schuyler Amor, MD 11/08/18 [phone]

## 2018-11-08 NOTE — ED Triage Notes (Addendum)
Presents via EMS from care home with left knee pain  States pain started yesterday while walking   States she did not fall  No deformity noted but having increased pain with movement  Positive swelling

## 2019-01-19 ENCOUNTER — Encounter: Payer: Self-pay | Admitting: *Deleted

## 2019-02-02 IMAGING — DX DG CHEST 1V PORT
1 series · 1 of 1 positions shown · non-contrast
Comparison: Chest x-ray of May 03, 2018

CLINICAL DATA: Increased agitation over baseline.

EXAM:
PORTABLE CHEST 1 VIEW

[chest ap]
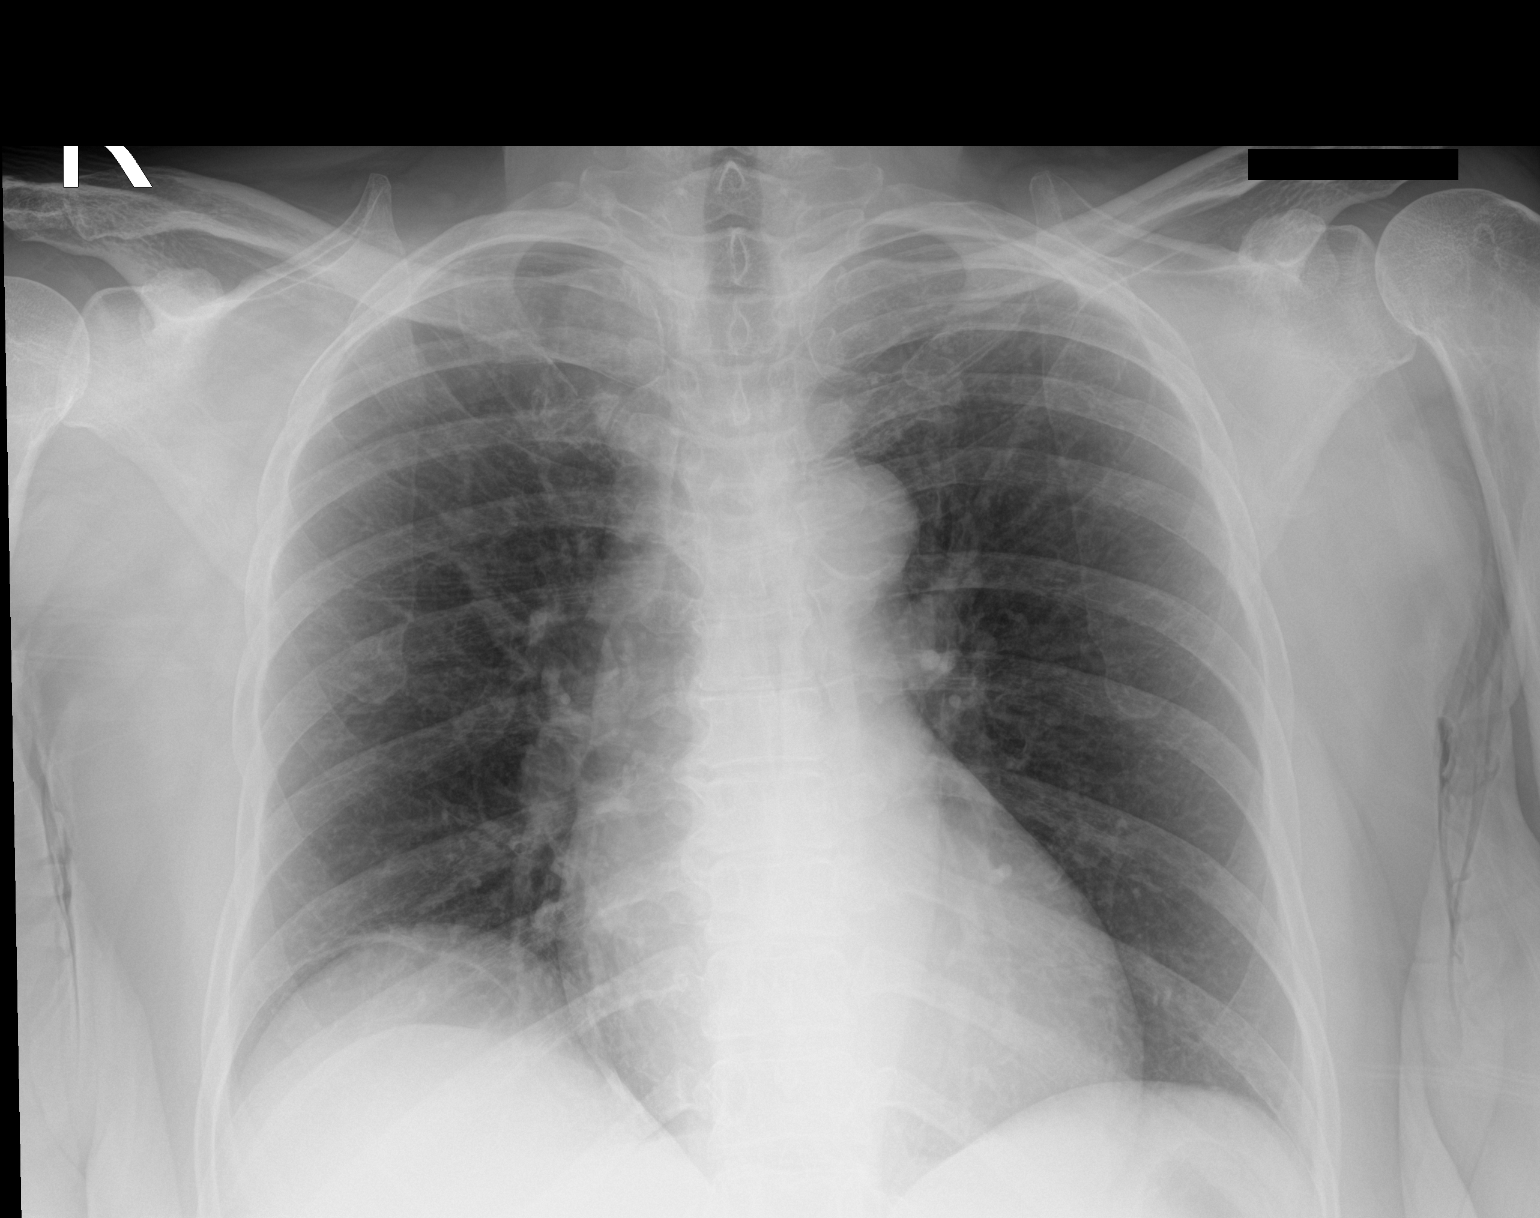

[1 of 1 positions shown; findings below may reference images not displayed]

FINDINGS: The lungs are adequately inflated and clear. The heart and pulmonary
vascularity are normal. There is calcification in the wall of the
aortic arch. There is no pleural effusion. The bony thorax exhibits
no acute abnormality.
IMPRESSION: There is no active cardiopulmonary disease.

Thoracic aortic atherosclerosis.

## 2019-03-06 ENCOUNTER — Ambulatory Visit: Payer: Medicare Other | Admitting: Gastroenterology

## 2019-06-19 ENCOUNTER — Ambulatory Visit: Payer: Medicare Other | Admitting: Gastroenterology

## 2019-06-19 ENCOUNTER — Encounter: Payer: Self-pay | Admitting: Gastroenterology

## 2019-07-15 IMAGING — DX DG KNEE COMPLETE 4+V*L*
4 series · 5 of 5 positions shown · non-contrast
Comparison: None.

CLINICAL DATA: Left knee pain.

EXAM:
LEFT KNEE - COMPLETE 4+ VIEW

[knee ap]
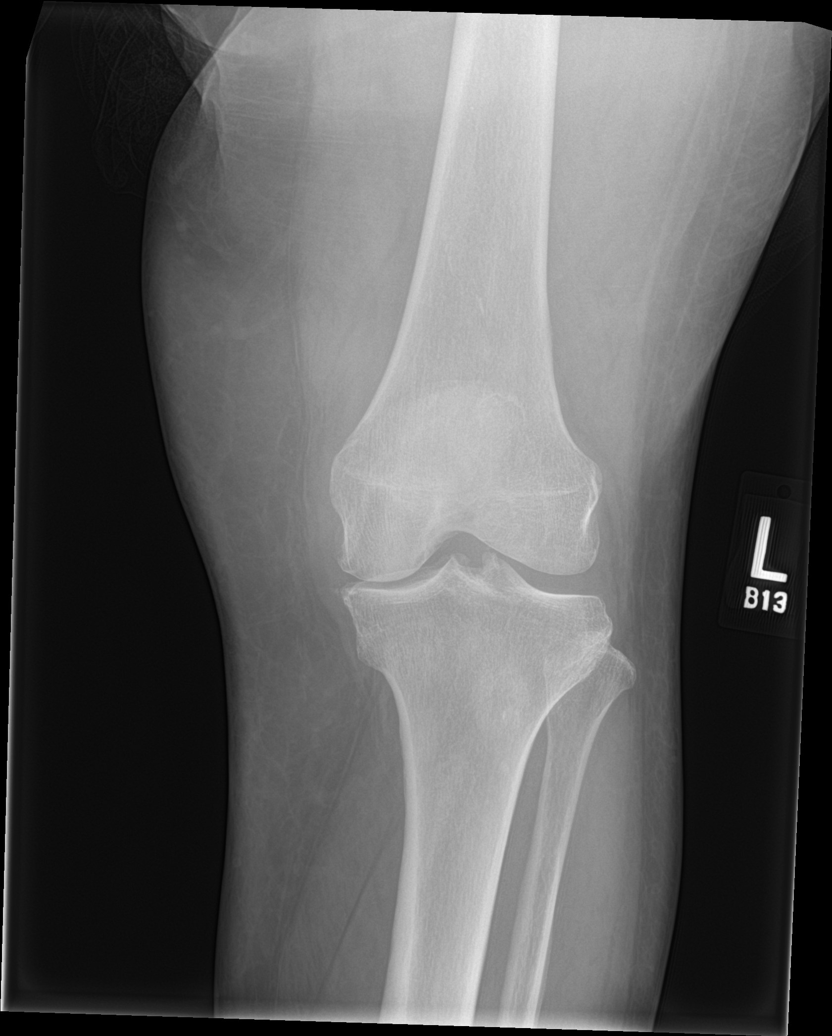

[Series 3: knee lat · 0.14mm/px · 2 of 2 slices shown]
[im 1/2]
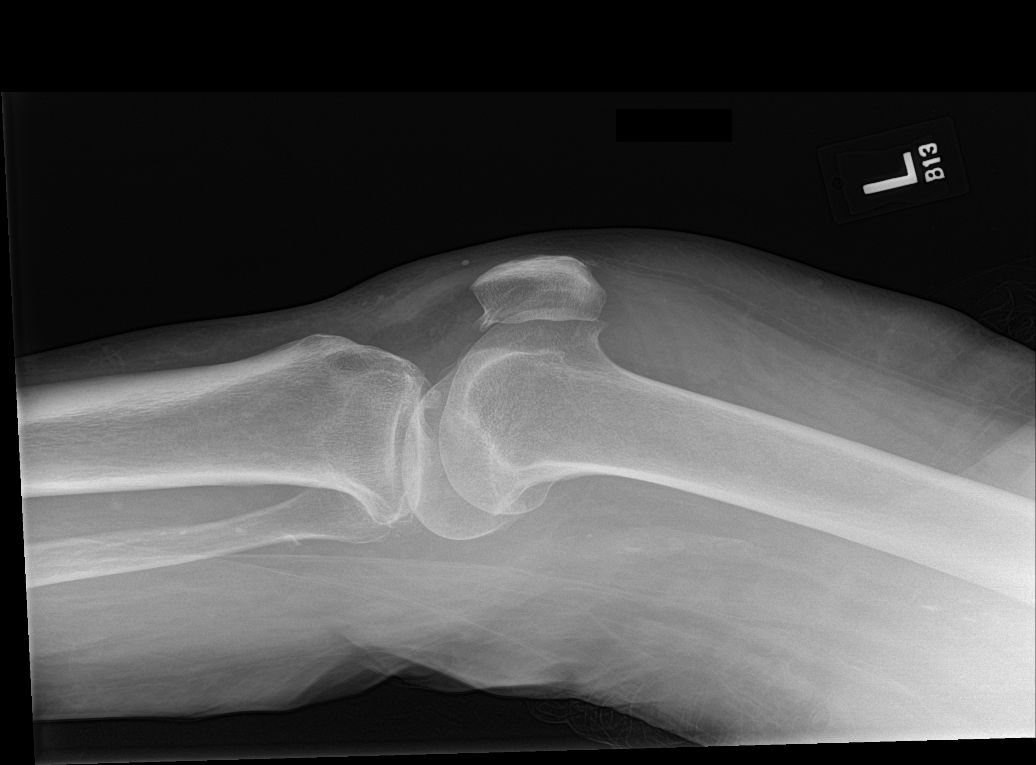
[im 2/2]
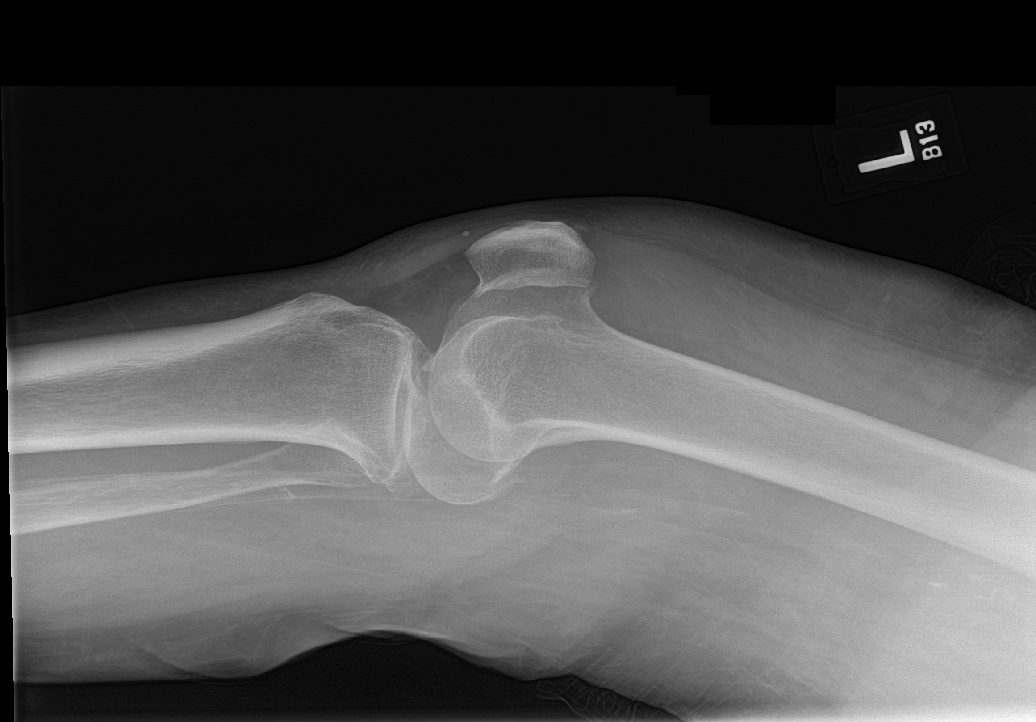

[knee obl (1 of 2)]
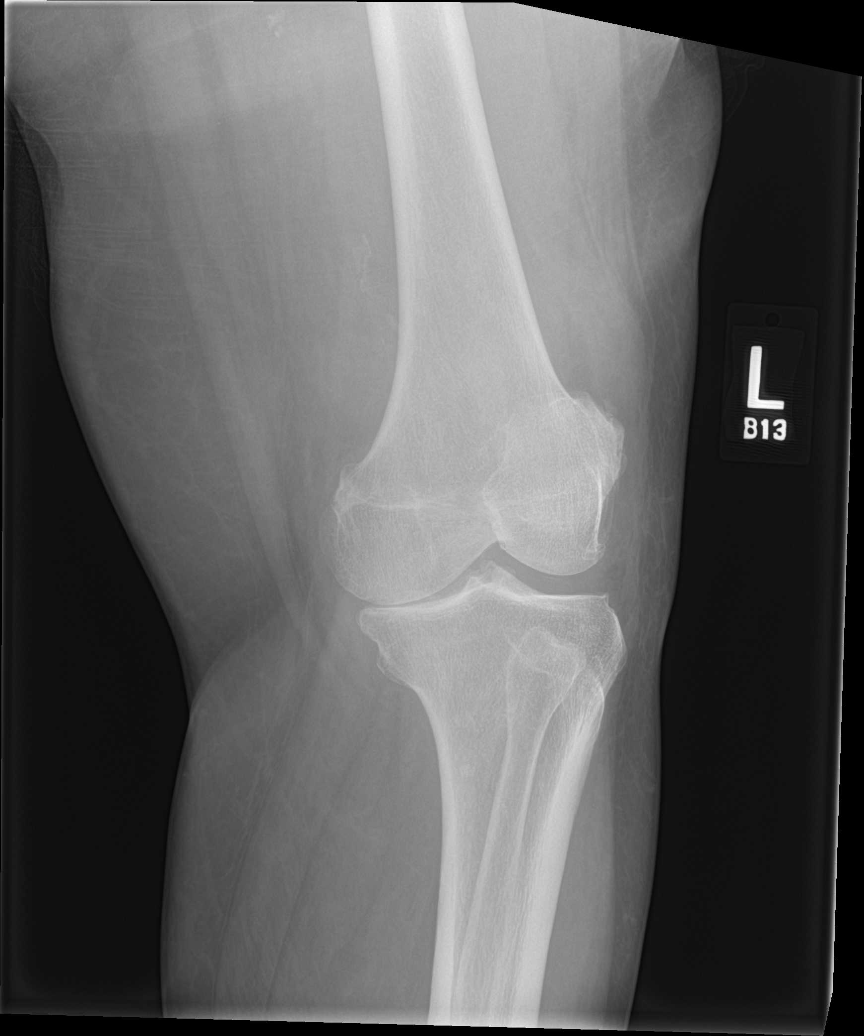

[knee obl (2 of 2)]
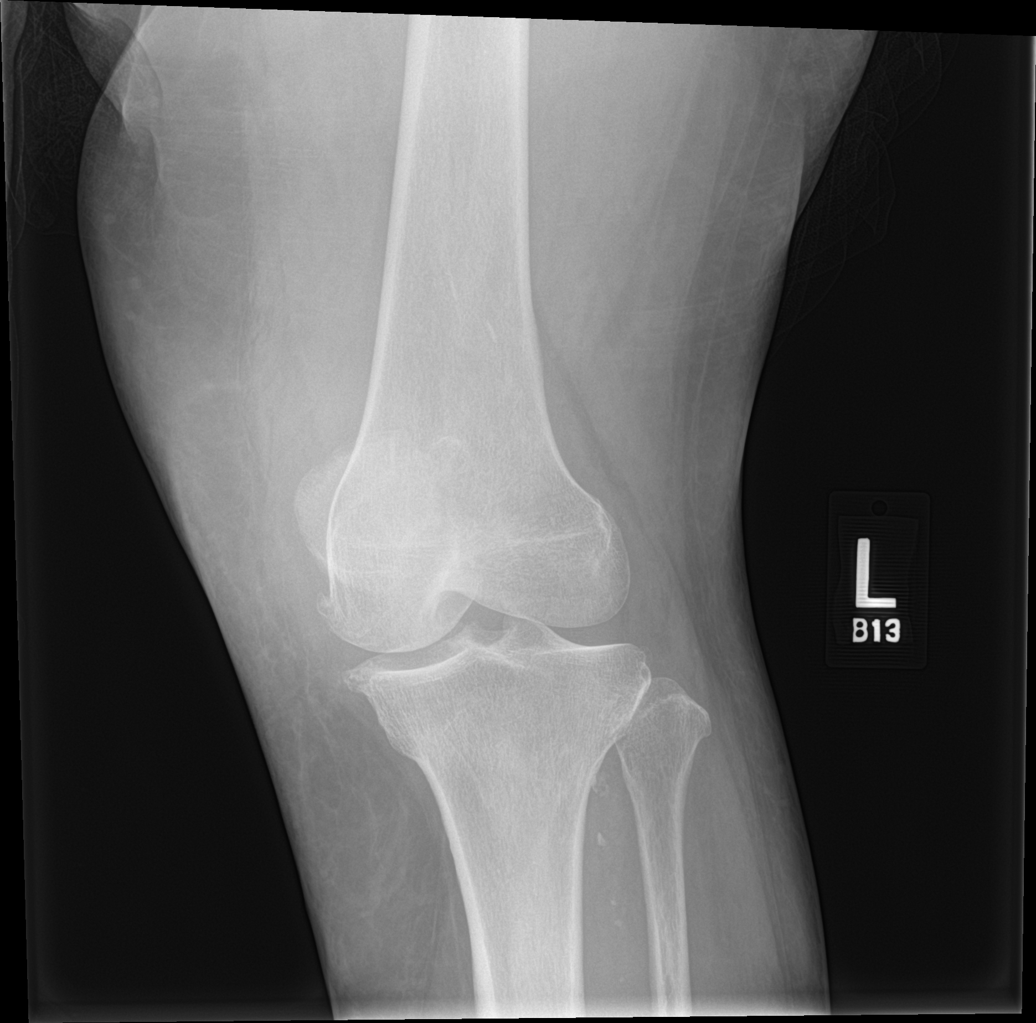

[5 of 5 positions shown; findings below may reference images not displayed]

FINDINGS: Moderate joint space narrowing in the medial knee compartment with
associated osteophytosis. Evidence for moderate sized suprapatellar
joint effusion. Spurring and degenerative changes at the
patellofemoral compartment of the knee. Atherosclerotic
calcifications are present. Negative for fracture or dislocation.
IMPRESSION: 1. No acute bone abnormality to the left knee.
2. Osteoarthritis in left knee, particularly along the medial knee
compartment. Evidence for a suprapatellar joint effusion.
